# Patient Record
Sex: Female | Born: 1945 | Race: White | Hispanic: No | Smoking: Former smoker
Health system: Southern US, Community
[De-identification: ages and names within clinical notes are randomized; demographics above are authoritative.]

## PROBLEM LIST (undated history)

## (undated) DIAGNOSIS — K5792 Diverticulitis of intestine, part unspecified, without perforation or abscess without bleeding: Secondary | ICD-10-CM

## (undated) DIAGNOSIS — J302 Other seasonal allergic rhinitis: Secondary | ICD-10-CM

## (undated) DIAGNOSIS — I10 Essential (primary) hypertension: Secondary | ICD-10-CM

## (undated) DIAGNOSIS — F329 Major depressive disorder, single episode, unspecified: Secondary | ICD-10-CM

## (undated) DIAGNOSIS — K529 Noninfective gastroenteritis and colitis, unspecified: Secondary | ICD-10-CM

## (undated) DIAGNOSIS — F32A Depression, unspecified: Secondary | ICD-10-CM

## (undated) DIAGNOSIS — M1990 Unspecified osteoarthritis, unspecified site: Secondary | ICD-10-CM

## (undated) HISTORY — PX: CATARACT EXTRACTION, BILATERAL: SHX1313

## (undated) HISTORY — DX: Depression, unspecified: F32.A

## (undated) HISTORY — PX: ABDOMINAL HYSTERECTOMY: SHX81

## (undated) HISTORY — PX: CHOLECYSTECTOMY: SHX55

## (undated) HISTORY — DX: Diverticulitis of intestine, part unspecified, without perforation or abscess without bleeding: K57.92

## (undated) HISTORY — DX: Other seasonal allergic rhinitis: J30.2

## (undated) HISTORY — DX: Unspecified osteoarthritis, unspecified site: M19.90

## (undated) HISTORY — DX: Major depressive disorder, single episode, unspecified: F32.9

## (undated) HISTORY — PX: TONSILLECTOMY AND ADENOIDECTOMY: SHX28

---

## 2011-10-02 DIAGNOSIS — M150 Primary generalized (osteo)arthritis: Secondary | ICD-10-CM | POA: Insufficient documentation

## 2012-06-10 DIAGNOSIS — M8589 Other specified disorders of bone density and structure, multiple sites: Secondary | ICD-10-CM | POA: Insufficient documentation

## 2014-07-29 DIAGNOSIS — H2512 Age-related nuclear cataract, left eye: Secondary | ICD-10-CM | POA: Insufficient documentation

## 2016-09-01 HISTORY — PX: BACK SURGERY: SHX140

## 2017-01-31 ENCOUNTER — Emergency Department (HOSPITAL_BASED_OUTPATIENT_CLINIC_OR_DEPARTMENT_OTHER)
Admission: EM | Admit: 2017-01-31 | Discharge: 2017-02-01 | Disposition: A | Discharge status: Home / Self Care | Payer: Medicare Other | Attending: Emergency Medicine | Admitting: Emergency Medicine

## 2017-01-31 ENCOUNTER — Encounter (HOSPITAL_BASED_OUTPATIENT_CLINIC_OR_DEPARTMENT_OTHER): Payer: Self-pay

## 2017-01-31 DIAGNOSIS — I10 Essential (primary) hypertension: Secondary | ICD-10-CM | POA: Diagnosis not present

## 2017-01-31 DIAGNOSIS — Y9389 Activity, other specified: Secondary | ICD-10-CM | POA: Diagnosis not present

## 2017-01-31 DIAGNOSIS — Z23 Encounter for immunization: Secondary | ICD-10-CM | POA: Diagnosis not present

## 2017-01-31 DIAGNOSIS — F17200 Nicotine dependence, unspecified, uncomplicated: Secondary | ICD-10-CM | POA: Diagnosis not present

## 2017-01-31 DIAGNOSIS — W01198A Fall on same level from slipping, tripping and stumbling with subsequent striking against other object, initial encounter: Secondary | ICD-10-CM | POA: Diagnosis not present

## 2017-01-31 DIAGNOSIS — S0101XA Laceration without foreign body of scalp, initial encounter: Secondary | ICD-10-CM | POA: Diagnosis not present

## 2017-01-31 DIAGNOSIS — Y9289 Other specified places as the place of occurrence of the external cause: Secondary | ICD-10-CM | POA: Diagnosis not present

## 2017-01-31 DIAGNOSIS — Y999 Unspecified external cause status: Secondary | ICD-10-CM | POA: Diagnosis not present

## 2017-01-31 DIAGNOSIS — S0990XA Unspecified injury of head, initial encounter: Secondary | ICD-10-CM | POA: Diagnosis present

## 2017-01-31 HISTORY — DX: Essential (primary) hypertension: I10

## 2017-01-31 NOTE — ED Notes (Signed)
Bleeding controlled in triage 

## 2017-01-31 NOTE — ED Triage Notes (Signed)
Pt slipped/fell approx 945pm-hit back of head on dresser corner-lac noted-no bleeding-no LOC-presents to triage from w/c-NAD

## 2017-02-01 DIAGNOSIS — S0101XA Laceration without foreign body of scalp, initial encounter: Secondary | ICD-10-CM | POA: Diagnosis not present

## 2017-02-01 MED ORDER — TETANUS-DIPHTH-ACELL PERTUSSIS 5-2.5-18.5 LF-MCG/0.5 IM SUSP
0.5000 mL | Freq: Once | INTRAMUSCULAR | Status: AC
  Administered 2017-02-01: 0.5 mL via INTRAMUSCULAR
  Filled 2017-02-01: qty 0.5

## 2017-02-01 NOTE — ED Provider Notes (Signed)
[facility]   CSN: [id number] Arrival date & time: 01/31/17  2244     History   Chief Complaint Chief Complaint  Patient presents with  . Head Laceration    HPI Ana Johnston is a 71 y.o. female.  Patient is a 71 year old female with no significant past medical history presenting for evaluation of a fall. She reports living and falling earlier this evening hitting the back of her head on the corner of the nightstand. She has a laceration of the posterior scalp. She denies any loss of consciousness, neck pain or headache. She denies any other injury in the fall. Last tetanus shot is unknown.   The history is provided by the patient.  Head Laceration  This is a new problem. The current episode started 1 to 2 hours ago. The problem occurs constantly. The problem has not changed since onset.Pertinent negatives include no headaches. Nothing aggravates the symptoms. Nothing relieves the symptoms. She has tried nothing for the symptoms.    Past Medical History:  Diagnosis Date  . Hypertension     There are no active problems to display for this patient.   Past Surgical History:  Procedure Laterality Date  . ABDOMINAL HYSTERECTOMY    . BACK SURGERY    . CHOLECYSTECTOMY      OB History    No data available       Home Medications    Prior to Admission medications   Medication Sig Start Date End Date Taking? Authorizing Provider  UNKNOWN TO PATIENT BP MED, SLEEPING PILLS   Yes [provider]    Family History No family history on file.  Social History Social History  Substance Use Topics  . Smoking status: Current Every Day Smoker  . Smokeless tobacco: Never Used  . Alcohol use Yes     Comment: occ     Allergies   Patient has no known allergies.   Review of Systems Review of Systems  Neurological: Negative for headaches.  All other systems reviewed and are negative.    Physical Exam Updated Vital Signs BP 128/85  (BP Location: Right Arm)   Pulse 78   Temp 97.8 F (36.6 C) (Oral)   Resp 14   Ht 5\' 1"  (1.549 m)   Wt 59 kg (130 lb)   SpO2 97%   BMI 24.56 kg/m   Physical Exam  Constitutional: She is oriented to person, place, and time. She appears well-developed and well-nourished. No distress.  HENT:  Head: Atraumatic.  Mouth/Throat: Oropharynx is clear and moist.  There is a 2.5 cm laceration to the occipital region just to the left of the occiput.  Eyes: Pupils are equal, round, and reactive to light. EOM are normal.  Neck: Normal range of motion. Neck supple.  There is no cervical spine tenderness or step-off. She has painless range of motion in all directions.  Pulmonary/Chest: Effort normal.  Musculoskeletal: Normal range of motion.  Neurological: She is alert and oriented to person, place, and time. No cranial nerve deficit. She exhibits normal muscle tone. Coordination normal.  Skin: Skin is warm and dry. She is not diaphoretic.  Nursing note and vitals reviewed.    ED Treatments / Results  Labs (all labs ordered are listed, but only abnormal results are displayed) Labs Reviewed - No data to display  EKG  EKG Interpretation None       Radiology No results found. LACERATION REPAIR Performed by: Geoffery LyonseLo, Kristapher [place] Authorized by: Geoffery LyonseLo, Jodi Kappes Consent:  Verbal consent obtained. Risks and benefits: risks, benefits and alternatives were discussed Consent given by: patient Patient identity confirmed: provided demographic data Prepped and Draped in normal sterile fashion Wound explored  Laceration Location: Posterior scalp  Laceration Length: 2.5 cm  No Foreign Bodies seen or palpated  Anesthesia: local infiltration  Local anesthetic: lidocaine 1 % without epinephrine  Anesthetic total: 2 ml  Irrigation method: syringe Amount of cleaning: standard  Skin closure: Staples   Number of sutures: 2   Technique: Staples   Patient tolerance: Patient tolerated the  procedure well with no immediate complications.  Procedures Procedures (including critical care time)  Medications Ordered in ED Medications  Tdap (BOOSTRIX) injection 0.5 mL (not administered)     Initial Impression / Assessment and Plan / ED Course  I have reviewed the triage vital signs and the nursing notes.  Pertinent labs & imaging results that were available during my care of the patient were reviewed by me and considered in my medical decision making (see chart for details).  Laceration repaired as described above. There is no loss of consciousness and no focal finding on neurologic exam. The patient takes no blood thinners. I see no indication for imaging. She will be discharged with local wound care and staple removal in 5 days.  Final Clinical Impressions(s) / ED Diagnoses   Final diagnoses:  None    New Prescriptions New Prescriptions   No medications on file     Geoffery Lyons, MD 02/01/17 0010

## 2017-02-01 NOTE — Discharge Instructions (Signed)
Follow-up with your primary Dr. in 5 days for staple removal.  Return to the emergency department in the meantime if you develop worsening headache, confusion, seizure activity, or other new and concerning symptoms.

## 2017-05-25 ENCOUNTER — Emergency Department (HOSPITAL_BASED_OUTPATIENT_CLINIC_OR_DEPARTMENT_OTHER): Payer: Medicare Other

## 2017-05-25 ENCOUNTER — Other Ambulatory Visit: Payer: Self-pay

## 2017-05-25 ENCOUNTER — Encounter (HOSPITAL_BASED_OUTPATIENT_CLINIC_OR_DEPARTMENT_OTHER): Payer: Self-pay

## 2017-05-25 ENCOUNTER — Emergency Department (HOSPITAL_BASED_OUTPATIENT_CLINIC_OR_DEPARTMENT_OTHER)
Admission: EM | Admit: 2017-05-25 | Discharge: 2017-05-25 | Disposition: A | Discharge status: Home / Self Care | Payer: Medicare Other | Attending: Emergency Medicine | Admitting: Emergency Medicine

## 2017-05-25 DIAGNOSIS — I959 Hypotension, unspecified: Secondary | ICD-10-CM

## 2017-05-25 DIAGNOSIS — W19XXXA Unspecified fall, initial encounter: Secondary | ICD-10-CM

## 2017-05-25 DIAGNOSIS — W0110XA Fall on same level from slipping, tripping and stumbling with subsequent striking against unspecified object, initial encounter: Secondary | ICD-10-CM | POA: Insufficient documentation

## 2017-05-25 DIAGNOSIS — Y92003 Bedroom of unspecified non-institutional (private) residence as the place of occurrence of the external cause: Secondary | ICD-10-CM | POA: Diagnosis not present

## 2017-05-25 DIAGNOSIS — Y939 Activity, unspecified: Secondary | ICD-10-CM | POA: Diagnosis not present

## 2017-05-25 DIAGNOSIS — S01111A Laceration without foreign body of right eyelid and periocular area, initial encounter: Secondary | ICD-10-CM | POA: Insufficient documentation

## 2017-05-25 DIAGNOSIS — Y999 Unspecified external cause status: Secondary | ICD-10-CM | POA: Insufficient documentation

## 2017-05-25 DIAGNOSIS — I10 Essential (primary) hypertension: Secondary | ICD-10-CM | POA: Diagnosis not present

## 2017-05-25 DIAGNOSIS — S0990XA Unspecified injury of head, initial encounter: Secondary | ICD-10-CM

## 2017-05-25 DIAGNOSIS — F17200 Nicotine dependence, unspecified, uncomplicated: Secondary | ICD-10-CM | POA: Diagnosis not present

## 2017-05-25 LAB — COMPREHENSIVE METABOLIC PANEL
ALT: 29 U/L (ref 14–54)
ANION GAP: 14 (ref 5–15)
AST: 37 U/L (ref 15–41)
Albumin: 4.1 g/dL (ref 3.5–5.0)
Alkaline Phosphatase: 91 U/L (ref 38–126)
BILIRUBIN TOTAL: 0.5 mg/dL (ref 0.3–1.2)
BUN: 11 mg/dL (ref 6–20)
CHLORIDE: 105 mmol/L (ref 101–111)
CO2: 20 mmol/L — ABNORMAL LOW (ref 22–32)
Calcium: 9.4 mg/dL (ref 8.9–10.3)
Creatinine, Ser: 0.81 mg/dL (ref 0.44–1.00)
Glucose, Bld: 84 mg/dL (ref 65–99)
POTASSIUM: 3.6 mmol/L (ref 3.5–5.1)
Sodium: 139 mmol/L (ref 135–145)
TOTAL PROTEIN: 7.1 g/dL (ref 6.5–8.1)

## 2017-05-25 LAB — DIFFERENTIAL
BASOS ABS: 0 10*3/uL (ref 0.0–0.1)
Basophils Relative: 1 %
EOS ABS: 0.2 10*3/uL (ref 0.0–0.7)
Eosinophils Relative: 3 %
LYMPHS ABS: 2.4 10*3/uL (ref 0.7–4.0)
Lymphocytes Relative: 38 %
Monocytes Absolute: 0.6 10*3/uL (ref 0.1–1.0)
Monocytes Relative: 10 %
NEUTROS ABS: 3 10*3/uL (ref 1.7–7.7)
NEUTROS PCT: 48 %

## 2017-05-25 LAB — CBC
HCT: 40.4 % (ref 36.0–46.0)
HEMOGLOBIN: 13.9 g/dL (ref 12.0–15.0)
MCH: 33.9 pg (ref 26.0–34.0)
MCHC: 34.4 g/dL (ref 30.0–36.0)
MCV: 98.5 fL (ref 78.0–100.0)
Platelets: 262 10*3/uL (ref 150–400)
RBC: 4.1 MIL/uL (ref 3.87–5.11)
RDW: 13.1 % (ref 11.5–15.5)
WBC: 6.2 10*3/uL (ref 4.0–10.5)

## 2017-05-25 LAB — URINALYSIS, ROUTINE W REFLEX MICROSCOPIC
Bilirubin Urine: NEGATIVE
Glucose, UA: NEGATIVE mg/dL
HGB URINE DIPSTICK: NEGATIVE
Ketones, ur: NEGATIVE mg/dL
LEUKOCYTES UA: NEGATIVE
NITRITE: NEGATIVE
PROTEIN: NEGATIVE mg/dL
pH: 6 (ref 5.0–8.0)

## 2017-05-25 LAB — TROPONIN I

## 2017-05-25 MED ORDER — LOPERAMIDE HCL 2 MG PO CAPS: 2.0000 mg | ORAL_CAPSULE | Freq: Four times a day (QID) | ORAL | 0 refills | Status: DC | PRN

## 2017-05-25 MED ORDER — SODIUM CHLORIDE 0.9 % IV BOLUS (SEPSIS)
1000.0000 mL | Freq: Once | INTRAVENOUS | Status: AC
  Administered 2017-05-25: 1000 mL via INTRAVENOUS

## 2017-05-25 MED ORDER — LIDOCAINE HCL (PF) 1 % IJ SOLN
5.0000 mL | Freq: Once | INTRAMUSCULAR | Status: AC
  Administered 2017-05-25: 5 mL
  Filled 2017-05-25: qty 5

## 2017-05-25 NOTE — ED Notes (Signed)
Patient transported to CT 

## 2017-05-25 NOTE — ED Provider Notes (Signed)
Emergency Department Provider Note   I have reviewed the triage vital signs and the nursing notes.   HISTORY  Chief Complaint Fall   HPI Ana Johnston is a 71 y.o. female with PMH of HTN presents to the emergency department for evaluation after mechanical fall with head and face trauma.  Patient had taken some of her nighttime medications including melatonin, trazodone, gabapentin.  She was bending over to pick up her small dog when she lost her balance and fell to the side.  States that she struck her face and head on the nearby bed.  She sustained a laceration to the area with questionable loss of consciousness.  Patient denies any pain in the arms or legs.  She has been feeling fatigued for most of the day and has had some return of diarrhea symptoms over the past 2 days.  Patient states she has a history of colitis and sometimes get flares of diarrhea.  Again, she denies pain in the abdomen, nausea, vomiting. No other recent medication changes.   Caregiver also notes that the patient has been accidentally taking too much Melatonin along with her other sleeping medications.    Past Medical History:  Diagnosis Date  . Hypertension     There are no active problems to display for this patient.   Past Surgical History:  Procedure Laterality Date  . ABDOMINAL HYSTERECTOMY    . BACK SURGERY    . CHOLECYSTECTOMY      Current Outpatient Rx  . Order #: [id number] Class: Historical Med  . Order #: [id number] Class: Historical Med  . Order #: [id number] Class: Historical Med  . Order #: [id number] Class: Historical Med  . Order #: [id number] Class: Historical Med  . Order #: [id number] Class: Historical Med  . Order #: [id number] Class: Historical Med  . Order #: [id number] Class: Historical Med  . Order #: [id number] Class: Print  . Order #: [id number] Class: Historical Med    Allergies Patient has no known allergies.  History reviewed. No pertinent family history.  Social History Social  History   Tobacco Use  . Smoking status: Current Every Day Smoker  . Smokeless tobacco: Never Used  Substance Use Topics  . Alcohol use: Yes    Comment: occ  . Drug use: No    Review of Systems  Constitutional: No fever/chills. Positive drowsiness today.  Eyes: No visual changes. ENT: No sore throat. Cardiovascular: Denies chest pain. Respiratory: Denies shortness of breath. Gastrointestinal: No abdominal pain.  No nausea, no vomiting.  No diarrhea.  No constipation. Genitourinary: Negative for dysuria. Musculoskeletal: Negative for back pain. Skin: Negative for rash. Laceration to the right forehead. Bruising to the right cheek.  Neurological: Negative for headaches, focal weakness or numbness.  10-point ROS otherwise negative.  ____________________________________________   PHYSICAL EXAM:  VITAL SIGNS: ED Triage Vitals  Enc Vitals Group     BP 05/25/17 1930 (!) 86/64     Pulse Rate 05/25/17 1930 64     Resp 05/25/17 1930 18     Temp 05/25/17 1930 (!) 97.5 F (36.4 C)     Temp Source 05/25/17 1930 Oral     SpO2 05/25/17 1930 98 %     Pain Score 05/25/17 1931 6   Constitutional: Alert and oriented. Well appearing and in no acute distress. Eyes: Conjunctivae are normal. PERRL. EOMI. Head: Right forehead/eyebrow laceration. No scalp hematoma or addition injury.  Nose: No congestion/rhinnorhea. Mouth/Throat: Mucous membranes are moist.  Oropharynx non-erythematous. No dental injury noted.  Neck: No stridor. No cervical  spine tenderness to palpation. Cardiovascular: Normal rate, regular rhythm. Good peripheral circulation. Grossly normal heart sounds.   Respiratory: Normal respiratory effort.  No retractions. Lungs CTAB. Gastrointestinal: Soft and nontender. No distention.  Musculoskeletal: No lower extremity tenderness nor edema. No gross deformities of extremities. Neurologic:  Normal speech and language. No gross focal neurologic deficits are appreciated.  Skin:   Skin is warm and dry. Laceration and ecchymosis to the right cheek noted above.    ____________________________________________   LABS (all labs ordered are listed, but only abnormal results are displayed)  Labs Reviewed  COMPREHENSIVE METABOLIC PANEL - Abnormal; Notable for the following components:      Result Value   CO2 20 (*)    All other components within normal limits  URINALYSIS, ROUTINE W REFLEX MICROSCOPIC - Abnormal; Notable for the following components:   Specific Gravity, Urine <1.005 (*)    All other components within normal limits  TROPONIN I  CBC  DIFFERENTIAL  CBC WITH DIFFERENTIAL/PLATELET   ____________________________________________  EKG   EKG Interpretation  Date/Time:  Sunday May 25 2017 20:01:28 EST Ventricular Rate:  71 PR Interval:    QRS Duration: 99 QT Interval:  476 QTC Calculation: 518 R Axis:   -25 Text Interpretation:  Sinus rhythm Anterior infarct, old Prolonged QT interval No STEMI.  Confirmed by Alona Bene [phone]) on 05/25/2017 8:03:21 PM Also confirmed by Alona Bene [phone]), editor Elita Quick [phone])  on 05/26/2017 6:50:46 AM       ____________________________________________  RADIOLOGY  Ct Head Wo Contrast  Result Date: 05/25/2017 CLINICAL DATA:  Larey Seat out of bed injuring right-sided head and face. EXAM: CT HEAD WITHOUT CONTRAST CT MAXILLOFACIAL WITHOUT CONTRAST TECHNIQUE: Multidetector CT imaging of the head and maxillofacial structures were performed using the standard protocol without intravenous contrast. Multiplanar CT image reconstructions of the maxillofacial structures were also generated. COMPARISON:  None. FINDINGS: CT HEAD FINDINGS Brain: No evidence of acute infarction, hemorrhage, hydrocephalus, extra-axial collection or mass lesion/mass effect. Vascular: No hyperdense vessel or unexpected calcification. Skull: Normal. Negative for fracture or focal lesion. Other: None. CT MAXILLOFACIAL FINDINGS Osseous:  No acute fracture. Orbits: Negative. No traumatic or inflammatory finding. Sinuses: Clear.  Mild deviation of the nasal septum to the left. Soft tissues: Mild soft tissue swelling over the anterolateral right mid face. IMPRESSION: No acute brain injury. No acute facial bone fracture. Minimal soft tissue swelling over the anterolateral right mid face. Electronically Signed   By: Elberta Fortis M.D.   On: 05/25/2017 20:33   Ct Maxillofacial Wo Contrast  Result Date: 05/25/2017 CLINICAL DATA:  Larey Seat out of bed injuring right-sided head and face. EXAM: CT HEAD WITHOUT CONTRAST CT MAXILLOFACIAL WITHOUT CONTRAST TECHNIQUE: Multidetector CT imaging of the head and maxillofacial structures were performed using the standard protocol without intravenous contrast. Multiplanar CT image reconstructions of the maxillofacial structures were also generated. COMPARISON:  None. FINDINGS: CT HEAD FINDINGS Brain: No evidence of acute infarction, hemorrhage, hydrocephalus, extra-axial collection or mass lesion/mass effect. Vascular: No hyperdense vessel or unexpected calcification. Skull: Normal. Negative for fracture or focal lesion. Other: None. CT MAXILLOFACIAL FINDINGS Osseous: No acute fracture. Orbits: Negative. No traumatic or inflammatory finding. Sinuses: Clear.  Mild deviation of the nasal septum to the left. Soft tissues: Mild soft tissue swelling over the anterolateral right mid face. IMPRESSION: No acute brain injury. No acute facial bone fracture. Minimal soft tissue swelling over the anterolateral right mid face. Electronically Signed   By: Elberta Fortis M.D.   On: 05/25/2017  20:33    ____________________________________________   PROCEDURES  Procedure(s) performed:   Marland Kitchen.Marland Kitchen.Laceration Repair Date/Time: 05/26/2017 9:14 AM Performed by: Maia PlanLong, Jovie Swanner G, MD Authorized by: Maia PlanLong, Galo Sayed G, MD   Consent:    Consent obtained:  Verbal   Consent given by:  Patient   Risks discussed:  Infection, need for additional  repair, nerve damage, poor wound healing, poor cosmetic result, pain, retained foreign body, tendon damage and vascular damage   Alternatives discussed:  No treatment Anesthesia (see MAR for exact dosages):    Anesthesia method:  Local infiltration   Local anesthetic:  Lidocaine 1% w/o epi Laceration details:    Location:  Face   Face location:  R eyebrow   Length (cm):  3 Repair type:    Repair type:  Simple Pre-procedure details:    Preparation:  Patient was prepped and draped in usual sterile fashion and imaging obtained to evaluate for foreign bodies Exploration:    Hemostasis achieved with:  Direct pressure   Wound exploration: entire depth of wound probed and visualized     Wound extent: no foreign bodies/material noted, no muscle damage noted, no nerve damage noted, no underlying fracture noted and no vascular damage noted     Contaminated: no   Treatment:    Area cleansed with:  Betadine and saline   Amount of cleaning:  Standard   Visualized foreign bodies/material removed: no   Skin repair:    Repair method:  Sutures   Suture size:  5-0   Suture material:  Prolene   Suture technique:  Simple interrupted   Number of sutures:  5 Approximation:    Approximation:  Close   Vermilion border: well-aligned   Post-procedure details:    Dressing:  Open (no dressing)   Patient tolerance of procedure:  Tolerated well, no immediate complications     ____________________________________________   INITIAL IMPRESSION / ASSESSMENT AND PLAN / ED COURSE  Pertinent labs & imaging results that were available during my care of the patient were reviewed by me and considered in my medical decision making (see chart for details).  Patient presents emergency department for evaluation after mechanical fall.  She has a small laceration over the right eyebrow.  There was questionable loss of consciousness.  Patient also with some bruising over the right cheek.  Been feeling fatigued for  most the day and her initial blood pressure is low.  Repeat blood pressure without intervention shows improved BP. Plan for IVF, labs, UA, and CT imaging.   BP improving with IVF. Suspect mild dehydration with diarrhea returning. Will start immodium to control symptoms. No abdominal pain or tenderness on exam to require additional imaging. Laceration repaired as above. Plan for return for suture removal in 5-7 days.   At this time, I do not feel there is any life-threatening condition present. I have reviewed and discussed all results (EKG, imaging, lab, urine as appropriate), exam findings with patient. I have reviewed nursing notes and appropriate previous records.  I feel the patient is safe to be discharged home without further emergent workup. Discussed usual and customary return precautions. Patient and family (if present) verbalize understanding and are comfortable with this plan.  Patient will follow-up with their primary care provider. If they do not have a primary care provider, information for follow-up has been provided to them. All questions have been answered.  ____________________________________________  FINAL CLINICAL IMPRESSION(S) / ED DIAGNOSES  Final diagnoses:  Fall, initial encounter  Injury of head, initial encounter  Hypotension, unspecified hypotension type     MEDICATIONS GIVEN DURING THIS VISIT:  Medications  sodium chloride 0.9 % bolus 1,000 mL (0 mLs Intravenous Stopped 05/25/17 2143)  lidocaine (PF) (XYLOCAINE) 1 % injection 5 mL (5 mLs Infiltration Given 05/25/17 2024)     NEW OUTPATIENT MEDICATIONS STARTED DURING THIS VISIT:  Immodium   Note:  This document was prepared using Dragon voice recognition software and may include unintentional dictation errors.  Alona Bene, MD Emergency Medicine    Aamilah Augenstein, Arlyss Repress, MD 05/26/17 [phone]

## 2017-05-25 NOTE — Discharge Instructions (Signed)
You were seen in the Emergency Department (ED) today for a head injury.  Based on your evaluation, you may have sustained a concussion (or bruise) to your brain.  If you had a CT scan done, it did not show any evidence of serious injury or bleeding.    Symptoms to expect from a concussion include nausea, mild to moderate headache, difficulty concentrating or sleeping, and mild lightheadedness.  These symptoms should improve over the next few days to weeks, but it may take many weeks before you feel back to normal.  Return to the emergency department or follow-up with your primary care doctor if your symptoms are not improving over this time.  Signs of a more serious head injury include vomiting, severe headache, excessive sleepiness or confusion, and weakness or numbness in your face, arms or legs.  Return immediately to the Emergency Department if you experience any of these more concerning symptoms.    Rest, avoid strenuous physical or mental activity, and avoid activities that could potentially result in another head injury until all your symptoms from this head injury are completely resolved for at least 2-3 weeks.  You may take ibuprofen or acetaminophen over the counter according to label instructions for mild headache or scalp soreness.  

## 2017-05-25 NOTE — ED Triage Notes (Signed)
Pt reports fall just PTA - laceration noted to right side of head/right facial discoloration - pt denies LOC, denies being on anticoagulation. PT states that she was trying to pick up her dog to place her dog into the bed when she lost her balance and fell forward. States she had already taken her medications for insomnia. Lives alone. No history of using ambulatory aides, but daughter states patient is always off balance. Pt is hypotensive during triage - reports she has been sleepy all day - denies dysuria, cough, or fever. States she took Melatonin 30mg , trazadone 100mg , and gabapentin 300mg  around 1800. Beeding controlled at this time.

## 2017-05-25 NOTE — ED Notes (Signed)
ED Provider at bedside. 

## 2017-06-02 ENCOUNTER — Other Ambulatory Visit: Payer: Self-pay | Admitting: Family Medicine

## 2017-06-24 ENCOUNTER — Encounter: Payer: Self-pay | Admitting: Neurology

## 2017-07-02 NOTE — Progress Notes (Signed)
Ana Johnston was seen today in the movement disorders clinic for neurologic consultation at the request of [name], Tor NettersSara M., MD.  The consultation is for the evaluation of fall and tremor.  The records that were made available to me were reviewed.  This patient is accompanied in the office by her son and daughter in law who supplements the history.  Patient was seen in the [facility] emergency room on May 25, 2017 after a mechanical fall.  She sustained a laceration to the right eyebrow, which required sutures.  She was bending over to pick her dog up when she sustained the fall.  Blood pressure in the emergency room was quite low and the patient thinks that that is probably the reason she fell.  Her blood pressure medications were subsequently decreased and patient states that she doesn't take them daily, and just prn.  Patient reports that tremor started after that.  She followed up with a nurse practitioner at [facility] and she started her on clonazepam for anxiety and nervousness.  She takes that at bedtime.  The patient subsequently saw Dr [name] and she was referred here.  She was also referred for MRI of the brain.  This was completed at [facility] on June 12, 2017.  I do not have films.  I do have a report.  This was reported to show mild small vessel disease, and she also had an MRI of the cervical spine the same date.  This demonstrated mild edema at the C3-C4 facet, likely degenerative in nature.  There were also effusions in the atlantooccipital joints, likely also degenerative.  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  There is multilevel degenerative changes.  There is no significant central canal stenosis per reports.  Again, I do not have films.     Tremor: Yes.    - started after fall  At rest or with activation?  With activation  When is it noted the most?  With writing/eating - intermittent per family  Fam hx of tremor?  No.  Located where?  Both hands  Affected by caffeine:   Unknown - doesn't drink caffeine  Affected by alcohol:  (2 - 10 oz glasses of wine per night) - daughter in law thinks decreases tremor but patient denies  Affected by stress:  Yes.    Affected by fatigue:  Possibly per daughter in law  Spills soup if on spoon:  Yes.    Spills glass of liquid if full:  May or may not    She denies burning in feet/legs.  She admits to falls.  She has not had a fall since 12/23.  No balance therapy.  Family thinks that deconditioning is big issue.  She is not diabetic.     ALLERGIES:  No Known Allergies  CURRENT MEDICATIONS:  Outpatient Encounter Medications as of 07/04/2017  Medication Sig  . amLODipine (NORVASC) 10 MG tablet Take 10 mg by mouth daily.  Marland Kitchen. buPROPion (WELLBUTRIN SR) 150 MG 12 hr tablet Take 150 mg by mouth 2 (two) times daily.  . calcium-vitamin D (OSCAL WITH D) 500-200 MG-UNIT tablet Take 1 tablet by mouth.  . Cholecalciferol (VITAMIN D3) 1000 units CHEW Chew by mouth.  . clonazePAM (KLONOPIN) 0.5 MG tablet Take 0.5 mg by mouth at bedtime.  . Melatonin 10 MG TABS Take 30 mg by mouth.  . montelukast (SINGULAIR) 10 MG tablet Take 10 mg by mouth daily.  . Omega-3 Fatty Acids (FISH OIL) 1200 MG CAPS  Take by mouth.  . TRAZODONE HCL PO Take 50 mg by mouth at bedtime.   Marland Kitchen venlafaxine (EFFEXOR) 75 MG tablet Take 75 mg by mouth daily.  . vitamin E 1000 UNIT capsule Take by mouth daily.  . [DISCONTINUED] gabapentin (NEURONTIN) 300 MG capsule Take 300 mg by mouth 3 (three) times daily.  . [DISCONTINUED] loperamide (IMODIUM) 2 MG capsule Take 1 capsule (2 mg total) by mouth 4 (four) times daily as needed for diarrhea or loose stools.  . [DISCONTINUED] UNKNOWN TO PATIENT BP MED, SLEEPING PILLS   No facility-administered encounter medications on file as of 07/04/2017.     PAST MEDICAL HISTORY:   Past Medical History:  Diagnosis Date  . Depression   . Hypertension   . Seasonal allergies     PAST SURGICAL HISTORY:   Past Surgical History:    Procedure Laterality Date  . ABDOMINAL HYSTERECTOMY    . BACK SURGERY  09/2016  . CATARACT EXTRACTION, BILATERAL    . CHOLECYSTECTOMY      SOCIAL HISTORY:   Social History   Socioeconomic History  . Marital status: Widowed    Spouse name: Not on file  . Number of children: Not on file  . Years of education: Not on file  . Highest education level: Not on file  Social Needs  . Financial resource strain: Not on file  . Food insecurity - worry: Not on file  . Food insecurity - inability: Not on file  . Transportation needs - medical: Not on file  . Transportation needs - non-medical: Not on file  Occupational History  . Not on file  Tobacco Use  . Smoking status: Current Every Day Smoker  . Smokeless tobacco: Never Used  . Tobacco comment: 1/2 ppd x 50+ years  Substance and Sexual Activity  . Alcohol use: Yes    Comment: 2 glasses of wine a day  . Drug use: No  . Sexual activity: Not on file  Other Topics Concern  . Not on file  Social History Narrative  . Not on file    FAMILY HISTORY:   Family Status  Relation Name Status  . Mother  Deceased  . Father  Deceased  . Sister  Alive  . Brother  Alive  . Son x2 Alive    ROS:  A complete 10 system review of systems was obtained and was unremarkable apart from what is mentioned above.  PHYSICAL EXAMINATION:    VITALS:   Vitals:   07/04/17 0948  BP: (!) 142/90  Pulse: 86  SpO2: 95%  Weight: 143 lb (64.9 kg)  Height: 5\' 1"  (1.549 m)    GEN:  The patient appears stated age and is in NAD. HEENT:  Normocephalic, atraumatic.  The mucous membranes are moist. The superficial temporal arteries are without ropiness or tenderness. CV:  RRR Lungs:  CTAB Neck/HEME:  There are no carotid bruits bilaterally.  Neurological examination:  Orientation: The patient is alert and oriented x3. Fund of knowledge is appropriate.  Recent and remote memory are intact.  Attention and concentration are normal.    Able to name  objects and repeat phrases. Cranial nerves: There is good facial symmetry. Pupils are equal round and reactive to light bilaterally. Fundoscopic exam reveals clear margins bilaterally. Extraocular muscles are intact. The visual fields are full to confrontational testing. The speech is fluent and clear. Soft palate rises symmetrically and there is no tongue deviation. Hearing is intact to conversational tone. Sensation: Sensation is intact  to light and pinprick throughout (facial, trunk, extremities).  She reports decreased pinprick in a stocking distribution.  She reports initially that vibration is stronger in the big toe than it is in the hands, but then reports that it dissipates faster in the foot than the hand.  There is no extinction with double simultaneous stimulation. There is no sensory dermatomal level identified. Motor: Strength is 5/5 in the bilateral upper and lower extremities.   Shoulder shrug is equal and symmetric.  There is no pronator drift. Deep tendon reflexes: Deep tendon reflexes are 2/4 at the bilateral biceps, triceps, brachioradialis, patella and achilles. Plantar responses are downgoing bilaterally.  Movement examination: Tone: There is normal tone in the bilateral upper extremities.  The tone in the lower extremities is normal.  Abnormal movements: There is no rest tremor.  There is no postural tremor.  There is no tremor with intention.  When given a weight, she has some tremor in the left hand, but it is somewhat nonphysiologic.  When asked to pour water from one glass to another, she also has a little bit of tremor, but is able to complete the task.  She has some difficulty with Archimedes spirals, but very little of that difficulty was because of true tremor. Coordination:  There is no decremation with RAM's, with any form of RAMS, including alternating supination and pronation of the forearm, hand opening and closing, finger taps, heel taps and toe taps.  She has trouble  with finger-nose-finger with the eyes closed, but consistently hits the same place on her eye despite repeated attempts with eyes closed.  With eyes open, she is able to hit the tip of the nose. Gait and Station: The patient is able to arise out of the chair without the use of her hands.  She walks normally down the hall.  She is unable to ambulate in a tandem fashion.  LABS:  Reviewed labs in Care everywhere Labs were done on June 12, 2017.  Sodium was 139, potassium 4.3, chloride 103, CO2 28, BUN 13, creatinine 0.85, glucose 93 (prior 134).  White blood cells were 6.8, hemoglobin 15.0, hematocrit 43.9 and platelets 246.  MCV was elevated at 99.4.  MCH was elevated at 33.9.  ASSESSMENT/PLAN:  1.  Tremor  -There is very little tremor on her examination today and when I did see tremor when given a weight, some of it appeared nonphysiologic.  She reported that tremor started acutely after a fall in December.  I reassured her I saw no evidence of Parkinson's disease.  I also told her that I would not recommend any medication for her for the treatment of tremor.  I do wonder if anxiety plays a significant role, and her family does not disagree.  I also wonder if there is a component of alcohol/alcohol withdrawal that we are seeing.  There is a complex effect that alcohol can have on tremor.  Chronic alcohol use can produce a tremor but discontinuation of the alcohol can also cause a tremulous state for quite some time.  We talked about the importance of weaning alcohol under medical supervision.   2.  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  We discussed safety associated with peripheral neuropathy.  We discussed balance therapy and she was willing to go.  -We will do labs to r/o reversible causes of peripheral neuropathy.  I do think alcohol plays a  role here in etiology.  3.   Cervical degenerative change  -I do not have her films and could only comment on the report.  Do not think that this is likely surgical in nature, but offered her a surgical opinion (denied).  She certainly has reasons to have neck pain, however.  4.  Tobacco abuse  -Currently smoking 1/2 packs/day    - Patient was informed of the dangers of tobacco abuse including stroke, cancer, and MI, as well as benefits of tobacco cessation.  - Patient trying to quit at this time.  - Approximately 3 mins were spent counseling patient cessation techniques, independent of other visit time. We discussed various methods to help quit smoking, including deciding on a date to quit, joining a support group, pharmacological agents  - Pt following with PCP to discuss progress since won't be following here regularly  5.  F/u prn.  Much greater than 50% of this visit was spent in counseling and coordinating care.  Total face to face time:  60 min    Cc:  Laqueta Due., MD

## 2017-07-04 ENCOUNTER — Ambulatory Visit (INDEPENDENT_AMBULATORY_CARE_PROVIDER_SITE_OTHER): Payer: Medicare Other | Admitting: Neurology

## 2017-07-04 ENCOUNTER — Other Ambulatory Visit: Payer: Medicare Other

## 2017-07-04 ENCOUNTER — Encounter: Payer: Self-pay | Admitting: Neurology

## 2017-07-04 VITALS — BP 142/90 | HR 86 | Ht 61.0 in | Wt 143.0 lb

## 2017-07-04 DIAGNOSIS — R251 Tremor, unspecified: Secondary | ICD-10-CM

## 2017-07-04 DIAGNOSIS — G629 Polyneuropathy, unspecified: Secondary | ICD-10-CM

## 2017-07-04 DIAGNOSIS — F1010 Alcohol abuse, uncomplicated: Secondary | ICD-10-CM

## 2017-07-04 DIAGNOSIS — D7589 Other specified diseases of blood and blood-forming organs: Secondary | ICD-10-CM

## 2017-07-04 DIAGNOSIS — F17210 Nicotine dependence, cigarettes, uncomplicated: Secondary | ICD-10-CM

## 2017-07-04 DIAGNOSIS — Z720 Tobacco use: Secondary | ICD-10-CM

## 2017-07-04 LAB — TSH: TSH: 1.44 m[IU]/L (ref 0.40–4.50)

## 2017-07-04 NOTE — Patient Instructions (Signed)
1. Your provider has requested that you have labwork completed today. Please go to [facility] ([id number]) on the second floor of this building before leaving the office today. You do not need to check in. If you are not called within 15 minutes please check with the front desk.   2. You have been referred to Physical Therapy. They will call you directly to schedule an appointment.  Please [phone] if you do not hear from them.

## 2017-07-09 LAB — PROTEIN ELECTROPHORESIS,RANDOM URN
ALBUMIN UR 24 HR ELECTRO: 30 %
Alpha-1-Globulin, U: 6 %
Alpha-2-Globulin, U: 19 %
Beta Globulin, U: 26 %
Creatinine, Urine: 114 mg/dL (ref 20–275)
Gamma Globulin, U: 20 %
Protein/Creat Ratio: 123 mg/g creat (ref 21–161)
Total Protein, Urine: 14 mg/dL (ref 5–24)

## 2017-07-09 LAB — PROTEIN ELECTROPHORESIS, SERUM
Albumin ELP: 3.9 g/dL (ref 3.8–4.8)
Alpha 1: 0.3 g/dL (ref 0.2–0.3)
Alpha 2: 0.8 g/dL (ref 0.5–0.9)
BETA 2: 0.3 g/dL (ref 0.2–0.5)
BETA GLOBULIN: 0.4 g/dL (ref 0.4–0.6)
GAMMA GLOBULIN: 0.8 g/dL (ref 0.8–1.7)
TOTAL PROTEIN: 6.5 g/dL (ref 6.1–8.1)

## 2017-07-09 LAB — IMMUNOFIXATION ELECTROPHORESIS
IGG (IMMUNOGLOBIN G), SERUM: 743 mg/dL (ref 694–1618)
IMMUNOFIX ELECTR INT: NOT DETECTED
IMMUNOGLOBULIN A: 212 mg/dL (ref 81–463)
IgM, Serum: 62 mg/dL (ref 48–271)

## 2017-07-09 LAB — VITAMIN B1: Vitamin B1 (Thiamine): <6 nmol/L — ABNORMAL LOW (ref 8–30)

## 2017-07-09 LAB — FOLATE: Folate: 5.9 ng/mL

## 2017-07-09 LAB — VITAMIN B12: VITAMIN B 12: 396 pg/mL (ref 200–1100)

## 2017-07-09 LAB — IMMUNOFIXATION INTE

## 2017-07-10 ENCOUNTER — Telehealth: Payer: Self-pay | Admitting: Neurology

## 2017-07-10 NOTE — Telephone Encounter (Signed)
Patient made aware.

## 2017-07-10 NOTE — Telephone Encounter (Signed)
-----   Message from Octaviano Battyebecca S Tat, DO sent at 07/10/2017  7:19 AM EST ----- Let pt know that thiamine is low (often from drinking EtOH).  Take supplement 100mg  daily

## 2017-07-15 ENCOUNTER — Ambulatory Visit: Payer: Medicare Other | Attending: Neurology | Admitting: Physical Therapy

## 2017-07-15 ENCOUNTER — Encounter: Payer: Self-pay | Admitting: Physical Therapy

## 2017-07-15 ENCOUNTER — Other Ambulatory Visit: Payer: Self-pay

## 2017-07-15 DIAGNOSIS — M6281 Muscle weakness (generalized): Secondary | ICD-10-CM | POA: Diagnosis present

## 2017-07-15 DIAGNOSIS — R2689 Other abnormalities of gait and mobility: Secondary | ICD-10-CM | POA: Diagnosis present

## 2017-07-15 DIAGNOSIS — R262 Difficulty in walking, not elsewhere classified: Secondary | ICD-10-CM

## 2017-07-15 NOTE — Therapy (Signed)
During this treatment session, the therapist was present, participating in and directing the treatment. [facility] [address] [place], [place], [address] Phone: [phone]   Fax:  [phone]  Physical Therapy Evaluation  Patient Details  Name: Ana Johnston MRN: [id number] Date of Birth: 1946-03-12 Referring Provider: Tat   Encounter Date: 07/15/2017  PT End of Session - 07/15/17 1130    Visit Number  1    Date for PT Re-Evaluation  09/12/17    PT Start Time  1045    PT Stop Time  1130    PT Time Calculation (min)  45 min    Activity Tolerance  Treatment limited secondary to medical complications (Comment)    Behavior During Therapy  [facility] for tasks assessed/performed       Past Medical History:  Diagnosis Date  . Depression   . Hypertension   . Seasonal allergies     Past Surgical History:  Procedure Laterality Date  . ABDOMINAL HYSTERECTOMY    . BACK SURGERY  09/2016  . CATARACT EXTRACTION, BILATERAL    . CHOLECYSTECTOMY      There were no vitals filed for this visit.   Subjective Assessment - 07/15/17 1047    Subjective  Pt. reports having a history of falls over the past two years but have become more recurrent recently. Pt. son reports pt. falling at least twice over the past 3 months. Pt. most recent fall was around Christmas, pt. went to pick dog up and had fallen and hit head on the side of the bed. Pt. history of fallls with injury as result hitting head off of the ground of side furniture, black eye, broke back. Pt. reports tremors starting after last fall but son thinks it's due to being taken off of a medication that she shouldn't have been taking initially. Pt. reports getting "dizzy spells" and describes that as being light headed and feeling like you're going to faint - pt. doesn't get this feeling consistently.     Patient is accompained by:  Family member    Limitations  Standing;Walking     Patient Stated Goals  increase balance and leg strength for independence    Currently in Pain?  Yes    Pain Score  0-No pain    Pain Onset  More than a month ago         [facility] PT Assessment - 07/15/17 0001      Assessment   Medical Diagnosis  balance and gait disturbance    Referring Provider  Tat    Prior Therapy  PT for vertigo in the past      Precautions   Precautions  None      Balance Screen   Has the patient fallen in the past 6 months  Yes    How many times?  4    Has the patient had a decrease in activity level because of a fear of falling?   Yes    Is the patient reluctant to leave their home because of a fear of falling?   No usually has son or son's wife with her      Home Environment   Additional Comments  2 steps going into the house (pt, has "difficulty if no railing to hang onto"), doesn't do any housework or anything due to an increase in LBP and unsteadiness, has trouble getting in and out of the shower due to having to lift legs ups  high, small dog at home      Prior Function   Level of Independence  Independent with basic ADLs    Vocation  Retired    Leisure  no normal exercise routine,       ROM / Strength   AROM / PROM / Strength  Strength      Strength   Strength Assessment Site  Knee;Hip;Ankle    Right/Left Hip  Right;Left    Right Hip Flexion  3+/5    Right Hip Extension  4-/5    Right Hip ABduction  3+/5    Right Hip ADduction  4-/5    Left Hip Flexion  3+/5    Left Hip Extension  4-/5    Left Hip ABduction  3+/5    Left Hip ADduction  4-/5    Right/Left Knee  Right;Left    Right Knee Flexion  3+/5    Right Knee Extension  4-/5    Left Knee Flexion  3+/5    Left Knee Extension  4-/5    Right/Left Ankle  Right;Left    Right Ankle Dorsiflexion  3+/5    Right Ankle Plantar Flexion  3+/5    Left Ankle Dorsiflexion  4-/5    Left Ankle Plantar Flexion  4-/5      Ambulation/Gait   Gait Comments  decrease stride length and cadence, decrease  hip/knee flexion, some shuffling especially with turns, has difficulty clearring feet sometimes particularly the R      Standardized Balance Assessment   Standardized Balance Assessment  Berg Balance Test;Timed Up and Go Test;Four Square Step Test;Five Times Sit to [employer] Test   Sit to Stand  Able to stand without using hands and stabilize independently    Standing Unsupported  Able to stand 2 minutes with supervision some swaying    Sitting with Back Unsupported but Feet Supported on Floor or Stool  Able to sit safely and securely 2 minutes can sit with back unsupported but increases LBP    Stand to Sit  Controls descent by using hands    Transfers  Able to transfer safely, definite need of hands    Standing Unsupported with Eyes Closed  Able to stand 10 seconds safely some swaying    Standing Ubsupported with Feet Together  Able to place feet together independently and stand 1 minute safely    From Standing, Reach Forward with Outstretched Arm  Can reach confidently >25 cm (10")    From Standing Position, Pick up Object from Floor  Able to pick up shoe safely and easily    From Standing Position, Turn to Look Behind Over each Shoulder  Looks behind one side only/other side shows less weight shift needing cues to not just turn head to side and look behind    Turn 360 Degrees  Able to turn 360 degrees safely one side only in 4 seconds or less feeling unsteady and dizzy    Standing Unsupported, Alternately Place Feet on Step/Stool  Able to stand independently and complete 8 steps >20 seconds trouble picking up R foot high enough, needs guard    Standing Unsupported, One Foot in Front  Needs help to step but can hold 15 seconds difficulty putting L in front of R    Standing on One Leg  Able to lift leg independently and hold equal to or more than 3 seconds    Total Score  45      Timed  Up and Go Test   Normal TUG (seconds)  17             Objective measurements  completed on examination: See above findings.              PT Education - 07/15/17 1129    Education provided  Yes    Education Details  HEP standing marching, ABD, ext    [employer]) Educated  Patient;Other (comment) son    Methods  Explanation;Demonstration;Handout    Comprehension  Verbalized understanding       PT Short Term Goals - 07/15/17 1138      PT SHORT TERM GOAL #1   Title  independent with initial HEP    Time  2    Period  Weeks    Status  New        PT Long Term Goals - 07/15/17 1139      PT LONG TERM GOAL #1   Title  improve BERG >52 for safety with gait and functional gait    Baseline  45    Time  8    Period  Weeks    Status  New      PT LONG TERM GOAL #2   Title  increase overal LE strength 4+/5 for functional gait    Time  8    Period  Weeks    Status  New      PT LONG TERM GOAL #3   Title  decrease TUG time to 13 seconds    Baseline  19 seconds    Time  8    Period  Weeks    Status  New      PT LONG TERM GOAL #4   Title  report no difficulty or limitation with ADLs    Time  8    Period  Weeks    Status  New             Plan - 07/15/17 1130    Clinical Impression Statement  Pt. has had about 4 falls in the past 6 months and increased feelings of being unsteady or 'dizzy'. Pt. has 'dizzy' spells when she feels light headed often but not every day or during a particular time/activity. Pt. has to sit down to don/doff clothing or groom and needs assistance to get in and out of the shower due to step going into the shower and feeling like she will fall on the smooth surface. Pt. has some fear of falling again and getting hurt but doesn't fear leaving home because her son or his wife is always with her. Pt. has noticable weakness of hips/knee/ankle DF and PF.  Pt. BERG=45 and TUG =19 seconds. Pt. has decrease stride length and cadence, decrease hip/knee flexion and has some difficulty clearing feet when walking particularly the R foot.  Pt., shuffles a little especially iwth turns and turns very slowly. Pt. has great difficulty with decreasing BOS with standing.    Clinical Presentation  Evolving    Clinical Decision Making  Low    Rehab Potential  Good    PT Frequency  2x / week    PT Duration  8 weeks    PT Treatment/Interventions  Gait training;Stair training;Therapeutic activities;Therapeutic exercise;Balance training;Patient/family education;Neuromuscular re-education    PT Next Visit Plan  start general LE strengthening    PT Home Exercise Plan  standing marching, hip ABD, hip ext.    Consulted and Agree with Plan of Care  Patient  Patient will benefit from skilled therapeutic intervention in order to improve the following deficits and impairments:  Abnormal gait, Impaired sensation, Decreased activity tolerance, Decreased endurance, Decreased strength, Difficulty walking, Decreased balance  Visit Diagnosis: Difficulty in walking, not elsewhere classified  Balance problem  Muscle weakness (generalized)     Problem List There are no active problems to display for this patient.   Blima Ledger SPT 07/15/2017, 11:44 AM  [facility] [address] [place], [place], [address] Phone: [phone]   Fax:  [phone]  Name: Ana Johnston MRN: [id number] Date of Birth: Mar 06, 1946

## 2017-07-22 ENCOUNTER — Ambulatory Visit: Payer: Medicare Other | Admitting: Physical Therapy

## 2017-07-22 DIAGNOSIS — R2689 Other abnormalities of gait and mobility: Secondary | ICD-10-CM

## 2017-07-22 DIAGNOSIS — R262 Difficulty in walking, not elsewhere classified: Secondary | ICD-10-CM | POA: Diagnosis not present

## 2017-07-22 DIAGNOSIS — M6281 Muscle weakness (generalized): Secondary | ICD-10-CM

## 2017-07-22 NOTE — Therapy (Signed)
[facility] [address] [place], [place], [address] Phone: [phone]   Fax:  [phone]  Physical Therapy Treatment  Patient Details  Name: Ana Johnston MRN: [id number] Date of Birth: [birth year] Referring Provider: Tat   Encounter Date: 07/22/2017  PT End of Session - 07/22/17 1353    Visit Number  2    Date for PT Re-Evaluation  09/12/17    PT Start Time  1310    PT Stop Time  1355    PT Time Calculation (min)  45 min       Past Medical History:  Diagnosis Date  . Depression   . Hypertension   . Seasonal allergies     Past Surgical History:  Procedure Laterality Date  . ABDOMINAL HYSTERECTOMY    . BACK SURGERY  09/2016  . CATARACT EXTRACTION, BILATERAL    . CHOLECYSTECTOMY      There were no vitals filed for this visit.  Subjective Assessment - 07/22/17 1313    Subjective  alittle shakey but okay today. family reports just joined [employer] and want to know what pt can safely do    Currently in Pain?  No/denies                      [facility] Adult PT Treatment/Exercise - 07/22/17 0001      High Level Balance   High Level Balance Activities  Side stepping;Braiding;Backward walking;Direction changes;Turns;Tandem walking;Marching forwards ball toss      Exercises   Exercises  Lumbar;Knee/Hip      Lumbar Exercises: Aerobic   Recumbent Bike  5 min    Nustep  L 5 5 min      Lumbar Exercises: Machines for Strengthening   Cybex Knee Extension  5# 2 sets 10    Cybex Knee Flexion  15 # 2 sets 10      Knee/Hip Exercises: Seated   Ball Squeeze  20    Clamshell with TheraBand  Green    Sit to [employer]  without UE support;10 reps             PT Education - 07/22/17 1330    Education provided  Yes    Education Details  knee ext,HS curl, hip abd/add plus BIKE at gym    Person(s) Educated  Patient;Child(ren)    Methods  Explanation;Demonstration;Verbal cues;Handout    Comprehension  Verbalized understanding;Returned demonstration       PT Short Term Goals - 07/22/17 1355      PT SHORT TERM GOAL #1   Title  independent with initial HEP    Status  Achieved        PT Long Term Goals - 07/15/17 1139      PT LONG TERM GOAL #1   Title  improve BERG >52 for safety with gait and functional gait    Baseline  45    Time  8    Period  Weeks    Status  New      PT LONG TERM GOAL #2   Title  increase overal LE strength 4+/5 for functional gait    Time  8    Period  Weeks    Status  New      PT LONG TERM GOAL #3   Title  decrease TUG time to 13 seconds    Baseline  19 seconds    Time  8    Period  Weeks  Status  New      PT LONG TERM GOAL #4   Title  report no difficulty or limitation with ADLs    Time  8    Period  Weeks    Status  New            Plan - 07/22/17 1353    Clinical Impression Statement  cuing needed with ex for correct tech and educ with family for gym. LOB with tandem so added to HEP. tolerated initiation of ex for balance and LE ex well    PT Treatment/Interventions  Gait training;Stair training;Therapeutic activities;Therapeutic exercise;Balance training;Patient/family education;Neuromuscular re-education    PT Next Visit Plan  progress general LE strengthening       Patient will benefit from skilled therapeutic intervention in order to improve the following deficits and impairments:  Abnormal gait, Impaired sensation, Decreased activity tolerance, Decreased endurance, Decreased strength, Difficulty walking, Decreased balance  Visit Diagnosis: Difficulty in walking, not elsewhere classified  Balance problem  Muscle weakness (generalized)     Problem List There are no active problems to display for this patient.   Naveya Ellerman,ANGIE PTA 07/22/2017, 1:56 PM  [facility] [address] [place], [place], [address] Phone: [phone]   Fax:   [phone]  Name: Ana Johnston MRN: [id number] Date of Birth: [birth year]

## 2017-07-25 ENCOUNTER — Ambulatory Visit: Payer: Medicare Other | Admitting: Physical Therapy

## 2017-07-25 DIAGNOSIS — R262 Difficulty in walking, not elsewhere classified: Secondary | ICD-10-CM

## 2017-07-25 DIAGNOSIS — R2689 Other abnormalities of gait and mobility: Secondary | ICD-10-CM

## 2017-07-25 DIAGNOSIS — M6281 Muscle weakness (generalized): Secondary | ICD-10-CM

## 2017-07-25 NOTE — Therapy (Signed)
[facility] [address] [place], [place], [address] Phone: [phone]   Fax:  [phone]  Physical Therapy Treatment  Patient Details  Name: Ana Johnston MRN: [id number] Date of Birth: 1946/04/26 Referring Provider: Tat   Encounter Date: 07/25/2017  PT End of Session - 07/25/17 1135    Visit Number  3    Date for PT Re-Evaluation  09/12/17    PT Start Time  1100    PT Stop Time  1140    PT Time Calculation (min)  40 min       Past Medical History:  Diagnosis Date  . Depression   . Hypertension   . Seasonal allergies     Past Surgical History:  Procedure Laterality Date  . ABDOMINAL HYSTERECTOMY    . BACK SURGERY  09/2016  . CATARACT EXTRACTION, BILATERAL    . CHOLECYSTECTOMY      There were no vitals filed for this visit.  Subjective Assessment - 07/25/17 1102    Subjective  went to gym 3 times and did well. no pain just soreness    Currently in Pain?  No/denies                      [facility] Adult PT Treatment/Exercise - 07/25/17 0001      High Level Balance   High Level Balance Activities  Backward walking;Direction changes;Tandem walking;Marching forwards;Negotitating around obstacles;Negotiating over obstacles ball toss and kick,toss on airex. ladder drills      Lumbar Exercises: Aerobic   Elliptical  I 7 R 4 3min    Nustep  L 5 6 min      Lumbar Exercises: Machines for Strengthening   Other Lumbar Machine Exercise  lat pull and seated row 15# 2 sets 10    Other Lumbar Machine Exercise  black tband ab pull 2 sets 10      Knee/Hip Exercises: Standing   Walking with Sports Cord  5 times fwd/back. 3 times side stepping 40#  mod A needed at times for LOB esp when coming back to RT     Other Standing Knee Exercises  8 inch alt step tap 20 times 2 sets               PT Short Term Goals - 07/22/17 1355      PT SHORT TERM GOAL #1   Title  independent with initial HEP    Status  Achieved        PT Long Term Goals - 07/15/17 1139      PT LONG TERM GOAL #1   Title  improve BERG >52 for safety with gait and functional gait    Baseline  45    Time  8    Period  Weeks    Status  New      PT LONG TERM GOAL #2   Title  increase overal LE strength 4+/5 for functional gait    Time  8    Period  Weeks    Status  New      PT LONG TERM GOAL #3   Title  decrease TUG time to 13 seconds    Baseline  19 seconds    Time  8    Period  Weeks    Status  New      PT LONG TERM GOAL #4   Title  report no difficulty or limitation with ADLs    Time  8    Period  Weeks    Status  New            Plan - 07/25/17 1135    Clinical Impression Statement  pt verb doing great at gym so we added a couplke more ex ( row/lat and abs) elliptical not rec as it was very fatiguing. Pt tolerated advanced balacne ex well until added resistance then Lob esp to RT.    PT Treatment/Interventions  Gait training;Stair training;Therapeutic activities;Therapeutic exercise;Balance training;Patient/family education;Neuromuscular re-education    PT Next Visit Plan  progress general LE strengthening and Balance       Patient will benefit from skilled therapeutic intervention in order to improve the following deficits and impairments:  Abnormal gait, Impaired sensation, Decreased activity tolerance, Decreased endurance, Decreased strength, Difficulty walking, Decreased balance  Visit Diagnosis: Difficulty in walking, not elsewhere classified  Balance problem  Muscle weakness (generalized)     Problem List There are no active problems to display for this patient.   Degan Hanser,ANGIE  PTA 07/25/2017, 11:37 AM  [facility] [address] [place], [place], [address] Phone: [phone]   Fax:  [phone]  Name: Lalena Salas MRN: [id number] Date of Birth: December 29, 1945

## 2017-07-29 ENCOUNTER — Ambulatory Visit: Payer: Medicare Other | Admitting: Physical Therapy

## 2017-07-29 DIAGNOSIS — M6281 Muscle weakness (generalized): Secondary | ICD-10-CM

## 2017-07-29 DIAGNOSIS — R262 Difficulty in walking, not elsewhere classified: Secondary | ICD-10-CM | POA: Diagnosis not present

## 2017-07-29 DIAGNOSIS — R2689 Other abnormalities of gait and mobility: Secondary | ICD-10-CM

## 2017-07-29 NOTE — Therapy (Signed)
[facility] [address] [place], [place], [address] Phone: [phone]   Fax:  [phone]  Physical Therapy Treatment  Patient Details  Name: Ana Johnston MRN: [id number] Date of Birth: 05/12/1946 Referring Provider: Tat   Encounter Date: 07/29/2017  PT End of Session - 07/29/17 1342    Visit Number  4    Date for PT Re-Evaluation  09/12/17    PT Start Time  1305    PT Stop Time  1352    PT Time Calculation (min)  47 min       Past Medical History:  Diagnosis Date  . Depression   . Hypertension   . Seasonal allergies     Past Surgical History:  Procedure Laterality Date  . ABDOMINAL HYSTERECTOMY    . BACK SURGERY  09/2016  . CATARACT EXTRACTION, BILATERAL    . CHOLECYSTECTOMY      There were no vitals filed for this visit.  Subjective Assessment - 07/29/17 1304    Subjective  doing well at the gym    Currently in Pain?  No/denies                      [facility] Adult PT Treatment/Exercise - 07/29/17 0001      High Level Balance   High Level Balance Activities  Negotitating around obstacles;Negotiating over obstacles ball toss and kicks      Lumbar Exercises: Aerobic   Elliptical  I 7 R 4    UBE (Upper Arm Bike)  L 3 2 fwd/2 back    Nustep  L 5 6 min end of session for stamina      Knee/Hip Exercises: Standing   Lateral Step Up  Both;10 reps;Hand Hold: 0;Step Height: 6" opp leg abd    Forward Step Up  Both;10 reps;Hand Hold: 0;Step Height: 6" opp leg ext    SLS with Vectors  airex    Walking with Sports Cord  5 times fwd/back. 3 times side stepping 40#  vector stance    Other Standing Knee Exercises  cone tapping    Other Standing Knee Exercises  BOSU balance               PT Short Term Goals - 07/22/17 1355      PT SHORT TERM GOAL #1   Title  independent with initial HEP    Status  Achieved        PT Long Term Goals - 07/15/17 1139      PT LONG TERM GOAL #1    Title  improve BERG >52 for safety with gait and functional gait    Baseline  45    Time  8    Period  Weeks    Status  New      PT LONG TERM GOAL #2   Title  increase overal LE strength 4+/5 for functional gait    Time  8    Period  Weeks    Status  New      PT LONG TERM GOAL #3   Title  decrease TUG time to 13 seconds    Baseline  19 seconds    Time  8    Period  Weeks    Status  New      PT LONG TERM GOAL #4   Title  report no difficulty or limitation with ADLs    Time  8    Period  Weeks    Status  New            Plan - 07/29/17 1342    Clinical Impression Statement  decreased balance with high level balance ex today, very difficult on BOSU d/t post lean, neg obstacles without looking down multi LOB. SIde stepping with resistance decreases balance as well as added step ups today . Pt needed assistance to prevent falls and cuing to speed an dcontrol of mvmts    PT Treatment/Interventions  Gait training;Stair training;Therapeutic activities;Therapeutic exercise;Balance training;Patient/family education;Neuromuscular re-education    PT Next Visit Plan  balance/gait/strength       Patient will benefit from skilled therapeutic intervention in order to improve the following deficits and impairments:  Abnormal gait, Impaired sensation, Decreased activity tolerance, Decreased endurance, Decreased strength, Difficulty walking, Decreased balance  Visit Diagnosis: Difficulty in walking, not elsewhere classified  Balance problem  Muscle weakness (generalized)     Problem List There are no active problems to display for this patient.   PAYSEUR,ANGIE PTA 07/29/2017, 1:46 PM  [facility] [address] [place], [place], [address] Phone: [phone]   Fax:  [phone]  Name: Trudee Griplice Mandile MRN: [id number] Date of Birth: [birth year]

## 2017-08-01 ENCOUNTER — Ambulatory Visit: Payer: Medicare Other | Attending: Neurology | Admitting: Physical Therapy

## 2017-08-01 ENCOUNTER — Encounter: Payer: Self-pay | Admitting: Physical Therapy

## 2017-08-01 DIAGNOSIS — M6281 Muscle weakness (generalized): Secondary | ICD-10-CM

## 2017-08-01 DIAGNOSIS — R262 Difficulty in walking, not elsewhere classified: Secondary | ICD-10-CM | POA: Insufficient documentation

## 2017-08-01 DIAGNOSIS — R2689 Other abnormalities of gait and mobility: Secondary | ICD-10-CM | POA: Insufficient documentation

## 2017-08-01 NOTE — Therapy (Signed)
During this treatment session, the therapist was present, participating in and directing the treatment. [facility] [address] [place], [place], [address] Phone: [phone]   Fax:  [phone]  Physical Therapy Treatment  Patient Details  Name: Ana Johnston MRN: [id number] Date of Birth: [birth year] Referring Provider: Tat   Encounter Date: 08/01/2017  PT End of Session - 08/01/17 1007    Visit Number  5    Date for PT Re-Evaluation  09/12/17    PT Start Time  1005    PT Stop Time  1050    PT Time Calculation (min)  45 min    Activity Tolerance  Patient tolerated treatment well    Behavior During Therapy  [facility] for tasks assessed/performed       Past Medical History:  Diagnosis Date  . Depression   . Hypertension   . Seasonal allergies     Past Surgical History:  Procedure Laterality Date  . ABDOMINAL HYSTERECTOMY    . BACK SURGERY  09/2016  . CATARACT EXTRACTION, BILATERAL    . CHOLECYSTECTOMY      There were no vitals filed for this visit.  Subjective Assessment - 08/01/17 1002    Subjective  Pt. reports being a little sore after last tx but no pain. Pt. continues to go to the gym with no problem or limitation.     Patient Stated Goals  increase balance and leg strength for independence    Currently in Pain?  No/denies    Pain Score  0-No pain                      [facility] Adult PT Treatment/Exercise - 08/01/17 0001      High Level Balance   High Level Balance Activities  Negotitating around obstacles;Negotiating over obstacles lots of guarding and HHA esp with walking on half noodle    High Level Balance Comments  standing on airex doing toe taps on 5 cones both LE, needs  HHA and lots of guarding       Lumbar Exercises: Aerobic   Elliptical  I 7 R 4 x    UBE (Upper Arm Bike)  L 3 2 fwd/2 back      Lumbar Exercises: Machines for Strengthening   Cybex Knee Extension  10# 2x10     Cybex Knee Flexion  20# 2x10    Leg Press  20# 10 reps, 40# 10 reps      Knee/Hip Exercises: Standing   Lateral Step Up  Both;10 reps on bosu, supervision, reaches out to wall for stability    Forward Step Up  Both;10 reps on bosu, supervision, reaches out to wall for stability    Walking with Sports Cord  40# fwd/back x5 sidestepping x3    Other Standing Knee Exercises  hip ABD and ext with red tband 2x10 each               PT Short Term Goals - 07/22/17 1355      PT SHORT TERM GOAL #1   Title  independent with initial HEP    Status  Achieved        PT Long Term Goals - 08/01/17 1052      PT LONG TERM GOAL #1   Title  improve BERG >52 for safety with gait and functional gait    Baseline  45    Time  8    Period  Weeks    Status  New      PT LONG TERM GOAL #2   Title  increase overal LE strength 4+/5 for functional gait    Time  8    Period  Weeks    Status  New      PT LONG TERM GOAL #3   Title  decrease TUG time to 13 seconds    Baseline  19 seconds    Time  8    Period  Weeks    Status  New      PT LONG TERM GOAL #4   Title  report no difficulty or limitation with ADLs    Time  8    Period  Weeks    Status  New            Plan - 08/01/17 1008    Clinical Impression Statement  Pt. tolerates all strengthening therex well. Pt. demonstrates weakness and decreased balance with higher level balace ex like negotiating over objects of changes in surface type or walking on half noodle. Definited need for guarding and HHA for walking on various surfaces and half noodle (tandem stance). Pt. needs HHA and guarding on all bosu and airex activities as well. Pt. has great difficulty with eccentric control with side stepping. Pt. need lots some assistance controling with side step with sport cord. Pt. very fatigued by end of session.    Rehab Potential  Good    PT Frequency  2x / week    PT Duration  8 weeks    PT Treatment/Interventions  Gait training;Stair  training;Therapeutic activities;Therapeutic exercise;Balance training;Patient/family education;Neuromuscular re-education    PT Next Visit Plan  balance/gait/strength, reassess TUG    Consulted and Agree with Plan of Care  Patient       Patient will benefit from skilled therapeutic intervention in order to improve the following deficits and impairments:  Abnormal gait, Impaired sensation, Decreased activity tolerance, Decreased endurance, Decreased strength, Difficulty walking, Decreased balance  Visit Diagnosis: Difficulty in walking, not elsewhere classified  Balance problem  Muscle weakness (generalized)     Problem List There are no active problems to display for this patient.   Blima Ledgericole Ardean Simonich SPT 08/01/2017, 10:53 AM  [facility] [address] [place], [place], [address] Phone: [phone]   Fax:  [phone]  Name: Trudee Griplice Fawaz MRN: [id number] Date of Birth: [birth year]

## 2017-08-05 ENCOUNTER — Ambulatory Visit: Payer: Medicare Other | Admitting: Physical Therapy

## 2017-08-12 ENCOUNTER — Ambulatory Visit: Payer: Medicare Other | Admitting: Physical Therapy

## 2017-08-12 DIAGNOSIS — M6281 Muscle weakness (generalized): Secondary | ICD-10-CM

## 2017-08-12 DIAGNOSIS — R262 Difficulty in walking, not elsewhere classified: Secondary | ICD-10-CM | POA: Diagnosis not present

## 2017-08-12 DIAGNOSIS — R2689 Other abnormalities of gait and mobility: Secondary | ICD-10-CM

## 2017-08-12 NOTE — Therapy (Signed)
[place], [place], [address] Phone: [phone]   Fax:  [phone]  Physical Therapy Treatment  Patient Details  Name: Ana Johnston MRN: [id number] Date of Birth: Sep 19, 1945 Referring Provider: Tat   Encounter Date: 08/12/2017  PT End of Session - 08/12/17 1052    Visit Number  6    Date for PT Re-Evaluation  09/12/17    PT Start Time  1005    PT Stop Time  1052    PT Time Calculation (min)  47 min       Past Medical History:  Diagnosis Date  . Depression   . Hypertension   . Seasonal allergies     Past Surgical History:  Procedure Laterality Date  . ABDOMINAL HYSTERECTOMY    . BACK SURGERY  09/2016  . CATARACT EXTRACTION, BILATERAL    . CHOLECYSTECTOMY      There were no vitals filed for this visit.  Subjective Assessment - 08/12/17 1016    Subjective  no falls/stumbles    Currently in Pain?  No/denies                      [facility] Adult PT Treatment/Exercise - 08/12/17 0001      High Level Balance   High Level Balance Activities  Other (comment) SLS ball toss    High Level Balance Comments  standing on Bosu ball toss posterior LOB, working on correction with min A      Lumbar Exercises: Aerobic   Nustep  L 5 6 min      Knee/Hip Exercises: Standing   Forward Step Up  Both;1 set;10 reps;Hand Hold: 2 BOSU    Walking with Sports Cord  30# fwd/back x5 sidestepping x3 added 4 inch step up at end Mod A needed with SW    Other Standing Knee Exercises  50# cable press down 15 each leg STS on airex 3 sets 5    Other Standing Knee Exercises  10 # hip 4 way with cables 10 reps very weak and LE shaking               PT Short Term Goals - 07/22/17 1355      PT SHORT TERM GOAL #1   Title  independent with initial HEP    Status  Achieved        PT Long Term Goals - 08/12/17 1052      PT LONG TERM GOAL #1   Title  improve BERG >52 for safety with gait  and functional gait    Status  On-going      PT LONG TERM GOAL #2   Title  increase overal LE strength 4+/5 for functional gait    Baseline  4/5    Status  Partially Met      PT LONG TERM GOAL #3   Title  decrease TUG time to 13 seconds    Baseline  15 sec    Status  Partially Met      PT LONG TERM GOAL #4   Title  report no difficulty or limitation with ADLs    Status  Partially Met            Plan - 08/12/17 1053    Clinical Impression Statement  STG met and progressing with LTGS. pt still with hip weakness  esp noted with standing hip cable pulls and LOB posterior with balance activities- working on righting  reaction. Pt continues to work out at gym.    PT Treatment/Interventions  Gait training;Stair training;Therapeutic activities;Therapeutic exercise;Balance training;Patient/family education;Neuromuscular re-education    PT Next Visit Plan  TRY TM. hip strength and balance       Patient will benefit from skilled therapeutic intervention in order to improve the following deficits and impairments:  Abnormal gait, Impaired sensation, Decreased activity tolerance, Decreased endurance, Decreased strength, Difficulty walking, Decreased balance  Visit Diagnosis: Difficulty in walking, not elsewhere classified  Balance problem  Muscle weakness (generalized)     Problem List There are no active problems to display for this patient.   Avenell Sellers,ANGIE PTA 08/12/2017, 10:55 AM  [place], [place], [address] Phone: [phone]   Fax:  [phone]  Name: Ana Johnston MRN: [id number] Date of Birth: [birth year]

## 2017-08-15 ENCOUNTER — Ambulatory Visit: Payer: Medicare Other | Admitting: Physical Therapy

## 2017-08-15 DIAGNOSIS — M6281 Muscle weakness (generalized): Secondary | ICD-10-CM

## 2017-08-15 DIAGNOSIS — R262 Difficulty in walking, not elsewhere classified: Secondary | ICD-10-CM

## 2017-08-15 DIAGNOSIS — R2689 Other abnormalities of gait and mobility: Secondary | ICD-10-CM

## 2017-08-15 NOTE — Therapy (Signed)
[place], [place], [address] Phone: [phone]   Fax:  [phone]  Physical Therapy Treatment  Patient Details  Name: Ana Johnston MRN: [id number] Date of Birth: [birth year] Referring Provider: Tat   Encounter Date: 08/15/2017  PT End of Session - 08/15/17 1008    Visit Number  7    Date for PT Re-Evaluation  09/12/17    PT Start Time  0925    PT Stop Time  1015    PT Time Calculation (min)  50 min       Past Medical History:  Diagnosis Date  . Depression   . Hypertension   . Seasonal allergies     Past Surgical History:  Procedure Laterality Date  . ABDOMINAL HYSTERECTOMY    . BACK SURGERY  09/2016  . CATARACT EXTRACTION, BILATERAL    . CHOLECYSTECTOMY      There were no vitals filed for this visit.  Subjective Assessment - 08/15/17 0926    Subjective  you are right I was very sore in my hips ( esp on Rt ) from ex    Currently in Pain?  Yes    Pain Score  2     Pain Location  Hip                      [facility] Adult PT Treatment/Exercise - 08/15/17 0001      Ambulation/Gait   Gait Comments  amb around the bldg incline/decline/transition surfaces no LOB with good cadeance      Lumbar Exercises: Aerobic   Tread Mill  1.7 mph 8 min    Nustep  L 5 7 min      Lumbar Exercises: Machines for Strengthening   Cybex Knee Extension  10# 3x10    Cybex Knee Flexion  20# 3 x10    Leg Press  30# 3 sets 10      Knee/Hip Exercises: Seated   Sit to Sand  10 reps;without UE support on airex               PT Short Term Goals - 07/22/17 1355      PT SHORT TERM GOAL #1   Title  independent with initial HEP    Status  Achieved        PT Long Term Goals - 08/15/17 1007      PT LONG TERM GOAL #1   Title  improve BERG >52 for safety with gait and functional gait    Status  On-going      PT LONG TERM GOAL #2   Title  increase overal LE strength 4+/5 for  functional gait    Baseline  4/5    Status  Partially Met      PT LONG TERM GOAL #3   Title  decrease TUG time to 13 seconds    Status  Achieved      PT LONG TERM GOAL #4   Title  report no difficulty or limitation with ADLs    Status  Partially Met            Plan - 08/15/17 1009    Clinical Impression Statement  did TM well and okayed to do at gym. good gait around the bldg without issue. focus of stength and endurance today. TUG goal met.    PT Treatment/Interventions  Gait training;Stair training;Therapeutic activities;Therapeutic exercise;Balance training;Patient/family education;Neuromuscular re-education    PT Next Visit  Plan  balance       Patient will benefit from skilled therapeutic intervention in order to improve the following deficits and impairments:  Abnormal gait, Impaired sensation, Decreased activity tolerance, Decreased endurance, Decreased strength, Difficulty walking, Decreased balance  Visit Diagnosis: Difficulty in walking, not elsewhere classified  Balance problem  Muscle weakness (generalized)     Problem List There are no active problems to display for this patient.   Makaiah Terwilliger,ANGIE  PTA 08/15/2017, 10:11 AM  [place], [place], [address] Phone: [phone]   Fax:  [phone]  Name: Ana Johnston MRN: [id number] Date of Birth: 07/25/1945

## 2017-08-22 ENCOUNTER — Ambulatory Visit: Payer: Medicare Other | Admitting: Physical Therapy

## 2017-08-22 ENCOUNTER — Encounter: Payer: Self-pay | Admitting: Physical Therapy

## 2017-08-22 DIAGNOSIS — R262 Difficulty in walking, not elsewhere classified: Secondary | ICD-10-CM | POA: Diagnosis not present

## 2017-08-22 DIAGNOSIS — M6281 Muscle weakness (generalized): Secondary | ICD-10-CM

## 2017-08-22 DIAGNOSIS — R2689 Other abnormalities of gait and mobility: Secondary | ICD-10-CM

## 2017-08-22 NOTE — Therapy (Signed)
[place], [place], [address] Phone: [phone]   Fax:  [phone]  Physical Therapy Treatment  Patient Details  Name: Ana Johnston MRN: [id number] Date of Birth: June 17, 1945 Referring Provider: Tat   Encounter Date: 08/22/2017  PT End of Session - 08/22/17 1059    Visit Number  8    Date for PT Re-Evaluation  09/12/17    PT Start Time  2297    PT Stop Time  1055    PT Time Calculation (min)  40 min    Activity Tolerance  Patient tolerated treatment well    Behavior During Therapy  [facility] for tasks assessed/performed       Past Medical History:  Diagnosis Date   Depression    Hypertension    Seasonal allergies     Past Surgical History:  Procedure Laterality Date   ABDOMINAL HYSTERECTOMY     BACK SURGERY  09/2016   CATARACT EXTRACTION, BILATERAL     CHOLECYSTECTOMY      There were no vitals filed for this visit.  Subjective Assessment - 08/22/17 1102    Subjective  Tripped over her [employer] a couple of days ago causing her to fall to the floor.  She reports bruising her face, Right elbow, and knee on her Left side, but did not incur any injury beyond  bruising.  Per her daughter,Raihana did not have any righting reactions or attempt to stop herself from falling                No data recorded       [facility] Adult PT Treatment/Exercise - 08/22/17 0001      High Level Balance   High Level Balance Activities  Side stepping green band      Lumbar Exercises: Aerobic   Tread Mill  1.7 mph 8 min    Nustep  L 5 7 min      Lumbar Exercises: Machines for Strengthening   Cybex Knee Extension  10# 3x10    Cybex Knee Flexion  20# 3 x10    Leg Press  30# 3 sets 10               PT Short Term Goals - 07/22/17 1355      PT SHORT TERM GOAL #1   Title  independent with initial HEP    Status  Achieved        PT Long Term Goals - 08/15/17 1007      PT LONG  TERM GOAL #1   Title  improve BERG >52 for safety with gait and functional gait    Status  On-going      PT LONG TERM GOAL #2   Title  increase overal LE strength 4+/5 for functional gait    Baseline  4/5    Status  Partially Met      PT LONG TERM GOAL #3   Title  decrease TUG time to 13 seconds    Status  Achieved      PT LONG TERM GOAL #4   Title  report no difficulty or limitation with ADLs    Status  Partially Met            Plan - 08/22/17 1059    Clinical Impression Statement  Most challenged with Right OKC movement with 4 way hip strengthening.  Movement of the Right LE during this is much more ataxic vs Left  Patient will benefit from skilled therapeutic intervention in order to improve the following deficits and impairments:     Visit Diagnosis: Difficulty in walking, not elsewhere classified  Balance problem  Muscle weakness (generalized)     Problem List There are no active problems to display for this patient.   Olean Ree, PTA 08/22/2017, 12:00 PM  [place], [place], [address] Phone: [phone]   Fax:  [phone]  Name: Ana Johnston MRN: [id number] Date of Birth: July 26, 1945

## 2017-08-26 ENCOUNTER — Ambulatory Visit: Payer: Medicare Other | Admitting: Physical Therapy

## 2017-08-29 ENCOUNTER — Ambulatory Visit: Payer: Medicare Other | Admitting: Physical Therapy

## 2017-09-02 ENCOUNTER — Ambulatory Visit: Payer: Medicare Other | Admitting: Physical Therapy

## 2017-09-04 ENCOUNTER — Ambulatory Visit: Payer: Medicare Other | Admitting: Physical Therapy

## 2017-09-05 ENCOUNTER — Encounter: Payer: Medicare Other | Admitting: Physical Therapy

## 2017-09-16 ENCOUNTER — Ambulatory Visit: Payer: Medicare Other | Attending: Neurology | Admitting: Physical Therapy

## 2017-09-16 DIAGNOSIS — R262 Difficulty in walking, not elsewhere classified: Secondary | ICD-10-CM | POA: Insufficient documentation

## 2017-09-16 DIAGNOSIS — R2689 Other abnormalities of gait and mobility: Secondary | ICD-10-CM

## 2017-09-16 DIAGNOSIS — M6281 Muscle weakness (generalized): Secondary | ICD-10-CM

## 2017-09-16 NOTE — Therapy (Signed)
[place], [place], [address] Phone: [phone]   Fax:  [phone]  Physical Therapy Treatment  Patient Details  Name: Ana Johnston MRN: [id number] Date of Birth: 21-Jul-1945 Referring Provider: Tat   Encounter Date: 09/16/2017  PT End of Session - 09/16/17 1206    Visit Number  9    Date for PT Re-Evaluation  09/12/17    PT Start Time  1130    PT Stop Time  1215    PT Time Calculation (min)  45 min       Past Medical History:  Diagnosis Date  . Depression   . Hypertension   . Seasonal allergies     Past Surgical History:  Procedure Laterality Date  . ABDOMINAL HYSTERECTOMY    . BACK SURGERY  09/2016  . CATARACT EXTRACTION, BILATERAL    . CHOLECYSTECTOMY      There were no vitals filed for this visit.  Subjective Assessment - 09/16/17 1135    Subjective  upper resp. infection past 3 weeks - its been awful    Currently in Pain?  No/denies                       [facility] Adult PT Treatment/Exercise - 09/16/17 0001      Lumbar Exercises: Aerobic   Recumbent Bike  5 min    Nustep  L 4 6 min      Lumbar Exercises: Machines for Strengthening   Cybex Knee Extension  10# 3x10    Cybex Knee Flexion  20# 3 x10    Leg Press  30# 3 sets 10      Lumbar Exercises: Seated   Sit to Stand  5 reps 3 sets wt ball on airex min A with posterior lean      Knee/Hip Exercises: Standing   Walking with Sports Cord  30# fwd/back x5 sidestepping x3               PT Short Term Goals - 07/22/17 1355      PT SHORT TERM GOAL #1   Title  independent with initial HEP    Status  Achieved        PT Long Term Goals - 08/15/17 1007      PT LONG TERM GOAL #1   Title  improve BERG >52 for safety with gait and functional gait    Status  On-going      PT LONG TERM GOAL #2   Title  increase overal LE strength 4+/5 for functional gait    Baseline  4/5    Status  Partially Met       PT LONG TERM GOAL #3   Title  decrease TUG time to 13 seconds    Status  Achieved      PT LONG TERM GOAL #4   Title  report no difficulty or limitation with ADLs    Status  Partially Met            Plan - 09/16/17 1217    Clinical Impression Statement  pt has been out sick for 3 weeks. tolerated session with multi seated recoveries d/t SOB and weakness. pt verb no falls since last session    PT Treatment/Interventions  Gait training;Stair training;Therapeutic activities;Therapeutic exercise;Balance training;Patient/family education;Neuromuscular re-education    PT Next Visit Plan  strength/endurance/balance       Patient will benefit from skilled therapeutic intervention in order  to improve the following deficits and impairments:  Abnormal gait, Impaired sensation, Decreased activity tolerance, Decreased endurance, Decreased strength, Difficulty walking, Decreased balance  Visit Diagnosis: Balance problem  Difficulty in walking, not elsewhere classified  Muscle weakness (generalized)     Problem List There are no active problems to display for this patient.   Ragan Duhon,ANGIE PTA 09/16/2017, 12:18 PM  [place] [address] [place], [place], [address] Phone: [phone]   Fax:  [phone]  Name: Laberta Wilbon MRN: [id number] Date of Birth: 1945/07/23

## 2017-09-25 ENCOUNTER — Ambulatory Visit: Payer: Medicare Other | Admitting: Physical Therapy

## 2017-09-25 ENCOUNTER — Encounter: Payer: Self-pay | Admitting: Physical Therapy

## 2017-09-25 DIAGNOSIS — R2689 Other abnormalities of gait and mobility: Secondary | ICD-10-CM

## 2017-09-25 DIAGNOSIS — M6281 Muscle weakness (generalized): Secondary | ICD-10-CM

## 2017-09-25 DIAGNOSIS — R262 Difficulty in walking, not elsewhere classified: Secondary | ICD-10-CM

## 2017-09-25 NOTE — Therapy (Signed)
During this treatment session, the therapist was present, participating in and directing the treatment. [place], [place], [address] Phone: [phone]   Fax:  [phone]  Physical Therapy Treatment This covers the period from visit 1-visit 10  Patient Details  Name: Ana Johnston MRN: [id number] Date of Birth: 04-07-1946 Referring Provider: Tat   Encounter Date: 09/25/2017  PT End of Session - 09/25/17 1010    Visit Number  10    Date for PT Re-Evaluation  10/15/17    PT Start Time  1005    PT Stop Time  1050    PT Time Calculation (min)  45 min    Activity Tolerance  Patient tolerated treatment well    Behavior During Therapy  [facility] for tasks assessed/performed       Past Medical History:  Diagnosis Date  . Depression   . Hypertension   . Seasonal allergies     Past Surgical History:  Procedure Laterality Date  . ABDOMINAL HYSTERECTOMY    . BACK SURGERY  09/2016  . CATARACT EXTRACTION, BILATERAL    . CHOLECYSTECTOMY      There were no vitals filed for this visit.  Subjective Assessment - 09/25/17 1008    Subjective  "Doing better, no falls. I think the knee ext bothered my back"    Pain Score  0-No pain                       [facility] Adult PT Treatment/Exercise - 09/25/17 0001      Standardized Balance Assessment   Standardized Balance Assessment  Berg Balance Test      Berg Balance Test   Sit to Stand  Able to stand without using hands and stabilize independently    Standing Unsupported  Able to stand safely 2 minutes    Sitting with Back Unsupported but Feet Supported on Floor or Stool  Able to sit safely and securely 2 minutes    Stand to Sit  Sits safely with minimal use of hands    Transfers  Able to transfer safely, minor use of hands    Standing Unsupported with Eyes Closed  Able to stand 10 seconds safely    Standing Ubsupported with Feet Together   Able to place feet together independently and stand 1 minute safely    From Standing, Reach Forward with Outstretched Arm  Can reach confidently >25 cm (10")    From Standing Position, Pick up Object from Floor  Able to pick up shoe, needs supervision    From Standing Position, Turn to Look Behind Over each Shoulder  Looks behind from both sides and weight shifts well    Turn 360 Degrees  Able to turn 360 degrees safely in 4 seconds or less    Standing Unsupported, Alternately Place Feet on Step/Stool  Able to stand independently and complete 8 steps >20 seconds    Standing Unsupported, One Foot in Front  Able to take small step independently and hold 30 seconds    Standing on One Leg  Able to lift leg independently and hold equal to or more than 3 seconds    Total Score  50      High Level Balance   High Level Balance Activities  Other (comment);Tandem walking    High Level Balance Comments  step ups with bosu ball, step up on bosu, Tandem on airex, Single leg stance  Lumbar Exercises: Aerobic   Nustep  L5 6 min      Lumbar Exercises: Machines for Strengthening   Cybex Knee Extension  10# 3x10    Cybex Knee Flexion  20lb 2x10, 15lb 2x10    Leg Press  30# 3 sets 10      Knee/Hip Exercises: Standing   Other Standing Knee Exercises  1x5 chair squats, 1x10 functional squats               PT Short Term Goals - 07/22/17 1355      PT SHORT TERM GOAL #1   Title  independent with initial HEP    Status  Achieved        PT Long Term Goals - 09/25/17 1014      PT LONG TERM GOAL #1   Title  improve BERG >52 for safety with gait and functional gait    Baseline  50    Status  Partially Met      PT LONG TERM GOAL #2   Title  increase overal LE strength 4+/5 for functional gait    Baseline  4/5    Status  Partially Met      PT LONG TERM GOAL #3   Title  decrease TUG time to 13 seconds    Status  Achieved      PT LONG TERM GOAL #4   Title  report no difficulty or  limitation with ADLs    Status  Partially Met            Plan - 09/25/17 1054    Clinical Impression Statement  Pt verbally reported that knee extension machine may have bothered her back, but she tried the machine again today and reported no problems. Focused session on high level balance activities, pt needed assistance to help maintain balance. Pt fatigued easily during session and needed numerous rest breaks. Pt progressing towards goals.    Rehab Potential  Good    PT Frequency  2x / week    PT Duration  8 weeks    PT Treatment/Interventions  Gait training;Stair training;Therapeutic activities;Therapeutic exercise;Balance training;Patient/family education;Neuromuscular re-education    PT Next Visit Plan  focus strength and endurance       Patient will benefit from skilled therapeutic intervention in order to improve the following deficits and impairments:  Abnormal gait, Impaired sensation, Decreased activity tolerance, Decreased endurance, Decreased strength, Difficulty walking, Decreased balance  Visit Diagnosis: Balance problem  Difficulty in walking, not elsewhere classified  Muscle weakness (generalized)     Problem List There are no active problems to display for this patient.   Loyal Gambler 09/25/2017, 11:04 AM  [place] [address] [place], [place], [address] Phone: [phone]   Fax:  [phone]  Name: Ana Johnston MRN: [id number] Date of Birth: 26-Mar-1946

## 2017-09-30 ENCOUNTER — Ambulatory Visit: Payer: Medicare Other | Admitting: Physical Therapy

## 2017-09-30 ENCOUNTER — Encounter: Payer: Self-pay | Admitting: Physical Therapy

## 2017-09-30 DIAGNOSIS — R2689 Other abnormalities of gait and mobility: Secondary | ICD-10-CM | POA: Diagnosis not present

## 2017-09-30 DIAGNOSIS — R262 Difficulty in walking, not elsewhere classified: Secondary | ICD-10-CM

## 2017-09-30 DIAGNOSIS — M6281 Muscle weakness (generalized): Secondary | ICD-10-CM

## 2017-09-30 NOTE — Therapy (Signed)
[place], [place], [address] Phone: [phone]   Fax:  [phone]  Physical Therapy Treatment  Patient Details  Name: Ana Johnston MRN: [id number] Date of Birth: [birth year] Referring Provider: Tat   Encounter Date: 09/30/2017  PT End of Session - 09/30/17 1254    Visit Number  11    Date for PT Re-Evaluation  10/15/17    PT Start Time  8416    PT Stop Time  1232    PT Time Calculation (min)  47 min    Activity Tolerance  Patient tolerated treatment well    Behavior During Therapy  [facility] for tasks assessed/performed       Past Medical History:  Diagnosis Date  . Depression   . Hypertension   . Seasonal allergies     Past Surgical History:  Procedure Laterality Date  . ABDOMINAL HYSTERECTOMY    . BACK SURGERY  09/2016  . CATARACT EXTRACTION, BILATERAL    . CHOLECYSTECTOMY      There were no vitals filed for this visit.  Subjective Assessment - 09/30/17 1143    Subjective  "i'm doing better, but I have a crick in my neck"    Currently in Pain?  Yes    Pain Score  8     Pain Location  Neck    Pain Orientation  Right;Posterior                       [facility] Adult PT Treatment/Exercise - 09/30/17 0001      High Level Balance   High Level Balance Comments  step ups with bosu ball, lateral step up on bosu, Tandem on walk, Single leg stance      Self-Care   Self-Care  Other Self-Care Comments    Other Self-Care Comments   lateral cervical stretches      Lumbar Exercises: Aerobic   Nustep  L5 6 min      Knee/Hip Exercises: Machines for Strengthening   Cybex Knee Extension  5lb 2x10    Cybex Knee Flexion  20lbs 2x10      Knee/Hip Exercises: Standing   Wall Squat  2 sets;10 reps with blue P ball      Manual Therapy   Manual Therapy  Soft tissue mobilization    Manual therapy comments  to upper traps and right cervical extensors             PT Education  - 09/30/17 1258    Education Details  Neck ROM and cervical lateral flexion stretching    Person(s) Educated  Patient    Methods  Explanation;Demonstration    Comprehension  Returned demonstration       PT Short Term Goals - 07/22/17 1355      PT SHORT TERM GOAL #1   Title  independent with initial HEP    Status  Achieved        PT Long Term Goals - 09/25/17 1014      PT LONG TERM GOAL #1   Title  improve BERG >52 for safety with gait and functional gait    Baseline  50    Status  Partially Met      PT LONG TERM GOAL #2   Title  increase overal LE strength 4+/5 for functional gait    Baseline  4/5    Status  Partially Met      PT LONG TERM GOAL #  3   Title  decrease TUG time to 13 seconds    Status  Achieved      PT LONG TERM GOAL #4   Title  report no difficulty or limitation with ADLs    Status  Partially Met            Plan - 09/30/17 1255    Clinical Impression Statement  Pt verbally reported that her neck was hurting her. Focus     Rehab Potential  Good    PT Frequency  2x / week    PT Duration  8 weeks    PT Treatment/Interventions  Gait training;Stair training;Therapeutic activities;Therapeutic exercise;Balance training;Patient/family education;Neuromuscular re-education    PT Next Visit Plan  work on single leg balance activities and LE strengthening       Patient will benefit from skilled therapeutic intervention in order to improve the following deficits and impairments:  Abnormal gait, Impaired sensation, Decreased activity tolerance, Decreased endurance, Decreased strength, Difficulty walking, Decreased balance  Visit Diagnosis: Balance problem  Difficulty in walking, not elsewhere classified  Muscle weakness (generalized)     Problem List There are no active problems to display for this patient.   Loyal Gambler 09/30/2017, 1:07 PM  [place] [address]  [place], [place], [address] Phone: [phone]   Fax:  [phone]  Name: Ana Johnston MRN: [id number] Date of Birth: Dec 19, 1945

## 2017-10-03 ENCOUNTER — Ambulatory Visit: Payer: Medicare Other | Attending: Neurology | Admitting: Physical Therapy

## 2017-10-03 DIAGNOSIS — M542 Cervicalgia: Secondary | ICD-10-CM | POA: Insufficient documentation

## 2017-10-03 DIAGNOSIS — R2689 Other abnormalities of gait and mobility: Secondary | ICD-10-CM

## 2017-10-03 DIAGNOSIS — M6281 Muscle weakness (generalized): Secondary | ICD-10-CM

## 2017-10-03 DIAGNOSIS — R262 Difficulty in walking, not elsewhere classified: Secondary | ICD-10-CM

## 2017-10-03 NOTE — Therapy (Signed)
[place], [place], [address] Phone: [phone]   Fax:  [phone]  Physical Therapy Treatment  Patient Details  Name: Presli Fanguy MRN: [id number] Date of Birth: 04/29/1946 Referring Provider: Tat   Encounter Date: 10/03/2017  PT End of Session - 10/03/17 1100    Visit Number  12    Date for PT Re-Evaluation  10/15/17    PT Start Time  8786    PT Stop Time  1100    PT Time Calculation (min)  45 min    Activity Tolerance  Patient tolerated treatment well    Behavior During Therapy  [facility] for tasks assessed/performed       Past Medical History:  Diagnosis Date  . Depression   . Hypertension   . Seasonal allergies     Past Surgical History:  Procedure Laterality Date  . ABDOMINAL HYSTERECTOMY    . BACK SURGERY  09/2016  . CATARACT EXTRACTION, BILATERAL    . CHOLECYSTECTOMY      There were no vitals filed for this visit.  Subjective Assessment - 10/03/17 1020    Subjective  "my neck still hurts, and I have Low back pain"    Currently in Pain?  Yes    Pain Score  4     Pain Location  Back                       [facility] Adult PT Treatment/Exercise - 10/03/17 0001      High Level Balance   High Level Balance Activities  Tandem walking forward and backwards tandem walk      Lumbar Exercises: Machines for Strengthening   Cybex Knee Extension  15# 2x10    Cybex Knee Flexion  25lb 2x10    Leg Press  30# 1sets 15, 40#  2x15      Knee/Hip Exercises: Aerobic   Nustep  L5x43mn      Knee/Hip Exercises: Standing   SLS  3x each leg     Other Standing Knee Exercises  alt taps on bosu ball, step up on bosu, ball toss on airex    Other Standing Knee Exercises  tandem balance with HHA/CGA 4x                PT Short Term Goals - 07/22/17 1355      PT SHORT TERM GOAL #1   Title  independent with initial HEP    Status  Achieved        PT Long Term Goals -  09/25/17 1014      PT LONG TERM GOAL #1   Title  improve BERG >52 for safety with gait and functional gait    Baseline  50    Status  Partially Met      PT LONG TERM GOAL #2   Title  increase overal LE strength 4+/5 for functional gait    Baseline  4/5    Status  Partially Met      PT LONG TERM GOAL #3   Title  decrease TUG time to 13 seconds    Status  Achieved      PT LONG TERM GOAL #4   Title  report no difficulty or limitation with ADLs    Status  Partially Met            Plan - 10/03/17 1101    Clinical Impression Statement  Pt continues to improve  on her balance she has less LOB during tandem walking, however she still struggles with SLS. She has increased weight on leg press, knee extension and knee flexion on the machine. She reports doing her HEP at home.    PT Frequency  2x / week    PT Duration  8 weeks    PT Treatment/Interventions  Gait training;Stair training;Therapeutic activities;Therapeutic exercise;Balance training;Patient/family education;Neuromuscular re-education    PT Next Visit Plan  work on single leg balance activities and LE strengthening       Patient will benefit from skilled therapeutic intervention in order to improve the following deficits and impairments:  Abnormal gait, Impaired sensation, Decreased activity tolerance, Decreased endurance, Decreased strength, Difficulty walking, Decreased balance  Visit Diagnosis: Balance problem  Difficulty in walking, not elsewhere classified  Muscle weakness (generalized)     Problem List There are no active problems to display for this patient.   Loyal Gambler 10/03/2017, 11:04 AM  [place] [address] [place], [place], [address] Phone: [phone]   Fax:  [phone]  Name: Rosaelena Kemnitz MRN: [id number] Date of Birth: [birth year]

## 2017-10-07 ENCOUNTER — Ambulatory Visit: Payer: Medicare Other | Admitting: Physical Therapy

## 2017-10-07 DIAGNOSIS — R262 Difficulty in walking, not elsewhere classified: Secondary | ICD-10-CM

## 2017-10-07 DIAGNOSIS — R2689 Other abnormalities of gait and mobility: Secondary | ICD-10-CM | POA: Diagnosis not present

## 2017-10-07 DIAGNOSIS — M6281 Muscle weakness (generalized): Secondary | ICD-10-CM

## 2017-10-07 NOTE — Therapy (Signed)
[place], [place], [address] Phone: [phone]   Fax:  [phone]  Physical Therapy Treatment  Patient Details  Name: Ana Johnston MRN: [id number] Date of Birth: 07/05/1945 Referring Provider: Tat   Encounter Date: 10/07/2017  PT End of Session - 10/07/17 1215    Visit Number  13    Date for PT Re-Evaluation  10/15/17    PT Start Time  1142    PT Stop Time  1655    PT Time Calculation (min)  53 min       Past Medical History:  Diagnosis Date  . Depression   . Hypertension   . Seasonal allergies     Past Surgical History:  Procedure Laterality Date  . ABDOMINAL HYSTERECTOMY    . BACK SURGERY  09/2016  . CATARACT EXTRACTION, BILATERAL    . CHOLECYSTECTOMY      There were no vitals filed for this visit.  Subjective Assessment - 10/07/17 1146    Subjective  my neck is still really bothering me. no falls or LOB reported by pt    Currently in Pain?  Yes    Pain Score  5     Pain Location  Neck    Pain Orientation  Right                       [facility] Adult PT Treatment/Exercise - 10/07/17 0001      High Level Balance   High Level Balance Activities  Negotitating around obstacles      Lumbar Exercises: Aerobic   Nustep  L5 6 min      Knee/Hip Exercises: Machines for Strengthening   Cybex Knee Extension  5lb 2x10    Cybex Knee Flexion  20lbs 2x10    Cybex Leg Press  40# 3 sets 10      Knee/Hip Exercises: Standing   Walking with Sports Cord  40# fwd/backa nd sidestepping    Other Standing Knee Exercises  HHA 3# in airex LE ex for balance and strenth      Modalities   Modalities  Moist Heat;Electrical Stimulation      Moist Heat Therapy   Number Minutes Moist Heat  15 Minutes    Moist Heat Location  Cervical      Electrical Stimulation   Electrical Stimulation Location  cerv    Electrical Stimulation Action  IFC    Electrical Stimulation Parameters   supine    Electrical Stimulation Goals  Pain               PT Short Term Goals - 07/22/17 1355      PT SHORT TERM GOAL #1   Title  independent with initial HEP    Status  Achieved        PT Long Term Goals - 09/25/17 1014      PT LONG TERM GOAL #1   Title  improve BERG >52 for safety with gait and functional gait    Baseline  50    Status  Partially Met      PT LONG TERM GOAL #2   Title  increase overal LE strength 4+/5 for functional gait    Baseline  4/5    Status  Partially Met      PT LONG TERM GOAL #3   Title  decrease TUG time to 13 seconds    Status  Achieved  PT LONG TERM GOAL #4   Title  report no difficulty or limitation with ADLs    Status  Partially Met            Plan - 10/07/17 1215    Clinical Impression Statement  min A required d/t multi LOB with resisted gait and airex. pt attempted to tie shoes during session and had LOB 3 times requiring assistance to stab. pt still with complaints of cerv pain that appears muscular and limiting ROM to added MH and estin to today session as decreased cerv ROM compromises balance    PT Treatment/Interventions  Gait training;Stair training;Therapeutic activities;Therapeutic exercise;Balance training;Patient/family education;Neuromuscular re-education    PT Next Visit Plan  work on single leg balance activities and LE strengthening       Patient will benefit from skilled therapeutic intervention in order to improve the following deficits and impairments:  Abnormal gait, Impaired sensation, Decreased activity tolerance, Decreased endurance, Decreased strength, Difficulty walking, Decreased balance  Visit Diagnosis: Balance problem  Difficulty in walking, not elsewhere classified  Muscle weakness (generalized)     Problem List There are no active problems to display for this patient.   PAYSEUR,ANGIE  PTA 10/07/2017, 12:17 PM  [place] [address] [place], [place], [address] Phone: [phone]   Fax:  [phone]  Name: Ana Johnston MRN: [id number] Date of Birth: 1946/01/19

## 2017-10-10 ENCOUNTER — Ambulatory Visit: Payer: Medicare Other | Admitting: Physical Therapy

## 2017-10-10 DIAGNOSIS — M6281 Muscle weakness (generalized): Secondary | ICD-10-CM

## 2017-10-10 DIAGNOSIS — R262 Difficulty in walking, not elsewhere classified: Secondary | ICD-10-CM

## 2017-10-10 DIAGNOSIS — R2689 Other abnormalities of gait and mobility: Secondary | ICD-10-CM

## 2017-10-10 NOTE — Therapy (Signed)
[place], [place], [address] Phone: [phone]   Fax:  [phone]  Physical Therapy Treatment  Patient Details  Name: Ana Johnston MRN: [id number] Date of Birth: Aug 09, 1945 Referring Provider: Tat   Encounter Date: 10/10/2017  PT End of Session - 10/10/17 1134    Visit Number  14    Date for PT Re-Evaluation  10/15/17    PT Start Time  1050    PT Stop Time  1150    PT Time Calculation (min)  60 min       Past Medical History:  Diagnosis Date  . Depression   . Hypertension   . Seasonal allergies     Past Surgical History:  Procedure Laterality Date  . ABDOMINAL HYSTERECTOMY    . BACK SURGERY  09/2016  . CATARACT EXTRACTION, BILATERAL    . CHOLECYSTECTOMY      There were no vitals filed for this visit.  Subjective Assessment - 10/10/17 1054    Subjective  neck still bothering me but tx last time helped. verb slide out of bed last night and difficulty getting up    Currently in Pain?  Yes    Pain Score  6     Pain Location  Neck    Pain Orientation  Right                       [facility] Adult PT Treatment/Exercise - 10/10/17 0001      Lumbar Exercises: Aerobic   Nustep  L5 6 min      Lumbar Exercises: Machines for Strengthening   Cybex Knee Extension  15# 3x10    Cybex Knee Flexion  25lb 3x10    Leg Press  30# 2 sets 15      Lumbar Exercises: Standing   Other Standing Lumbar Exercises  stepping fwd and back and SW 10 each over foam roll      Lumbar Exercises: Seated   Sit to Stand  15 reps on airex. then 10 with ball toss      Knee/Hip Exercises: Standing   Other Standing Knee Exercises  on airex hip 3 way red tband 15 each      Modalities   Modalities  Moist Heat;Electrical Stimulation      Moist Heat Therapy   Number Minutes Moist Heat  15 Minutes    Moist Heat Location  Cervical      Electrical Stimulation   Electrical Stimulation Location  cerv     Electrical Stimulation Action  IFC    Electrical Stimulation Parameters  supine    Electrical Stimulation Goals  Pain               PT Short Term Goals - 07/22/17 1355      PT SHORT TERM GOAL #1   Title  independent with initial HEP    Status  Achieved        PT Long Term Goals - 10/10/17 1135      PT LONG TERM GOAL #1   Title  improve BERG >52 for safety with gait and functional gait    Baseline  50    Status  Partially Met      PT LONG TERM GOAL #2   Title  increase overal LE strength 4+/5 for functional gait    Baseline  pt does fatigue quickly    Status  Achieved      PT  LONG TERM GOAL #3   Title  decrease TUG time to 13 seconds    Status  Achieved      PT LONG TERM GOAL #4   Title  report no difficulty or limitation with ADLs    Status  Partially Met            Plan - 10/10/17 1136    Clinical Impression Statement  progressing with goals MMT test goal met but does fatigue and get shakey after ther ex. BERG remains the same at 50 and requires assistance with LOB with high level balance ex.    PT Treatment/Interventions  Gait training;Stair training;Therapeutic activities;Therapeutic exercise;Balance training;Patient/family education;Neuromuscular re-education    PT Next Visit Plan  work on single leg balance activities and LE strengthening. FLOOR TRANSFER       Patient will benefit from skilled therapeutic intervention in order to improve the following deficits and impairments:  Abnormal gait, Impaired sensation, Decreased activity tolerance, Decreased endurance, Decreased strength, Difficulty walking, Decreased balance  Visit Diagnosis: Balance problem  Difficulty in walking, not elsewhere classified  Muscle weakness (generalized)     Problem List There are no active problems to display for this patient.   Nerida Boivin,ANGIE PTA 10/10/2017, 11:37 AM  [place] [facility]  [place], [place], [address] Phone: [phone]   Fax:  [phone]  Name: Ana Johnston MRN: [id number] Date of Birth: [birth year]

## 2017-10-15 ENCOUNTER — Ambulatory Visit: Payer: Medicare Other | Admitting: Physical Therapy

## 2017-10-15 ENCOUNTER — Encounter: Payer: Self-pay | Admitting: Physical Therapy

## 2017-10-15 DIAGNOSIS — R262 Difficulty in walking, not elsewhere classified: Secondary | ICD-10-CM

## 2017-10-15 DIAGNOSIS — M6281 Muscle weakness (generalized): Secondary | ICD-10-CM

## 2017-10-15 DIAGNOSIS — R2689 Other abnormalities of gait and mobility: Secondary | ICD-10-CM | POA: Diagnosis not present

## 2017-10-15 NOTE — Therapy (Signed)
[place], [place], [address] Phone: [phone]   Fax:  [phone]  Physical Therapy Treatment  Patient Details  Name: Ana Johnston MRN: [id number] Date of Birth: [birth year] Referring Provider: Tat   Encounter Date: 10/15/2017  PT End of Session - 10/15/17 1008    Visit Number  15    Date for PT Re-Evaluation  11/15/17    PT Start Time  0923    PT Stop Time  1020    PT Time Calculation (min)  57 min    Activity Tolerance  Patient limited by pain    Behavior During Therapy  [facility] for tasks assessed/performed       Past Medical History:  Diagnosis Date  . Depression   . Hypertension   . Seasonal allergies     Past Surgical History:  Procedure Laterality Date  . ABDOMINAL HYSTERECTOMY    . BACK SURGERY  09/2016  . CATARACT EXTRACTION, BILATERAL    . CHOLECYSTECTOMY      There were no vitals filed for this visit.  Subjective Assessment - 10/15/17 0925    Subjective  Patient continues to c/o neck pain, reports that she has had a "crick" in her neck for a couple of months, difficulty turning tot he right    Currently in Pain?  Yes    Pain Score  8     Pain Orientation  Right    Pain Descriptors / Indicators  Sore;Spasm;Aching    Pain Type  Acute pain    Aggravating Factors   turning head to the right    Pain Relieving Factors  the heat feels good                       [facility] Adult PT Treatment/Exercise - 10/15/17 0001      Ambulation/Gait   Gait Comments  gait around the building, close supervision due to loss of balance      Therapeutic Activites    Therapeutic Activities  Other Therapeutic Activities    Other Therapeutic Activities  worked on getting up from the floor, kneeling to standing using the Bosu to knee on, then on mat table worked on going from long sitting to quadraped, needed a lot of help with this and cue to problem solve      Moist Heat Therapy   Number Minutes Moist Heat  15 Minutes    Moist Heat Location  Cervical      Electrical Stimulation   Electrical Stimulation Location  cervical     Electrical Stimulation Action  IFC    Electrical Stimulation Parameters  supine    Electrical Stimulation Goals  Pain      Manual Therapy   Manual Therapy  Soft tissue mobilization;Manual Traction    Soft tissue mobilization  gentle right cervical and upper trap area, some passive shoudler depression    Manual Traction  gentle occipital release and some passive ROM to the end ranges             PT Education - 10/15/17 1008    Education provided  Yes    Education Details  reinforced the cervical ROM and asked her to take to end ranges with deep breathing    Person(s) Educated  Patient    Methods  Explanation;Demonstration;Verbal cues    Comprehension  Returned demonstration;Verbalized understanding;Verbal cues required       PT Short Term Goals - 07/22/17  [place] #1   Title  independent with initial HEP    Status  Achieved        PT Long Term Goals - 10/15/17 1011      PT LONG TERM GOAL #1   Title  improve BERG >52 for safety with gait and functional gait    Status  Partially Met      PT LONG TERM GOAL #2   Title  increase overal LE strength 4+/5 for functional gait    Status  Achieved      PT LONG TERM GOAL #3   Title  decrease TUG time to 13 seconds    Status  Achieved      PT LONG TERM GOAL #4   Title  report no difficulty or limitation with ADLs    Status  Partially Met            Plan - 10/15/17 1009    Clinical Impression Statement  Patient has significant spasms in the right cervical and upper trap area, she is very tender, she is unable to relax during manual therapy, she is very gaurded.  She has a lot of difficulty getting up from floor so we tried this she needed a lot of cues and some assist to break this down into steps    PT Next Visit Plan  We will continue in the next  period to work on floor transfers and her neck pain    Consulted and Agree with Plan of Care  Patient       Patient will benefit from skilled therapeutic intervention in order to improve the following deficits and impairments:  Abnormal gait, Impaired sensation, Decreased activity tolerance, Decreased endurance, Decreased strength, Difficulty walking, Decreased balance  Visit Diagnosis: Balance problem - Plan: PT plan of care cert/re-cert  Difficulty in walking, not elsewhere classified - Plan: PT plan of care cert/re-cert  Muscle weakness (generalized) - Plan: PT plan of care cert/re-cert     Problem List There are no active problems to display for this patient.   Sumner Boast., PT 10/15/2017, 10:15 AM  [place], [place], [address] Phone: [phone]   Fax:  [phone]  Name: Ana Johnston MRN: [id number] Date of Birth: June 22, 1945

## 2017-10-17 ENCOUNTER — Encounter: Payer: Self-pay | Admitting: Physical Therapy

## 2017-10-17 ENCOUNTER — Ambulatory Visit: Payer: Medicare Other | Admitting: Physical Therapy

## 2017-10-17 DIAGNOSIS — R262 Difficulty in walking, not elsewhere classified: Secondary | ICD-10-CM

## 2017-10-17 DIAGNOSIS — R2689 Other abnormalities of gait and mobility: Secondary | ICD-10-CM | POA: Diagnosis not present

## 2017-10-17 DIAGNOSIS — M542 Cervicalgia: Secondary | ICD-10-CM

## 2017-10-17 DIAGNOSIS — M6281 Muscle weakness (generalized): Secondary | ICD-10-CM

## 2017-10-17 NOTE — Therapy (Signed)
[place], [place], [address] Phone: [phone]   Fax:  [phone]  Physical Therapy Treatment  Patient Details  Name: Ana Johnston MRN: [id number] Date of Birth: Mar 30, 1946 Referring Provider: Tat   Encounter Date: 10/17/2017  PT End of Session - 10/17/17 1007    Visit Number  16    Date for PT Re-Evaluation  11/15/17    PT Start Time  0925    PT Stop Time  1022    PT Time Calculation (min)  57 min    Activity Tolerance  Patient limited by pain    Behavior During Therapy  [facility] for tasks assessed/performed       Past Medical History:  Diagnosis Date  . Depression   . Hypertension   . Seasonal allergies     Past Surgical History:  Procedure Laterality Date  . ABDOMINAL HYSTERECTOMY    . BACK SURGERY  09/2016  . CATARACT EXTRACTION, BILATERAL    . CHOLECYSTECTOMY      There were no vitals filed for this visit.  Subjective Assessment - 10/17/17 0939    Subjective  Patient reports that she saw MD and got a shot, the neck pain is some better but she reports still hurts and has a knot in it    Currently in Pain?  Yes    Pain Score  5     Pain Location  Neck    Pain Orientation  Right    Aggravating Factors   head motions    Pain Relieving Factors  the shot felt good                       [facility] Adult PT Treatment/Exercise - 10/17/17 0001      High Level Balance   High Level Balance Activities  Side stepping;Backward walking;Tandem walking    High Level Balance Comments  heel walk, toe walk, on airex balance activities, head turns, raching, ball toss, eyes closed, narrow BOS      Lumbar Exercises: Aerobic   Nustep  L5 6 min      Lumbar Exercises: Machines for Strengthening   Cybex Knee Extension  15# 3x10    Cybex Knee Flexion  25lb 3x10    Leg Press  30# 2 sets 15      Lumbar Exercises: Standing   Other Standing Lumbar Exercises  stepping fwd and back and SW  10 each over foam roll      Moist Heat Therapy   Number Minutes Moist Heat  15 Minutes    Moist Heat Location  Cervical      Electrical Stimulation   Electrical Stimulation Location  cervical     Electrical Stimulation Action  IFC    Electrical Stimulation Parameters  supine    Electrical Stimulation Goals  Pain               PT Short Term Goals - 07/22/17 1355      PT SHORT TERM GOAL #1   Title  independent with initial HEP    Status  Achieved        PT Long Term Goals - 10/15/17 1011      PT LONG TERM GOAL #1   Title  improve BERG >52 for safety with gait and functional gait    Status  Partially Met      PT LONG TERM GOAL #2   Title  increase overal  LE strength 4+/5 for functional gait    Status  Achieved      PT LONG TERM GOAL #3   Title  decrease TUG time to 13 seconds    Status  Achieved      PT LONG TERM GOAL #4   Title  report no difficulty or limitation with ADLs    Status  Partially Met            Plan - 10/17/17 1007    Clinical Impression Statement  Patient is feeling better today pain down from an 8/10 to a 5/10.  She has some better head motions, but has difficult time relaxing.  I feel that she is fairly gaurded due to the balance issues and this may exacerbate the neck issue and the mm spasms    PT Next Visit Plan  We will continue in the next period to work on floor transfers and her neck pain    Consulted and Agree with Plan of Care  Patient       Patient will benefit from skilled therapeutic intervention in order to improve the following deficits and impairments:  Abnormal gait, Impaired sensation, Decreased activity tolerance, Decreased endurance, Decreased strength, Difficulty walking, Decreased balance  Visit Diagnosis: Balance problem  Difficulty in walking, not elsewhere classified  Muscle weakness (generalized)  Cervicalgia     Problem List There are no active problems to display for this  patient.   Sumner Boast., PT 10/17/2017, 10:10 AM  [place], [place], [address] Phone: [phone]   Fax:  [phone]  Name: Isaac Lacson MRN: [id number] Date of Birth: [birth year]

## 2017-10-22 ENCOUNTER — Encounter: Payer: Self-pay | Admitting: Physical Therapy

## 2017-10-22 ENCOUNTER — Ambulatory Visit: Payer: Medicare Other | Admitting: Physical Therapy

## 2017-10-22 DIAGNOSIS — R2689 Other abnormalities of gait and mobility: Secondary | ICD-10-CM

## 2017-10-22 DIAGNOSIS — R262 Difficulty in walking, not elsewhere classified: Secondary | ICD-10-CM

## 2017-10-22 DIAGNOSIS — M6281 Muscle weakness (generalized): Secondary | ICD-10-CM

## 2017-10-22 NOTE — Therapy (Signed)
[place], [place], [address] Phone: [phone]   Fax:  [phone]  Physical Therapy Treatment  Patient Details  Name: Ana Johnston MRN: [id number] Date of Birth: 1945/07/11 Referring Provider: Tat   Encounter Date: 10/22/2017  PT End of Session - 10/22/17 1150    Visit Number  17    Date for PT Re-Evaluation  11/15/17    PT Start Time  1104    PT Stop Time  1150    PT Time Calculation (min)  46 min    Activity Tolerance  Patient tolerated treatment well    Behavior During Therapy  [facility] for tasks assessed/performed       Past Medical History:  Diagnosis Date  . Depression   . Hypertension   . Seasonal allergies     Past Surgical History:  Procedure Laterality Date  . ABDOMINAL HYSTERECTOMY    . BACK SURGERY  09/2016  . CATARACT EXTRACTION, BILATERAL    . CHOLECYSTECTOMY      There were no vitals filed for this visit.  Subjective Assessment - 10/22/17 1108    Subjective  Patient reports that she is still having neck pain but it is getting better, denies any falls    Currently in Pain?  Yes    Pain Score  4     Pain Location  Neck    Aggravating Factors   stres                       [facility] Adult PT Treatment/Exercise - 10/22/17 0001      Transfers   Comments  worked with patine on getting up from the floor, she needed help to problem solve and needs min A with something solid to help her pull up, without the solid furniture to pull up she cannot do without moderate A, we tried again at the end of the treatment when her legs were tired and she needed mod/max A      Ambulation/Gait   Gait Comments  gait around the building, brisk pace, close supervision, tends to shuffle when she goes up the hill and is fatigued      Lumbar Exercises: Aerobic   Nustep  L5 6 min      Lumbar Exercises: Machines for Strengthening   Cybex Knee Extension  15# 3x10    Cybex Knee  Flexion  25lb 3x10    Leg Press  30# 2 sets 15, then single legs 20#       Lumbar Exercises: Standing   Other Standing Lumbar Exercises  stepping fwd and back and SW 10 each over foam roll      Knee/Hip Exercises: Standing   Gait Training  resisted gait all directions, very fatigued with the side stepping               PT Short Term Goals - 07/22/17 1355      PT SHORT TERM GOAL #1   Title  independent with initial HEP    Status  Achieved        PT Long Term Goals - 10/22/17 1152      PT LONG TERM GOAL #1   Title  improve BERG >52 for safety with gait and functional gait    Status  Partially Met      PT LONG TERM GOAL #5   Title  get up from floor without help    Time  5    Period  Weeks    Status  On-going            Plan - 10/22/17 1150    Clinical Impression Statement  Patient continues to c/o neck pain, she reports that she was going to try a massage later today.  We focused treatment today on strength of the LE's and working on getting up from the floor.  This is very hard for her, I suggested using furniture, she reports that she has trouble getting to the furniture, the left leg seems to be weaker    PT Next Visit Plan  continue to work on floor transfers and leg strength    Consulted and Agree with Plan of Care  Patient       Patient will benefit from skilled therapeutic intervention in order to improve the following deficits and impairments:  Abnormal gait, Impaired sensation, Decreased activity tolerance, Decreased endurance, Decreased strength, Difficulty walking, Decreased balance  Visit Diagnosis: Balance problem  Difficulty in walking, not elsewhere classified  Muscle weakness (generalized)     Problem List There are no active problems to display for this patient.   Sumner Boast., PT 10/22/2017, 11:53 AM  [place], [place], [address] Phone:  [phone]   Fax:  [phone]  Name: Ana Johnston MRN: [id number] Date of Birth: 06/18/1945

## 2017-10-24 ENCOUNTER — Ambulatory Visit: Payer: Medicare Other | Admitting: Physical Therapy

## 2017-10-24 DIAGNOSIS — R2689 Other abnormalities of gait and mobility: Secondary | ICD-10-CM

## 2017-10-24 DIAGNOSIS — R262 Difficulty in walking, not elsewhere classified: Secondary | ICD-10-CM

## 2017-10-24 DIAGNOSIS — M6281 Muscle weakness (generalized): Secondary | ICD-10-CM

## 2017-10-24 NOTE — Therapy (Signed)
[place], [place], [address] Phone: [phone]   Fax:  [phone]  Physical Therapy Treatment  Patient Details  Name: Ana Johnston MRN: [id number] Date of Birth: 15-May-1946 Referring Provider: Tat   Encounter Date: 10/24/2017  PT End of Session - 10/24/17 1132    Visit Number  18    Date for PT Re-Evaluation  11/15/17    PT Start Time  1055    PT Stop Time  1145    PT Time Calculation (min)  50 min       Past Medical History:  Diagnosis Date  . Depression   . Hypertension   . Seasonal allergies     Past Surgical History:  Procedure Laterality Date  . ABDOMINAL HYSTERECTOMY    . BACK SURGERY  09/2016  . CATARACT EXTRACTION, BILATERAL    . CHOLECYSTECTOMY      There were no vitals filed for this visit.  Subjective Assessment - 10/24/17 1048    Subjective  I got a massage and it helped my neck. frustrated I can not get off floor- its like my brain and legs are not connected    Currently in Pain?  Yes    Pain Score  2     Pain Location  Neck    Pain Descriptors / Indicators  Sore                       [facility] Adult PT Treatment/Exercise - 10/24/17 0001      Berg Balance Test   Sit to Stand  Able to stand without using hands and stabilize independently    Standing Unsupported  Able to stand safely 2 minutes    Sitting with Back Unsupported but Feet Supported on Floor or Stool  Able to sit safely and securely 2 minutes    Stand to Sit  Sits safely with minimal use of hands    Transfers  Able to transfer safely, minor use of hands    Standing Unsupported with Eyes Closed  Able to stand 10 seconds safely    Standing Ubsupported with Feet Together  Able to place feet together independently and stand 1 minute safely    From Standing, Reach Forward with Outstretched Arm  Can reach confidently >25 cm (10")    From Standing Position, Pick up Object from Floor  Able to  pick up shoe, needs supervision    From Standing Position, Turn to Look Behind Over each Shoulder  Looks behind from both sides and weight shifts well    Turn 360 Degrees  Able to turn 360 degrees safely in 4 seconds or less    Standing Unsupported, Alternately Place Feet on Step/Stool  Able to stand independently and complete 8 steps >20 seconds    Standing Unsupported, One Foot in Front  Able to take small step independently and hold 30 seconds    Standing on One Leg  Able to lift leg independently and hold equal to or more than 3 seconds    Total Score  50      Therapeutic Activites    Other Therapeutic Activities  crawl across mat to box to stand 5 times, min cuing to sequence and min A to stand      Lumbar Exercises: Aerobic   Elliptical  42fd/3back I 5 R 4 very fatigued and needed min A to get off and SOB      Lumbar  Exercises: Machines for Strengthening   Cybex Knee Extension  15# 3x10    Cybex Knee Flexion  25lb 3x10    Leg Press  30# 2 sets 15, then single legs 20#       Lumbar Exercises: Seated   Sit to Stand  15 reps from box      Knee/Hip Exercises: Standing   Forward Step Up  Both;2 sets;5 sets;Hand Hold: 0;Step Height: 6" CG- min A d/t LOB ,cuing for full knee ext    SLS with Vectors  on/off airex very difficulty and multi LOB    Gait Training  resisted gait in hall with tband fatigued after and not picking up feet wit gait    Other Standing Knee Exercises  kneel on BOSU and push up from mat for floor transfer strength- 3 sets 5               PT Short Term Goals - 07/22/17 1355      PT SHORT TERM GOAL #1   Title  independent with initial HEP    Status  Achieved        PT Long Term Goals - 10/24/17 1131      PT LONG TERM GOAL #1   Title  improve BERG >52 for safety with gait and functional gait    Baseline  50    Status  Partially Met      PT LONG TERM GOAL #2   Title  increase overal LE strength 4+/5 for functional gait    Baseline  pt does  fatigue quickly    Status  Achieved      PT LONG TERM GOAL #3   Title  decrease TUG time to 13 seconds    Status  Achieved      PT LONG TERM GOAL #4   Title  report no difficulty or limitation with ADLs    Status  Partially Met      PT LONG TERM GOAL #5   Title  get up from floor without help    Status  On-going            Plan - 10/24/17 1133    Clinical Impression Statement  pt BERG remains the same at 50 /56. pt still with difficulty with floor transfers and high level balance activities. pt with good tested MMT but fatigues quickly and then balance decreases.    PT Treatment/Interventions  Gait training;Stair training;Therapeutic activities;Therapeutic exercise;Balance training;Patient/family education;Neuromuscular re-education    PT Next Visit Plan  continue to work on floor transfers and leg strength       Patient will benefit from skilled therapeutic intervention in order to improve the following deficits and impairments:  Abnormal gait, Impaired sensation, Decreased activity tolerance, Decreased endurance, Decreased strength, Difficulty walking, Decreased balance  Visit Diagnosis: Balance problem  Difficulty in walking, not elsewhere classified  Muscle weakness (generalized)     Problem List There are no active problems to display for this patient.   Ana Johnston,ANGIE PTA 10/24/2017, 11:35 AM  [place], [place], [address] Phone: [phone]   Fax:  [phone]  Name: Ana Johnston MRN: [id number] Date of Birth: 1945/10/14

## 2017-10-31 ENCOUNTER — Encounter: Payer: Self-pay | Admitting: Physical Therapy

## 2017-10-31 ENCOUNTER — Ambulatory Visit: Payer: Medicare Other | Admitting: Physical Therapy

## 2017-10-31 DIAGNOSIS — M6281 Muscle weakness (generalized): Secondary | ICD-10-CM

## 2017-10-31 DIAGNOSIS — R2689 Other abnormalities of gait and mobility: Secondary | ICD-10-CM | POA: Diagnosis not present

## 2017-10-31 DIAGNOSIS — R262 Difficulty in walking, not elsewhere classified: Secondary | ICD-10-CM

## 2017-10-31 NOTE — Therapy (Signed)
[place], [place], [address] Phone: [phone]   Fax:  [phone]  Physical Therapy Treatment  Patient Details  Name: Ana Johnston MRN: [id number] Date of Birth: [birth year] Referring Provider: Tat   Encounter Date: 10/31/2017  PT End of Session - 10/31/17 1045    Visit Number  19    Date for PT Re-Evaluation  11/15/17    PT Start Time  1694    PT Stop Time  1100    PT Time Calculation (min)  45 min       Past Medical History:  Diagnosis Date  . Depression   . Hypertension   . Seasonal allergies     Past Surgical History:  Procedure Laterality Date  . ABDOMINAL HYSTERECTOMY    . BACK SURGERY  09/2016  . CATARACT EXTRACTION, BILATERAL    . CHOLECYSTECTOMY      There were no vitals filed for this visit.  Subjective Assessment - 10/31/17 1018    Subjective  very sore after last session even hard to get off toilet. did well walking at beach. neck still sore    Currently in Pain?  Yes    Pain Score  3     Pain Location  Neck                       [facility] Adult PT Treatment/Exercise - 10/31/17 0001      High Level Balance   High Level Balance Comments  ball toss and kick with mvmt and airex      Lumbar Exercises: Aerobic   Nustep  L 6 42mn      Lumbar Exercises: Machines for Strengthening   Cybex Knee Extension  15# 3x10    Cybex Knee Flexion  25lb 3x10    Leg Press  30# 2 sets 15      Knee/Hip Exercises: Standing   Walking with Sports Cord  40# fwd/back and sidestepping only 1 LOB    Other Standing Knee Exercises  fitter SW BIL LE fitter hip flex and ext               PT Short Term Goals - 07/22/17 1355      PT SHORT TERM GOAL #1   Title  independent with initial HEP    Status  Achieved        PT Long Term Goals - 10/31/17 1045      PT LONG TERM GOAL #1   Title  improve BERG >52 for safety with gait and functional gait    Status   Partially Met      PT LONG TERM GOAL #4   Title  report no difficulty or limitation with ADLs    Status  Partially Met      PT LONG TERM GOAL #5   Title  get up from floor without help            Plan - 10/31/17 1046    Clinical Impression Statement  increased balance and righting noted with ball toss and kick. tolerated wts well but does fatigue.     PT Treatment/Interventions  Gait training;Stair training;Therapeutic activities;Therapeutic exercise;Balance training;Patient/family education;Neuromuscular re-education    PT Next Visit Plan  continue to work on floor transfers and leg strength       Patient will benefit from skilled therapeutic intervention in order to improve the following deficits and impairments:  Abnormal gait, Impaired  sensation, Decreased activity tolerance, Decreased endurance, Decreased strength, Difficulty walking, Decreased balance  Visit Diagnosis: Balance problem  Difficulty in walking, not elsewhere classified  Muscle weakness (generalized)     Problem List There are no active problems to display for this patient.   PAYSEUR,ANGIE PTA 10/31/2017, 10:55 AM  [place], [place], [address] Phone: [phone]   Fax:  [phone]  Name: Ana Johnston MRN: [id number] Date of Birth: 01-31-1946

## 2017-11-06 ENCOUNTER — Ambulatory Visit: Payer: Medicare Other | Attending: Neurology | Admitting: Physical Therapy

## 2017-11-06 ENCOUNTER — Encounter: Payer: Self-pay | Admitting: Physical Therapy

## 2017-11-06 DIAGNOSIS — R2689 Other abnormalities of gait and mobility: Secondary | ICD-10-CM | POA: Insufficient documentation

## 2017-11-06 DIAGNOSIS — R262 Difficulty in walking, not elsewhere classified: Secondary | ICD-10-CM | POA: Diagnosis present

## 2017-11-06 DIAGNOSIS — M6281 Muscle weakness (generalized): Secondary | ICD-10-CM | POA: Insufficient documentation

## 2017-11-06 DIAGNOSIS — M542 Cervicalgia: Secondary | ICD-10-CM | POA: Diagnosis present

## 2017-11-06 NOTE — Therapy (Signed)
[facility] [address] [place], [place], [address] Phone: [phone]   Fax:  [phone]  Physical Therapy Treatment  Patient Details  Name: Ana Johnston MRN: [id number] Date of Birth: 03/19/1946 Referring Provider: Tat   Encounter Date: 11/06/2017  PT End of Session - 11/06/17 1208    Visit Number  20    Date for PT Re-Evaluation  11/15/17    PT Start Time  1125    PT Stop Time  1210    PT Time Calculation (min)  45 min       Past Medical History:  Diagnosis Date  . Depression   . Hypertension   . Seasonal allergies     Past Surgical History:  Procedure Laterality Date  . ABDOMINAL HYSTERECTOMY    . BACK SURGERY  09/2016  . CATARACT EXTRACTION, BILATERAL    . CHOLECYSTECTOMY      There were no vitals filed for this visit.  Subjective Assessment - 11/06/17 1124    Subjective  I feel stronger, denies falls    Currently in Pain?  No/denies                       [facility] Adult PT Treatment/Exercise - 11/06/17 0001      Therapeutic Activites    Other Therapeutic Activities  crawl across mat to box to stand 5 times- mod I did again after ther ex - mod I 1 x without cuing      Lumbar Exercises: Aerobic   Nustep  L 6 6min      Lumbar Exercises: Machines for Strengthening   Cybex Knee Extension  15# 3x10    Cybex Knee Flexion  25lb 3x10    Leg Press  30# 2 sets 15 calf raises 30# 2 sets 15      Lumbar Exercises: Standing   Other Standing Lumbar Exercises  stepping up over and back on 6 inch 10 each CGA for balance and needs 1 hand support on rail      Lumbar Exercises: Seated   Sit to Stand  20 reps wt ball      Knee/Hip Exercises: Standing   Functional Squat  2 sets;10 reps HHA on sit fit    Walking with Sports Cord  30# fwd/back and sidestepping added 6 inch step up    Other Standing Knee Exercises  pulley leg press down 15 each 40#               PT Short Term Goals -  07/22/17 1355      PT SHORT TERM GOAL #1   Title  independent with initial HEP    Status  Achieved        PT Long Term Goals - 11/06/17 1153      PT LONG TERM GOAL #1   Title  improve BERG >52 for safety with gait and functional gait    Status  Partially Met      PT LONG TERM GOAL #2   Title  increase overal LE strength 4+/5 for functional gait    Status  Achieved      PT LONG TERM GOAL #3   Title  decrease TUG time to 13 seconds    Status  Achieved      PT LONG TERM GOAL #4   Title  report no difficulty or limitation with ADLs    Status  Partially Met        PT LONG TERM GOAL #5   Title  get up from floor without help    Status  Partially Met            Plan - 11/06/17 1209    Clinical Impression Statement  pt able to get up from floor today without cuing or assitance. difficulty with step overs and sqaust without assistance d/t decreased balance and weakness. progressing with goals    PT Treatment/Interventions  Gait training;Stair training;Therapeutic activities;Therapeutic exercise;Balance training;Patient/family education;Neuromuscular re-education    PT Next Visit Plan  gait/balance/LE strength       Patient will benefit from skilled therapeutic intervention in order to improve the following deficits and impairments:  Abnormal gait, Impaired sensation, Decreased activity tolerance, Decreased endurance, Decreased strength, Difficulty walking, Decreased balance  Visit Diagnosis: Balance problem  Difficulty in walking, not elsewhere classified  Muscle weakness (generalized)     Problem List There are no active problems to display for this patient.   PAYSEUR,ANGIE PTA 11/06/2017, 12:10 PM  [facility] [address] [place], [place], [address] Phone: [phone]   Fax:  [phone]  Name: Vitoria Detlefsen MRN: [id number] Date of Birth: 11/17/1945   

## 2017-11-12 ENCOUNTER — Encounter: Payer: Self-pay | Admitting: Physical Therapy

## 2017-11-12 ENCOUNTER — Ambulatory Visit: Payer: Medicare Other | Admitting: Physical Therapy

## 2017-11-12 DIAGNOSIS — M6281 Muscle weakness (generalized): Secondary | ICD-10-CM

## 2017-11-12 DIAGNOSIS — R262 Difficulty in walking, not elsewhere classified: Secondary | ICD-10-CM

## 2017-11-12 DIAGNOSIS — R2689 Other abnormalities of gait and mobility: Secondary | ICD-10-CM | POA: Diagnosis not present

## 2017-11-12 DIAGNOSIS — M542 Cervicalgia: Secondary | ICD-10-CM

## 2017-11-12 NOTE — Therapy (Signed)
[place], [place], [address] Phone: [phone]   Fax:  [phone]  Physical Therapy Treatment  Patient Details  Name: Ana Johnston MRN: [id number] Date of Birth: 05-17-1946 Referring Provider: Tat   Encounter Date: 11/12/2017  PT End of Session - 11/12/17 1223    Visit Number  21    Date for PT Re-Evaluation  11/15/17    PT Start Time  1143    PT Stop Time  1226    PT Time Calculation (min)  43 min    Activity Tolerance  Patient tolerated treatment well    Behavior During Therapy  [facility] for tasks assessed/performed       Past Medical History:  Diagnosis Date  . Depression   . Hypertension   . Seasonal allergies     Past Surgical History:  Procedure Laterality Date  . ABDOMINAL HYSTERECTOMY    . BACK SURGERY  09/2016  . CATARACT EXTRACTION, BILATERAL    . CHOLECYSTECTOMY      There were no vitals filed for this visit.  Subjective Assessment - 11/12/17 1144    Subjective  Pt reports going to granddaughters graduation last Friday having to walk a lot of stairs. Pt reports that her back has been hurting since.    Currently in Pain?  Yes    Pain Score  5     Pain Location  Back                       [facility] Adult PT Treatment/Exercise - 11/12/17 0001      Ambulation/Gait   Gait Comments  one flight of stairs, alt pattern one rail. Back down ambulated around building up hill. side step, backwards walking up and down hill       High Level Balance   High Level Balance Activities  Side stepping;Negotiating around obstacles;Negotiating over obstacles    High Level Balance Comments  side stepping, backwards walking, up and down  hill., Side stepping over foam roll on airex; [facility] pick up standing on foam roll. Marching on  airex.      Lumbar Exercises: Aerobic   Recumbent Bike  L1 x6 min       Lumbar Exercises: Machines for Strengthening   Cybex Knee Extension  15# 3x10     Cybex Knee Flexion  25lb 3x10    Leg Press  30# 2 sets 15; SL 20lb x10 each               PT Short Term Goals - 07/22/17 1355      PT SHORT TERM GOAL #1   Title  independent with initial HEP    Status  Achieved        PT Long Term Goals - 11/06/17 1153      PT LONG TERM GOAL #1   Title  improve BERG >52 for safety with gait and functional gait    Status  Partially Met      PT LONG TERM GOAL #2   Title  increase overal LE strength 4+/5 for functional gait    Status  Achieved      PT LONG TERM GOAL #3   Title  decrease TUG time to 13 seconds    Status  Achieved      PT LONG TERM GOAL #4   Title  report no difficulty or limitation with ADLs    Status  Partially Met  PT LONG TERM GOAL #5   Title  get up from floor without help    Status  Partially Met            Plan - 11/12/17 1223    Clinical Impression Statement  Pt did well with stair negotiation and outdoor slope interventions. Some instability weaving around foam rolls. Some difficulty stepping over foam rolls. Go strength on machine level exercises.     Rehab Potential  Good    PT Frequency  2x / week    PT Duration  8 weeks    PT Treatment/Interventions  Gait training;Stair training;Therapeutic activities;Therapeutic exercise;Balance training;Patient/family education;Neuromuscular re-education    PT Next Visit Plan  gait/balance/LE strength       Patient will benefit from skilled therapeutic intervention in order to improve the following deficits and impairments:  Abnormal gait, Impaired sensation, Decreased activity tolerance, Decreased endurance, Decreased strength, Difficulty walking, Decreased balance  Visit Diagnosis: Difficulty in walking, not elsewhere classified  Balance problem  Muscle weakness (generalized)  Cervicalgia     Problem List There are no active problems to display for this patient.   Scot Jun, PTA 11/12/2017, 12:31 PM  [place] [address] [place], [place], [address] Phone: [phone]   Fax:  [phone]  Name: Ana Johnston MRN: [id number] Date of Birth: Apr 20, 1946

## 2017-11-18 ENCOUNTER — Ambulatory Visit: Payer: Medicare Other | Admitting: Physical Therapy

## 2017-11-18 ENCOUNTER — Encounter: Payer: Self-pay | Admitting: Physical Therapy

## 2017-11-18 DIAGNOSIS — M6281 Muscle weakness (generalized): Secondary | ICD-10-CM

## 2017-11-18 DIAGNOSIS — R262 Difficulty in walking, not elsewhere classified: Secondary | ICD-10-CM

## 2017-11-18 DIAGNOSIS — R2689 Other abnormalities of gait and mobility: Secondary | ICD-10-CM | POA: Diagnosis not present

## 2017-11-18 NOTE — Therapy (Signed)
[place], [place], [address] Phone: [phone]   Fax:  [phone]  Physical Therapy Treatment  Patient Details  Name: Ana Johnston MRN: [id number] Date of Birth: [birth year] Referring Provider: Tat   Encounter Date: 11/18/2017  PT End of Session - 11/18/17 1232    Visit Number  22    Date for PT Re-Evaluation  11/15/17    PT Start Time  1205    PT Stop Time  1245    PT Time Calculation (min)  40 min       Past Medical History:  Diagnosis Date  . Depression   . Hypertension   . Seasonal allergies     Past Surgical History:  Procedure Laterality Date  . ABDOMINAL HYSTERECTOMY    . BACK SURGERY  09/2016  . CATARACT EXTRACTION, BILATERAL    . CHOLECYSTECTOMY      There were no vitals filed for this visit.  Subjective Assessment - 11/18/17 1210    Subjective  nothing new    Currently in Pain?  No/denies                       [facility] Adult PT Treatment/Exercise - 11/18/17 0001      Berg Balance Test   Sit to Stand  Able to stand without using hands and stabilize independently    Standing Unsupported  Able to stand safely 2 minutes    Sitting with Back Unsupported but Feet Supported on Floor or Stool  Able to sit safely and securely 2 minutes    Stand to Sit  Sits safely with minimal use of hands    Transfers  Able to transfer safely, minor use of hands    Standing Unsupported with Eyes Closed  Able to stand 10 seconds safely    Standing Ubsupported with Feet Together  Able to place feet together independently and stand 1 minute safely    From Standing, Reach Forward with Outstretched Arm  Can reach confidently >25 cm (10")    From Standing Position, Pick up Object from Floor  Able to pick up shoe safely and easily    From Standing Position, Turn to Look Behind Over each Shoulder  Looks behind from both sides and weight shifts well    Turn 360 Degrees  Able to turn  360 degrees safely in 4 seconds or less    Standing Unsupported, Alternately Place Feet on Step/Stool  Able to stand independently and complete 8 steps >20 seconds    Standing Unsupported, One Foot in Front  Able to plae foot ahead of the other independently and hold 30 seconds    Standing on One Leg  Able to lift leg independently and hold 5-10 seconds    Total Score  53      Lumbar Exercises: Aerobic   Nustep  L 6 60mn      Lumbar Exercises: Machines for Strengthening   Leg Press  30# 2 sets 15; SL 20lb x10 each      Knee/Hip Exercises: Machines for Strengthening   Cybex Knee Extension  10# 3 sets 10    Cybex Knee Flexion  20# 3 sets 10      Knee/Hip Exercises: Standing   Other Standing Knee Exercises  airex and foam roll stepping fwd/back and SW for balance 10 each with HHA needed             PT Education -  11/18/17 1231    Education Details  return to gym safety    Person(s) Educated  Patient    Methods  Explanation    Comprehension  Verbalized understanding       PT Short Term Goals - 07/22/17 1355      PT SHORT TERM GOAL #1   Title  independent with initial HEP    Status  Achieved        PT Long Term Goals - 11/18/17 1223      PT LONG TERM GOAL #1   Title  improve BERG >52 for safety with gait and functional gait    Baseline  53    Status  Achieved      PT LONG TERM GOAL #2   Title  increase overal LE strength 4+/5 for functional gait    Status  Achieved      PT LONG TERM GOAL #3   Title  decrease TUG time to 13 seconds    Status  Achieved      PT LONG TERM GOAL #4   Title  report no difficulty or limitation with ADLs    Status  Partially Met      PT LONG TERM GOAL #5   Title  get up from floor without help    Baseline  able to get dog toy form under sofa and get up    Status  Achieved            Plan - 11/18/17 1232    Clinical Impression Statement  all goals met. pt has not been nback to gym since being sick and plans to return after  July 4th vacation- discussed return to gym saftey. Pt BERG is 53/56 so out of fall risk but at times pt can be impulsive so discussed sloing down and taking time with mvmt esp in unfamiliar settings- pt VU    PT Treatment/Interventions  Gait training;Stair training;Therapeutic activities;Therapeutic exercise;Balance training;Patient/family education;Neuromuscular re-education    PT Next Visit Plan  D/C       Patient will benefit from skilled therapeutic intervention in order to improve the following deficits and impairments:  Abnormal gait, Impaired sensation, Decreased activity tolerance, Decreased endurance, Decreased strength, Difficulty walking, Decreased balance  Visit Diagnosis: Difficulty in walking, not elsewhere classified  Balance problem  Muscle weakness (generalized)     Problem List There are no active problems to display for this patient.  PHYSICAL THERAPY DISCHARGE SUMMARY   Plan: Patient agrees to discharge.  Patient goals were met. Patient is being discharged due to meeting the stated rehab goals.  ?????      PAYSEUR,ANGIE PTA 11/18/2017, 12:36 PM  [place] [address] [place], [place], [address] Phone: [phone]   Fax:  [phone]  Name: Ana Johnston MRN: [id number] Date of Birth: 24-Sep-1945

## 2017-11-21 ENCOUNTER — Ambulatory Visit: Payer: Medicare Other | Admitting: Physical Therapy

## 2017-12-03 ENCOUNTER — Encounter: Payer: Self-pay | Admitting: Physical Therapy

## 2019-10-31 ENCOUNTER — Other Ambulatory Visit: Payer: Self-pay

## 2019-10-31 ENCOUNTER — Emergency Department (HOSPITAL_BASED_OUTPATIENT_CLINIC_OR_DEPARTMENT_OTHER)
Admission: EM | Admit: 2019-10-31 | Discharge: 2019-11-01 | Disposition: A | Discharge status: Home / Self Care | Payer: Medicare HMO | Attending: Emergency Medicine | Admitting: Emergency Medicine

## 2019-10-31 ENCOUNTER — Emergency Department (HOSPITAL_BASED_OUTPATIENT_CLINIC_OR_DEPARTMENT_OTHER): Payer: Medicare HMO

## 2019-10-31 ENCOUNTER — Encounter (HOSPITAL_BASED_OUTPATIENT_CLINIC_OR_DEPARTMENT_OTHER): Payer: Self-pay | Admitting: Emergency Medicine

## 2019-10-31 DIAGNOSIS — I10 Essential (primary) hypertension: Secondary | ICD-10-CM | POA: Insufficient documentation

## 2019-10-31 DIAGNOSIS — Y9389 Activity, other specified: Secondary | ICD-10-CM | POA: Insufficient documentation

## 2019-10-31 DIAGNOSIS — R55 Syncope and collapse: Secondary | ICD-10-CM

## 2019-10-31 DIAGNOSIS — S0101XA Laceration without foreign body of scalp, initial encounter: Secondary | ICD-10-CM | POA: Diagnosis not present

## 2019-10-31 DIAGNOSIS — I951 Orthostatic hypotension: Secondary | ICD-10-CM | POA: Insufficient documentation

## 2019-10-31 DIAGNOSIS — Y92002 Bathroom of unspecified non-institutional (private) residence single-family (private) house as the place of occurrence of the external cause: Secondary | ICD-10-CM | POA: Diagnosis not present

## 2019-10-31 DIAGNOSIS — Z79899 Other long term (current) drug therapy: Secondary | ICD-10-CM | POA: Diagnosis not present

## 2019-10-31 DIAGNOSIS — W19XXXA Unspecified fall, initial encounter: Secondary | ICD-10-CM

## 2019-10-31 DIAGNOSIS — Y999 Unspecified external cause status: Secondary | ICD-10-CM | POA: Insufficient documentation

## 2019-10-31 DIAGNOSIS — F17210 Nicotine dependence, cigarettes, uncomplicated: Secondary | ICD-10-CM | POA: Diagnosis not present

## 2019-10-31 DIAGNOSIS — W1839XA Other fall on same level, initial encounter: Secondary | ICD-10-CM | POA: Insufficient documentation

## 2019-10-31 MED ORDER — SODIUM CHLORIDE 0.9 % IV BOLUS
1000.0000 mL | Freq: Once | INTRAVENOUS | Status: AC
  Administered 2019-11-01: 1000 mL via INTRAVENOUS

## 2019-10-31 NOTE — ED Provider Notes (Signed)
[facility] EMERGENCY DEPARTMENT Provider Note   CSN: [id number] Arrival date & time: 10/31/19  2253     History Chief Complaint  Patient presents with  . Head Injury    Ana Johnston is a 74 y.o. female.  HPI     This is a 74 year old female with a history of hypertension and depression who presents following a fall.  Patient reports that she frequently gets lightheaded upon standing.  She stood up after falling asleep to go to the bathroom and passed out in the bathroom.  She states that she felt dizzy prior to this episode.  She hit her head.  She denies pain anywhere else except for her head.  She noted a laceration there.  Rates her pain 8 out of 10.  She denies neck pain.  No nausea or vomiting.  No chest pain, shortness of breath, abdominal pain.  Denies recent illnesses.  Past Medical History:  Diagnosis Date  . Depression   . Hypertension   . Seasonal allergies     There are no problems to display for this patient.   Past Surgical History:  Procedure Laterality Date  . ABDOMINAL HYSTERECTOMY    . BACK SURGERY  09/2016  . CATARACT EXTRACTION, BILATERAL    . CHOLECYSTECTOMY       OB History   No obstetric history on file.     Family History  Problem Relation Age of Onset  . Diabetes Mother   . Heart attack Father   . Breast cancer Sister     Social History   Tobacco Use  . Smoking status: Current Every Day Smoker  . Smokeless tobacco: Never Used  . Tobacco comment: 1/2 ppd x 50+ years  Substance Use Topics  . Alcohol use: Yes    Comment: 2 glasses of wine a day  . Drug use: No    Home Medications Prior to Admission medications   Medication Sig Start Date End Date Taking? Authorizing Provider  amLODipine (NORVASC) 10 MG tablet Take 10 mg by mouth daily.    [provider]  buPROPion (WELLBUTRIN SR) 150 MG 12 hr tablet Take 150 mg by mouth 2 (two) times daily. 05/19/17   [provider]  calcium-vitamin D (OSCAL WITH  D) 500-200 MG-UNIT tablet Take 1 tablet by mouth.    [provider]  Cholecalciferol (VITAMIN D3) 1000 units CHEW Chew by mouth.    [provider]  clonazePAM (KLONOPIN) 0.5 MG tablet Take 0.5 mg by mouth at bedtime. 06/06/17   [provider]  Melatonin 10 MG TABS Take 30 mg by mouth.    [provider]  montelukast (SINGULAIR) 10 MG tablet Take 10 mg by mouth daily. 06/06/17   [provider]  Omega-3 Fatty Acids (FISH OIL) 1200 MG CAPS Take by mouth.    [provider]  TRAZODONE HCL PO Take 50 mg by mouth at bedtime.     [provider]  venlafaxine (EFFEXOR) 75 MG tablet Take 75 mg by mouth daily.    [provider]  vitamin E 1000 UNIT capsule Take by mouth daily.    [provider]    Allergies    Patient has no known allergies.  Review of Systems   Review of Systems  Constitutional: Negative for fever.  Respiratory: Negative for shortness of breath.   Cardiovascular: Negative for chest pain.  Gastrointestinal: Negative for abdominal pain, nausea and vomiting.  Genitourinary: Negative for dysuria.  Skin: Positive for  wound.  Neurological: Positive for dizziness, syncope and headaches.  All other systems reviewed and are negative.   Physical Exam Updated Vital Signs BP (!) 137/93   Pulse 83   Temp 98.1 F (36.7 C) (Oral)   Resp 20   Ht 1.549 m (5\' 1" )   Wt 71.7 kg   SpO2 100%   BMI 29.85 kg/m   Physical Exam Vitals and nursing note reviewed.  Constitutional:      Appearance: She is well-developed.     Comments: Overweight, ABCs intact, non-ill-appearing  HENT:     Head: Normocephalic.     Comments: 2 cm laceration over the left posterior scalp    Nose: Nose normal.     Mouth/Throat:     Mouth: Mucous membranes are moist.  Eyes:     Extraocular Movements: Extraocular movements intact.     Pupils: Pupils are equal, round, and reactive to light.  Neck:     Comments: No midline  C-spine tenderness to palpation, step-off, deformity Cardiovascular:     Rate and Rhythm: Normal rate and regular rhythm.     Heart sounds: Normal heart sounds.  Pulmonary:     Effort: Pulmonary effort is normal. No respiratory distress.     Breath sounds: No wheezing.  Abdominal:     Palpations: Abdomen is soft.     Tenderness: There is no abdominal tenderness.  Musculoskeletal:        General: No tenderness, deformity or signs of injury.     Cervical back: Neck supple.     Right lower leg: No edema.     Left lower leg: No edema.  Skin:    General: Skin is warm and dry.  Neurological:     Mental Status: She is alert and oriented to person, place, and time.  Psychiatric:        Mood and Affect: Mood normal.     ED Results / Procedures / Treatments   Labs (all labs ordered are listed, but only abnormal results are displayed) Labs Reviewed  CBC WITH DIFFERENTIAL/PLATELET - Abnormal; Notable for the following components:      Result Value   Hemoglobin 15.7 (*)    All other components within normal limits  BASIC METABOLIC PANEL - Abnormal; Notable for the following components:   Potassium 3.1 (*)    CO2 21 (*)    Glucose, Bld 111 (*)    Calcium 8.7 (*)    All other components within normal limits  URINALYSIS, ROUTINE W REFLEX MICROSCOPIC - Abnormal; Notable for the following components:   Specific Gravity, Urine <1.005 (*)    Hgb urine dipstick TRACE (*)    All other components within normal limits  URINALYSIS, MICROSCOPIC (REFLEX) - Abnormal; Notable for the following components:   Bacteria, UA RARE (*)    All other components within normal limits    EKG EKG Interpretation  Date/Time:  Sunday Oct 31 2019 23:52:09 EDT Ventricular Rate:  75 PR Interval:    QRS Duration: 99 QT Interval:  434 QTC Calculation: 485 R Axis:   -32 Text Interpretation: Normal sinus rhythm Abnormal R-wave progression, early transition LVH with secondary repolarization abnormality Confirmed  by [id number] (Ross Marcus) on 11/01/2019 12:37:08 AM   Radiology CT Head Wo Contrast  Result Date: 11/01/2019 CLINICAL DATA:  2 cm laceration to the posterior head, syncope EXAM: CT HEAD WITHOUT CONTRAST TECHNIQUE: Contiguous axial images were obtained from the base of the skull through the vertex without intravenous contrast. COMPARISON:  None. FINDINGS: Brain: No evidence of acute territorial infarction, hemorrhage, hydrocephalus,extra-axial collection or mass lesion/mass effect. There is dilatation the ventricles and sulci consistent with age-related atrophy. Low-attenuation changes in the deep white matter consistent with small vessel ischemia. Vascular: No hyperdense vessel or unexpected calcification. Skull: The skull is intact. No fracture or focal lesion identified. Sinuses/Orbits: The visualized paranasal sinuses and mastoid air cells are clear. The orbits and globes intact. Other: Mild soft tissue swelling with a small hematoma seen overlying the left parietal skull. IMPRESSION: No acute intracranial abnormality. Findings consistent with age related atrophy and chronic small vessel ischemia Electronically Signed   By: Prudencio Pair M.D.   On: 11/01/2019 00:39    Procedures .Marland KitchenLaceration Repair  Date/Time: 11/01/2019 1:39 AM Performed by: Merryl Hacker, MD Authorized by: Merryl Hacker, MD   Consent:    Consent obtained:  Verbal   Consent given by:  Patient   Risks discussed:  Infection and pain Anesthesia (see MAR for exact dosages):    Anesthesia method:  None Laceration details:    Location:  Scalp   Scalp location:  Crown   Length (cm):  2   Depth (mm):  3 Repair type:    Repair type:  Simple Pre-procedure details:    Preparation:  Patient was prepped and draped in usual sterile fashion Exploration:    Hemostasis achieved with:  Direct pressure   Wound extent: no nerve damage noted, no tendon damage noted, no underlying fracture noted and no vascular damage noted       Contaminated: no   Treatment:    Area cleansed with:  Betadine   Amount of cleaning:  Standard   Irrigation solution:  Sterile saline   Irrigation method:  Pressure wash   Visualized foreign bodies/material removed: no   Skin repair:    Repair method:  Staples   Number of staples:  1 Approximation:    Approximation:  Close Post-procedure details:    Dressing:  Open (no dressing)   Patient tolerance of procedure:  Tolerated well, no immediate complications   (including critical care time)  Medications Ordered in ED Medications  HYDROcodone-acetaminophen (NORCO/VICODIN) 5-325 MG per tablet 1 tablet (has no administration in time range)  sodium chloride 0.9 % bolus 1,000 mL (1,000 mLs Intravenous New Bag/Given 11/01/19 0044)    ED Course  I have reviewed the triage vital signs and the nursing notes.  Pertinent labs & imaging results that were available during my care of the patient were reviewed by me and considered in my medical decision making (see chart for details).    MDM Rules/Calculators/A&P                      Patient presents following a syncopal episode and fall.  She has a laceration to the posterior scalp.  She is overall nontoxic and vital signs are reassuring.  She was notably orthostatic.  She reports a history of the same.  She specifically gets very dizzy and has passed out with position changes.  She was given a fluid bolus.  EKG without ischemic or arrhythmic changes.  Doubt cardiac syncope.  Basic lab work obtained.  No significant anemia.  She does have some mild hypokalemia.  CT head does not show any evidence of intracranial bleed or traumatic injury.  Laceration was repaired at the bedside.  Patient is ambulatory at discharge.  She was given instructions for follow-up and suture removal.  I also advised that she  needs to be very careful when changing positions given her ongoing syncopal episodes and orthostasis.  After history, exam, and medical workup I  feel the patient has been appropriately medically screened and is safe for discharge home. Pertinent diagnoses were discussed with the patient. Patient was given return precautions.   Final Clinical Impression(s) / ED Diagnoses Final diagnoses:  Fall, initial encounter  Orthostatic hypotension  Laceration of scalp, initial encounter  Syncope, unspecified syncope type    Rx / DC Orders ED Discharge Orders    None       Wilkie Aye, Mayer Masker, MD 11/01/19 [phone]

## 2019-10-31 NOTE — ED Triage Notes (Signed)
Syncopal episode tonight at home. 2cm laceration to left posterior head. States she thinks she got up too fast.

## 2019-11-01 LAB — CBC WITH DIFFERENTIAL/PLATELET
Abs Immature Granulocytes: 0.04 10*3/uL (ref 0.00–0.07)
Basophils Absolute: 0.1 10*3/uL (ref 0.0–0.1)
Basophils Relative: 1 %
Eosinophils Absolute: 0 10*3/uL (ref 0.0–0.5)
Eosinophils Relative: 0 %
HCT: 45.5 % (ref 36.0–46.0)
Hemoglobin: 15.7 g/dL — ABNORMAL HIGH (ref 12.0–15.0)
Immature Granulocytes: 1 %
Lymphocytes Relative: 26 %
Lymphs Abs: 2.1 10*3/uL (ref 0.7–4.0)
MCH: 31.9 pg (ref 26.0–34.0)
MCHC: 34.5 g/dL (ref 30.0–36.0)
MCV: 92.5 fL (ref 80.0–100.0)
Monocytes Absolute: 0.6 10*3/uL (ref 0.1–1.0)
Monocytes Relative: 8 %
Neutro Abs: 5.3 10*3/uL (ref 1.7–7.7)
Neutrophils Relative %: 64 %
Platelets: 297 10*3/uL (ref 150–400)
RBC: 4.92 MIL/uL (ref 3.87–5.11)
RDW: 12.8 % (ref 11.5–15.5)
WBC: 8.1 10*3/uL (ref 4.0–10.5)
nRBC: 0 % (ref 0.0–0.2)

## 2019-11-01 LAB — BASIC METABOLIC PANEL
Anion gap: 15 (ref 5–15)
BUN: 13 mg/dL (ref 8–23)
CO2: 21 mmol/L — ABNORMAL LOW (ref 22–32)
Calcium: 8.7 mg/dL — ABNORMAL LOW (ref 8.9–10.3)
Chloride: 99 mmol/L (ref 98–111)
Creatinine, Ser: 0.88 mg/dL (ref 0.44–1.00)
GFR calc Af Amer: >60 mL/min (ref >60)
GFR calc non Af Amer: >60 mL/min (ref >60)
Glucose, Bld: 111 mg/dL — ABNORMAL HIGH (ref 70–99)
Potassium: 3.1 mmol/L — ABNORMAL LOW (ref 3.5–5.1)
Sodium: 135 mmol/L (ref 135–145)

## 2019-11-01 LAB — URINALYSIS, ROUTINE W REFLEX MICROSCOPIC
Bilirubin Urine: NEGATIVE
Glucose, UA: NEGATIVE mg/dL
Ketones, ur: NEGATIVE mg/dL
Leukocytes,Ua: NEGATIVE
Nitrite: NEGATIVE
Protein, ur: NEGATIVE mg/dL
Specific Gravity, Urine: <1.005 — ABNORMAL LOW (ref 1.005–1.030)
pH: 6 (ref 5.0–8.0)

## 2019-11-01 LAB — URINALYSIS, MICROSCOPIC (REFLEX)

## 2019-11-01 MED ORDER — HYDROCODONE-ACETAMINOPHEN 5-325 MG PO TABS
1.0000 | ORAL_TABLET | Freq: Once | ORAL | Status: AC
  Administered 2019-11-01: 1 via ORAL
  Filled 2019-11-01: qty 1

## 2019-11-01 NOTE — Discharge Instructions (Addendum)
You were seen today after a fall.  You likely passed out because your blood pressure dropped upon standing.  You need to make sure that you are being very careful when changing positions quickly to prevent further syncopal episodes.  Your laceration was repaired with a staple.  This needs to be removed in 7 to 10 days.  CT head was negative.  Make sure that you are staying hydrated.  Follow-up with your primary physician.

## 2020-01-07 LAB — HM DEXA SCAN

## 2021-12-12 ENCOUNTER — Other Ambulatory Visit: Payer: Self-pay

## 2021-12-12 ENCOUNTER — Emergency Department (HOSPITAL_BASED_OUTPATIENT_CLINIC_OR_DEPARTMENT_OTHER)
Admission: EM | Admit: 2021-12-12 | Discharge: 2021-12-12 | Disposition: A | Discharge status: Home / Self Care | Payer: Medicare Other | Attending: Emergency Medicine | Admitting: Emergency Medicine

## 2021-12-12 ENCOUNTER — Emergency Department (HOSPITAL_BASED_OUTPATIENT_CLINIC_OR_DEPARTMENT_OTHER): Payer: Medicare Other

## 2021-12-12 ENCOUNTER — Encounter (HOSPITAL_BASED_OUTPATIENT_CLINIC_OR_DEPARTMENT_OTHER): Payer: Self-pay | Admitting: Emergency Medicine

## 2021-12-12 DIAGNOSIS — Z20822 Contact with and (suspected) exposure to covid-19: Secondary | ICD-10-CM | POA: Diagnosis not present

## 2021-12-12 DIAGNOSIS — R197 Diarrhea, unspecified: Secondary | ICD-10-CM | POA: Diagnosis not present

## 2021-12-12 DIAGNOSIS — Z79899 Other long term (current) drug therapy: Secondary | ICD-10-CM | POA: Insufficient documentation

## 2021-12-12 DIAGNOSIS — I10 Essential (primary) hypertension: Secondary | ICD-10-CM | POA: Insufficient documentation

## 2021-12-12 DIAGNOSIS — F17200 Nicotine dependence, unspecified, uncomplicated: Secondary | ICD-10-CM | POA: Insufficient documentation

## 2021-12-12 DIAGNOSIS — I6503 Occlusion and stenosis of bilateral vertebral arteries: Secondary | ICD-10-CM | POA: Diagnosis not present

## 2021-12-12 DIAGNOSIS — E876 Hypokalemia: Secondary | ICD-10-CM | POA: Insufficient documentation

## 2021-12-12 DIAGNOSIS — R42 Dizziness and giddiness: Secondary | ICD-10-CM | POA: Diagnosis not present

## 2021-12-12 DIAGNOSIS — G319 Degenerative disease of nervous system, unspecified: Secondary | ICD-10-CM | POA: Diagnosis not present

## 2021-12-12 HISTORY — DX: Noninfective gastroenteritis and colitis, unspecified: K52.9

## 2021-12-12 LAB — PROTIME-INR
INR: 1 (ref 0.8–1.2)
Prothrombin Time: 12.8 seconds (ref 11.4–15.2)

## 2021-12-12 LAB — COMPREHENSIVE METABOLIC PANEL
ALT: 37 U/L (ref 0–44)
AST: 40 U/L (ref 15–41)
Albumin: 3.6 g/dL (ref 3.5–5.0)
Alkaline Phosphatase: 79 U/L (ref 38–126)
Anion gap: 11 (ref 5–15)
BUN: 15 mg/dL (ref 8–23)
CO2: 25 mmol/L (ref 22–32)
Calcium: 9.3 mg/dL (ref 8.9–10.3)
Chloride: 95 mmol/L — ABNORMAL LOW (ref 98–111)
Creatinine, Ser: 0.82 mg/dL (ref 0.44–1.00)
GFR, Estimated: >60 mL/min (ref >60)
Glucose, Bld: 114 mg/dL — ABNORMAL HIGH (ref 70–99)
Potassium: 3 mmol/L — ABNORMAL LOW (ref 3.5–5.1)
Sodium: 131 mmol/L — ABNORMAL LOW (ref 135–145)
Total Bilirubin: 0.6 mg/dL (ref 0.3–1.2)
Total Protein: 6.8 g/dL (ref 6.5–8.1)

## 2021-12-12 LAB — LIPASE, BLOOD: Lipase: 29 U/L (ref 11–51)

## 2021-12-12 LAB — TROPONIN I (HIGH SENSITIVITY)
Troponin I (High Sensitivity): 7 ng/L (ref <18)
Troponin I (High Sensitivity): 7 ng/L (ref <18)

## 2021-12-12 LAB — BRAIN NATRIURETIC PEPTIDE: B Natriuretic Peptide: 29.7 pg/mL (ref 0.0–100.0)

## 2021-12-12 LAB — TSH: TSH: 2.643 u[IU]/mL (ref 0.350–4.500)

## 2021-12-12 LAB — CBC
HCT: 39.7 % (ref 36.0–46.0)
Hemoglobin: 14.1 g/dL (ref 12.0–15.0)
MCH: 33.5 pg (ref 26.0–34.0)
MCHC: 35.5 g/dL (ref 30.0–36.0)
MCV: 94.3 fL (ref 80.0–100.0)
Platelets: 317 10*3/uL (ref 150–400)
RBC: 4.21 MIL/uL (ref 3.87–5.11)
RDW: 12.7 % (ref 11.5–15.5)
WBC: 9.7 10*3/uL (ref 4.0–10.5)
nRBC: 0 % (ref 0.0–0.2)

## 2021-12-12 LAB — RESP PANEL BY RT-PCR (FLU A&B, COVID) ARPGX2
Influenza A by PCR: NEGATIVE
Influenza B by PCR: NEGATIVE
SARS Coronavirus 2 by RT PCR: NEGATIVE

## 2021-12-12 MED ORDER — SODIUM CHLORIDE 0.9 % IV BOLUS
500.0000 mL | Freq: Once | INTRAVENOUS | Status: AC
Start: 2021-12-12 — End: 2021-12-12
  Administered 2021-12-12: 500 mL via INTRAVENOUS

## 2021-12-12 MED ORDER — LIDOCAINE 5 % EX PTCH
1.0000 | MEDICATED_PATCH | CUTANEOUS | Status: DC
Start: 2021-12-12 — End: 2021-12-12
  Administered 2021-12-12: 1 via TRANSDERMAL
  Filled 2021-12-12: qty 1

## 2021-12-12 MED ORDER — POTASSIUM CHLORIDE CRYS ER 20 MEQ PO TBCR
40.0000 meq | EXTENDED_RELEASE_TABLET | Freq: Once | ORAL | Status: AC
  Administered 2021-12-12: 40 meq via ORAL

## 2021-12-12 MED ORDER — SODIUM CHLORIDE 0.9 % IV SOLN: 12.5000 mg | Freq: Once | INTRAVENOUS | Status: DC

## 2021-12-12 MED ORDER — POTASSIUM CHLORIDE CRYS ER 20 MEQ PO TBCR: 20.0000 meq | EXTENDED_RELEASE_TABLET | Freq: Every day | ORAL | 0 refills | Status: DC

## 2021-12-12 MED ORDER — ACETAMINOPHEN 325 MG PO TABS
650.0000 mg | ORAL_TABLET | Freq: Once | ORAL | Status: AC
  Administered 2021-12-12: 650 mg via ORAL
  Filled 2021-12-12: qty 2

## 2021-12-12 MED ORDER — GADOBUTROL 1 MMOL/ML IV SOLN
7.1000 mL | Freq: Once | INTRAVENOUS | Status: AC | PRN
Start: 2021-12-12 — End: 2021-12-12
  Administered 2021-12-12: 7.1 mL via INTRAVENOUS

## 2021-12-12 MED ORDER — POTASSIUM CHLORIDE CRYS ER 20 MEQ PO TBCR
40.0000 meq | EXTENDED_RELEASE_TABLET | Freq: Once | ORAL | Status: AC
  Administered 2021-12-12: 40 meq via ORAL
  Filled 2021-12-12: qty 2

## 2021-12-12 MED ORDER — METOCLOPRAMIDE HCL 5 MG/ML IJ SOLN
5.0000 mg | Freq: Once | INTRAMUSCULAR | Status: AC
  Administered 2021-12-12: 5 mg via INTRAVENOUS
  Filled 2021-12-12: qty 2

## 2021-12-12 MED ORDER — DIPHENHYDRAMINE HCL 50 MG/ML IJ SOLN
12.5000 mg | Freq: Once | INTRAMUSCULAR | Status: AC
  Administered 2021-12-12: 12.5 mg via INTRAVENOUS
  Filled 2021-12-12: qty 1

## 2021-12-12 NOTE — ED Notes (Signed)
ED Provider at bedside. 

## 2021-12-12 NOTE — ED Provider Notes (Signed)
[facility] EMERGENCY DEPARTMENT Provider Note   CSN: [id number] Arrival date & time: 12/12/21  1215     History {Add pertinent medical, surgical, social history, OB history to HPI:1} Chief Complaint  Patient presents with   Dizziness    Ana Johnston is a 76 y.o. female.  Patient as above with significant medical history as below, including colitis, hypertension, depression who presents to the ED with complaint of multiple complaints.  Patient reports abdominal bloating over the past few days consistent with her history of prior colitis.  Also having frequent bowel movements that is also consistent with her colitis.  No BRBPR or melena.  No nausea or vomiting.  She is tolerant of p.o. intake without difficulty.  No fevers or chills.  No significant abdominal pain.  No rashes.  No abnormal vaginal bleeding or discharge  Patient also reports that her neck is been stiff for approximately 9 days, having headache. Having some mild dizziness with the neck pain. No tinntis or hearing change.  No relief with Tylenol or Aleve.  Headache similar to prior headaches in the past but feels much worse.  She is also concerned that there is a nodule on the left side of her neck.  She has some difficulty turning her neck and describes the sensation as a cramping, sharp sensation to her neck muscles.  No fevers, no IV drug use, no neck or head trauma. No numbness or tingling.     Past Medical History:  Diagnosis Date   Colitis    Depression    Hypertension    Seasonal allergies     Past Surgical History:  Procedure Laterality Date   ABDOMINAL HYSTERECTOMY     BACK SURGERY  09/2016   CATARACT EXTRACTION, BILATERAL     CHOLECYSTECTOMY       The history is provided by the patient and a relative.  Dizziness Associated symptoms: diarrhea and headaches   Associated symptoms: no chest pain, no nausea, no palpitations, no shortness of breath and no vomiting        Home Medications Prior  to Admission medications   Medication Sig Start Date End Date Taking? Authorizing Provider  amLODipine (NORVASC) 10 MG tablet Take 10 mg by mouth daily.    [provider]  buPROPion (WELLBUTRIN SR) 150 MG 12 hr tablet Take 150 mg by mouth 2 (two) times daily. 05/19/17   [provider]  calcium-vitamin D (OSCAL WITH D) 500-200 MG-UNIT tablet Take 1 tablet by mouth.    [provider]  Cholecalciferol (VITAMIN D3) 1000 units CHEW Chew by mouth.    [provider]  clonazePAM (KLONOPIN) 0.5 MG tablet Take 0.5 mg by mouth at bedtime. 06/06/17   [provider]  Melatonin 10 MG TABS Take 30 mg by mouth.    [provider]  montelukast (SINGULAIR) 10 MG tablet Take 10 mg by mouth daily. 06/06/17   [provider]  Omega-3 Fatty Acids (FISH OIL) 1200 MG CAPS Take by mouth.    [provider]  TRAZODONE HCL PO Take 50 mg by mouth at bedtime.     [provider]  venlafaxine (EFFEXOR) 75 MG tablet Take 75 mg by mouth daily.    [provider]  vitamin E 1000 UNIT capsule Take by mouth daily.    [provider]      Allergies    Patient has no known allergies.    Review of Systems   Review of Systems  Constitutional:  Positive for fatigue. Negative for activity change and fever.  HENT:  Negative for facial swelling and trouble swallowing.   Eyes:  Negative for discharge and redness.  Respiratory:  Negative for cough and shortness of breath.   Cardiovascular:  Negative for chest pain and palpitations.  Gastrointestinal:  Positive for abdominal distention and diarrhea. Negative for abdominal pain, nausea and vomiting.  Genitourinary:  Negative for dysuria and flank pain.  Musculoskeletal:  Positive for arthralgias. Negative for back pain and gait problem.  Skin:  Negative for pallor and rash.  Neurological:  Positive for dizziness and headaches. Negative for syncope.    Physical Exam Updated Vital  Signs BP 126/87   Pulse 81   Temp 98.1 F (36.7 C) (Oral)   Resp 17   SpO2 97%  Physical Exam  ED Results / Procedures / Treatments   Labs (all labs ordered are listed, but only abnormal results are displayed) Labs Reviewed  COMPREHENSIVE METABOLIC PANEL - Abnormal; Notable for the following components:      Result Value   Sodium 131 (*)    Potassium 3.0 (*)    Chloride 95 (*)    Glucose, Bld 114 (*)    All other components within normal limits  RESP PANEL BY RT-PCR (FLU A&B, COVID) ARPGX2  CBC  LIPASE, BLOOD  PROTIME-INR  BRAIN NATRIURETIC PEPTIDE  TSH  TROPONIN I (HIGH SENSITIVITY)    EKG None  Radiology DG Chest Port 1 View  Result Date: 12/12/2021 CLINICAL DATA:  Lightheadedness. EXAM: PORTABLE CHEST 1 VIEW COMPARISON:  August 25, 2017 FINDINGS: Tortuosity of the aorta. Cardiomediastinal silhouette is normal. Mediastinal contours appear intact. There is no evidence of focal airspace consolidation, pleural effusion or pneumothorax. Osseous structures are without acute abnormality. Soft tissues are grossly normal. IMPRESSION: No active disease. Electronically Signed   By: Ted Mcalpine M.D.   On: 12/12/2021 13:39    Procedures Procedures  {Document cardiac monitor, telemetry assessment procedure when appropriate:1}  Medications Ordered in ED Medications  lidocaine (LIDODERM) 5 % 1 patch (1 patch Transdermal Patch Applied 12/12/21 1340)  potassium chloride SA (KLOR-CON M) CR tablet 40 mEq (has no administration in time range)  metoCLOPramide (REGLAN) injection 5 mg (5 mg Intravenous Given 12/12/21 1341)  sodium chloride 0.9 % bolus 500 mL (500 mLs Intravenous New Bag/Given 12/12/21 1337)  acetaminophen (TYLENOL) tablet 650 mg (650 mg Oral Given 12/12/21 1340)  diphenhydrAMINE (BENADRYL) injection 12.5 mg (12.5 mg Intravenous Given 12/12/21 1338)    ED Course/ Medical Decision Making/ A&P Clinical Course as of 12/12/21 1443  Wed Dec 12, 2021  1433 Potassium(!):  3.0 Will replace orally, she has history of hypokalemia [SG]    Clinical Course User Index [SG] Sloan Leiter, DO                           Medical Decision Making Amount and/or Complexity of Data Reviewed Labs: ordered. Decision-making details documented in ED Course. Radiology: ordered.  Risk OTC drugs. Prescription drug management.    CC: Headache, neck pain, dizziness, abdominal bloating  This patient presents to the Emergency Department for the above complaint. This involves an extensive number of treatment options and is a complaint that carries with it a high risk of complications and morbidity. Vital signs were reviewed. Serious etiologies considered.  Differential diagnosis includes but is not exclusive to subarachnoid hemorrhage, meningitis, encephalitis, previous head trauma, cavernous venous thrombosis, muscle tension headache, glaucoma,  temporal arteritis, migraine or migraine equivalent, vascular abnormality, CVA, MSK, other acute etiologies were considered.   Patient has chronic diarrhea, roughly unchanged from baseline; denies recent antibiotic use  Record review:  Previous records obtained and reviewed prior office visits, prior ED visits, labs and imaging  Additional history obtained from sister  Medical and surgical history as noted above.   Work up as above, notable for:  Labs & imaging results that were available during my care of the patient were visualized by me and considered in my medical decision making.  Physical exam as above.   I ordered imaging studies which included ***. I visualized the imaging, interpreted images, and I agree with radiologist interpretation. ***  Cardiac monitoring reviewed and interpreted personally which shows ***   Personally discussed patient care with consultant; ***  Management: ***  ED Course: Clinical Course as of 12/12/21 1446  Wed Dec 12, 2021  1433 Potassium(!): 3.0 Will replace orally, she has history  of hypokalemia [SG]    Clinical Course User Index [SG] Sloan Leiter, DO     Reassessment:  ***  Admission was considered.                Social determinants of health include -  Social History   Socioeconomic History   Marital status: Widowed    Spouse name: Not on file   Number of children: Not on file   Years of education: Not on file   Highest education level: Not on file  Occupational History   Occupation: retired  Tobacco Use   Smoking status: Every Day   Smokeless tobacco: Never   Tobacco comments:    1/2 ppd x 50+ years  Substance and Sexual Activity   Alcohol use: Yes    Comment: 2 glasses of wine a day   Drug use: No   Sexual activity: Not on file  Other Topics Concern   Not on file  Social History Narrative   Not on file   Social Determinants of Health   Financial Resource Strain: Not on file  Food Insecurity: Not on file  Transportation Needs: Not on file  Physical Activity: Not on file  Stress: Not on file  Social Connections: Not on file  Intimate Partner Violence: Not on file      This chart was dictated using voice recognition software.  Despite best efforts to proofread,  errors can occur which can change the documentation meaning.   {Document critical care time when appropriate:1} {Document review of labs and clinical decision tools ie heart score, Chads2Vasc2 etc:1}  {Document your independent review of radiology images, and any outside records:1} {Document your discussion with family members, caretakers, and with consultants:1} {Document social determinants of health affecting pt's care:1} {Document your decision making why or why not admission, treatments were needed:1} Final Clinical Impression(s) / ED Diagnoses Final diagnoses:  None    Rx / DC Orders ED Discharge Orders     None

## 2021-12-12 NOTE — ED Notes (Signed)
Patient transported to MRI 

## 2021-12-12 NOTE — ED Triage Notes (Signed)
Pt reports HA and lightheadedness since last night. Also having generalized body swelling and noticed artery on L side of neck is larger than normal.

## 2021-12-12 NOTE — ED Notes (Signed)
Unable to update patient vitals Pt in MRI. Will update when patient returns.

## 2021-12-12 NOTE — ED Provider Notes (Signed)
Signed out to me is pending MRI of the brain and MR angio of the neck.  These did not show any evidence of acute stroke.  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  On my exam the patient has no focal deficit is ambulatory without assistance just using her cane.  I discussed these findings with on-call neurology, they recommended continued outpatient management of findings, to follow-up with neurosurgery.  I advised immediate return for worsening symptoms fevers pain or any additional concerns, otherwise to follow up with neurosurgery in 1 week.   Cheryll Cockayne, MD 12/12/21 [phone]

## 2021-12-12 NOTE — Discharge Instructions (Addendum)
It was a pleasure caring for you today in the emergency department.  Please return to the emergency department for any worsening or worrisome symptoms.  Please follow-up with your primary care doctor regarding your history of low thiamine and B12 levels.  Call them to make an appointment this week.  Your MRI scans also were concerning for narrowing in the blood vessels in your brain involving the vertebral arteries on both sides.  I have put in a request for you to be followed up by neurosurgery.  If you do not hear a call back from them within the next 2 to 4 days please call them to follow-up.  If your symptoms worsen or if you have any additional concerns return immediately back to the ER.

## 2021-12-17 DIAGNOSIS — I6503 Occlusion and stenosis of bilateral vertebral arteries: Secondary | ICD-10-CM | POA: Diagnosis not present

## 2021-12-17 DIAGNOSIS — I10 Essential (primary) hypertension: Secondary | ICD-10-CM | POA: Diagnosis not present

## 2021-12-17 DIAGNOSIS — R197 Diarrhea, unspecified: Secondary | ICD-10-CM | POA: Diagnosis not present

## 2021-12-17 DIAGNOSIS — E876 Hypokalemia: Secondary | ICD-10-CM | POA: Diagnosis not present

## 2021-12-27 DIAGNOSIS — R197 Diarrhea, unspecified: Secondary | ICD-10-CM | POA: Diagnosis not present

## 2021-12-27 DIAGNOSIS — I10 Essential (primary) hypertension: Secondary | ICD-10-CM | POA: Diagnosis not present

## 2021-12-27 DIAGNOSIS — J449 Chronic obstructive pulmonary disease, unspecified: Secondary | ICD-10-CM | POA: Diagnosis not present

## 2021-12-27 DIAGNOSIS — F411 Generalized anxiety disorder: Secondary | ICD-10-CM | POA: Diagnosis not present

## 2021-12-27 DIAGNOSIS — Z0000 Encounter for general adult medical examination without abnormal findings: Secondary | ICD-10-CM | POA: Diagnosis not present

## 2021-12-27 DIAGNOSIS — F32A Depression, unspecified: Secondary | ICD-10-CM | POA: Diagnosis not present

## 2021-12-27 DIAGNOSIS — Z79899 Other long term (current) drug therapy: Secondary | ICD-10-CM | POA: Diagnosis not present

## 2021-12-27 LAB — CBC AND DIFFERENTIAL
HCT: 44 (ref 36–46)
Hemoglobin: 14.7 (ref 12.0–16.0)
Neutrophils Absolute: 4.7
Platelets: 347 10*3/uL (ref 150–400)
WBC: 7.6

## 2021-12-27 LAB — HEPATIC FUNCTION PANEL
ALT: 36 U/L — AB (ref 7–35)
AST: 30 (ref 13–35)
Alkaline Phosphatase: 105 (ref 25–125)
Bilirubin, Total: 0.4

## 2021-12-27 LAB — COMPREHENSIVE METABOLIC PANEL
Albumin: 4.1 (ref 3.5–5.0)
Calcium: 10.3 (ref 8.7–10.7)
Globulin: 2.5

## 2021-12-27 LAB — LIPID PANEL
Cholesterol: 187 (ref 0–200)
HDL: 58 (ref 35–70)
LDL Cholesterol: 108
LDl/HDL Ratio: 1.9
Triglycerides: 119 (ref 40–160)

## 2021-12-27 LAB — BASIC METABOLIC PANEL
BUN: 15 (ref 4–21)
CO2: 26 — AB (ref 13–22)
Chloride: 94 — AB (ref 99–108)
Creatinine: 0.7 (ref 0.5–1.1)
Glucose: 95
Potassium: 3.8 meq/L (ref 3.5–5.1)
Sodium: 135 — AB (ref 137–147)

## 2021-12-27 LAB — TSH: TSH: 2.75 (ref 0.41–5.90)

## 2021-12-27 LAB — HM AWV

## 2021-12-27 LAB — CBC: RBC: 4.49 (ref 3.87–5.11)

## 2022-01-01 DIAGNOSIS — R197 Diarrhea, unspecified: Secondary | ICD-10-CM | POA: Diagnosis not present

## 2022-01-01 DIAGNOSIS — I10 Essential (primary) hypertension: Secondary | ICD-10-CM | POA: Diagnosis not present

## 2022-01-02 DIAGNOSIS — R197 Diarrhea, unspecified: Secondary | ICD-10-CM | POA: Diagnosis not present

## 2022-01-03 DIAGNOSIS — R197 Diarrhea, unspecified: Secondary | ICD-10-CM | POA: Diagnosis not present

## 2022-01-04 DIAGNOSIS — R197 Diarrhea, unspecified: Secondary | ICD-10-CM | POA: Diagnosis not present

## 2022-02-08 DIAGNOSIS — Z86010 Personal history of colonic polyps: Secondary | ICD-10-CM | POA: Diagnosis not present

## 2022-02-08 DIAGNOSIS — R197 Diarrhea, unspecified: Secondary | ICD-10-CM | POA: Diagnosis not present

## 2022-02-08 DIAGNOSIS — I10 Essential (primary) hypertension: Secondary | ICD-10-CM | POA: Diagnosis not present

## 2022-02-19 IMAGING — CT CT HEAD W/O CM
3 series · 14 of 46 positions shown, 16 images · non-contrast
Comparison: None.

CLINICAL DATA: 2 cm laceration to the posterior head, syncope

EXAM:
CT HEAD WITHOUT CONTRAST
TECHNIQUE: Contiguous axial images were obtained from the base of the skull
through the vertex without intravenous contrast.

[Series 2: head wo · axial · 0.37mm/px · z∈[+120,+240]mm · 8 of 29 slices shown, 10 images]
[im 3/29  brain]
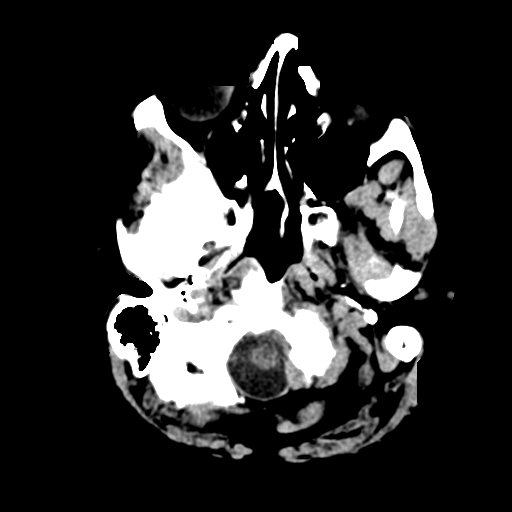
[im 3/29  bone]
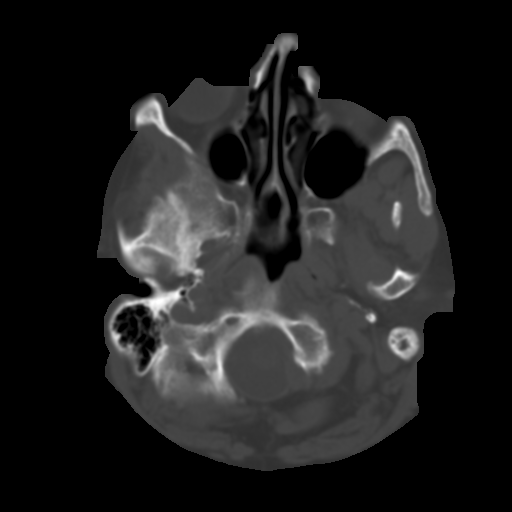
[im 7/29  brain]
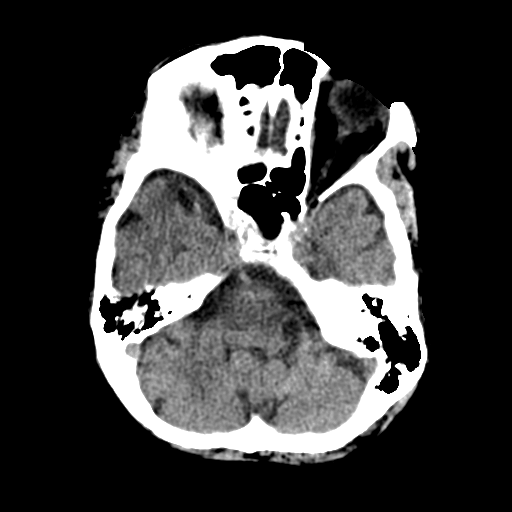
[im 10/29  brain]
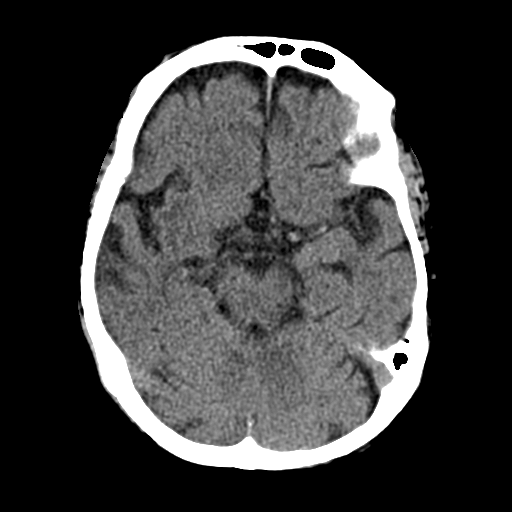
[im 13/29  brain]
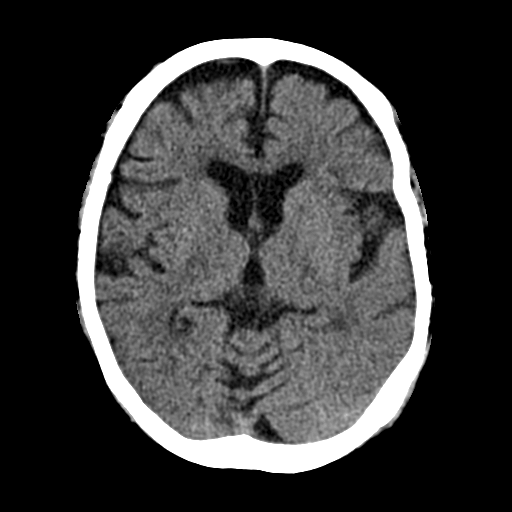
[im 17/29  brain]
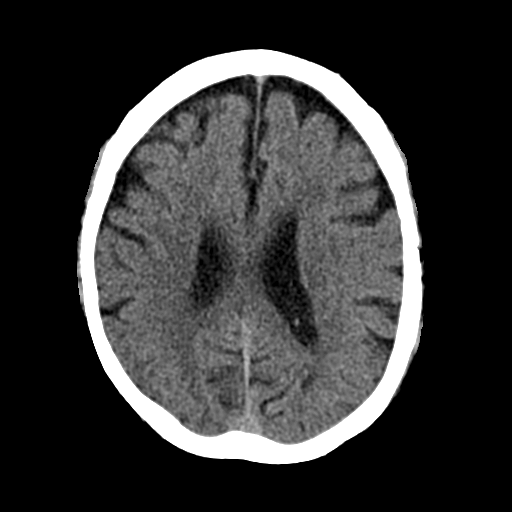
[im 17/29  bone]
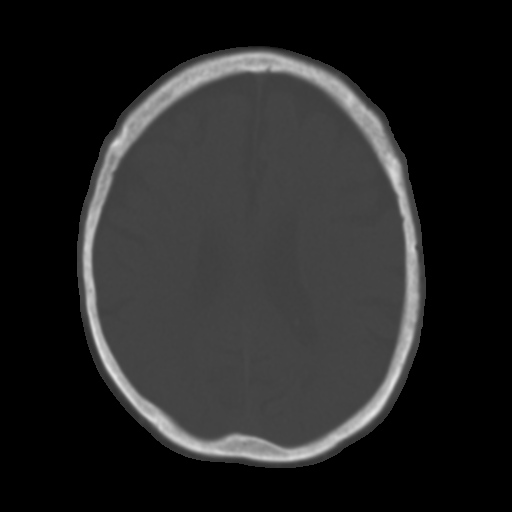
[im 20/29  brain]
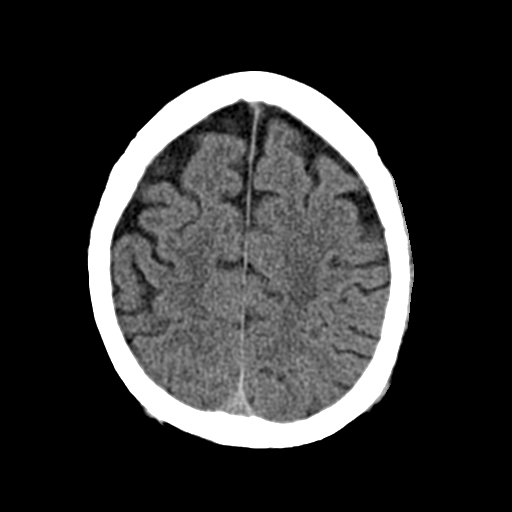
[im 23/29  brain]
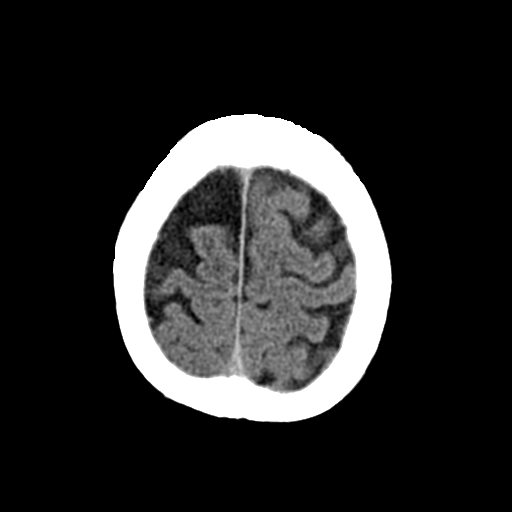
[im 27/29  brain]
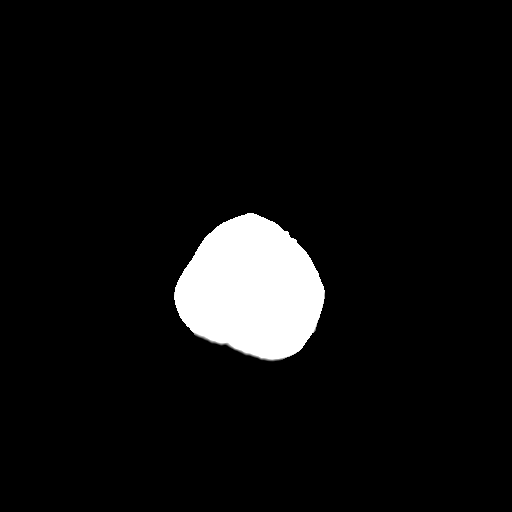

[Series 4: cor soft · coronal · 0.28mm/px · 3 of 61 slices shown]
[im 21/61  brain]
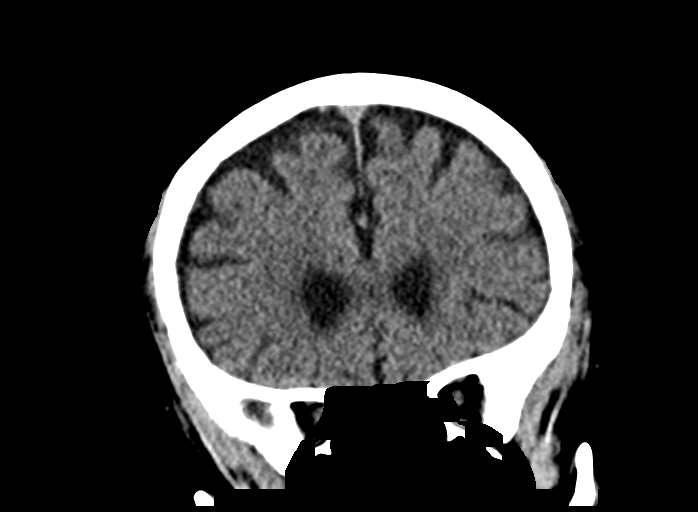
[im 27/61  brain]
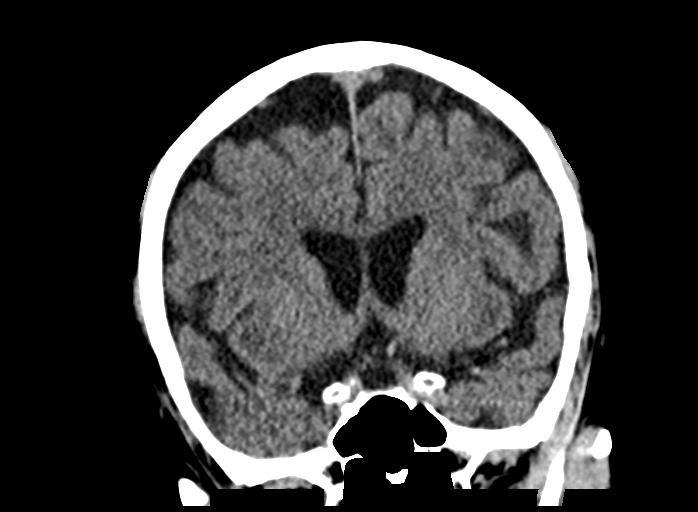
[im 34/61  brain]
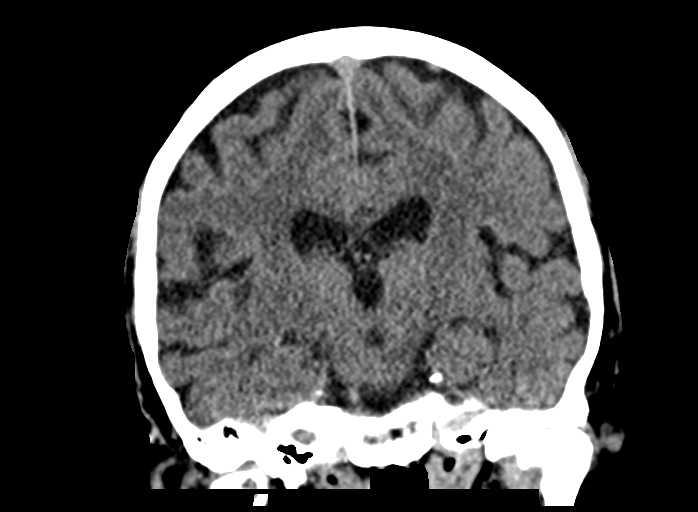

[Series 5: sag soft · sagittal · 0.27mm/px · 3 of 54 slices shown]
[im 18/54  brain]
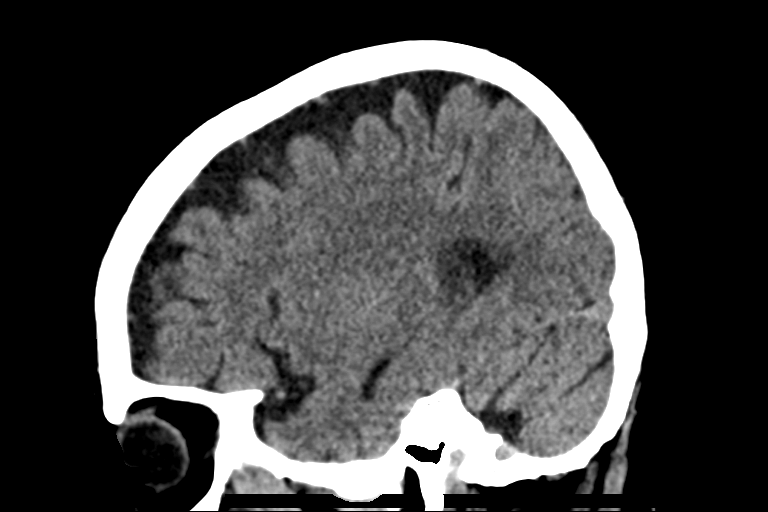
[im 27/54  brain]
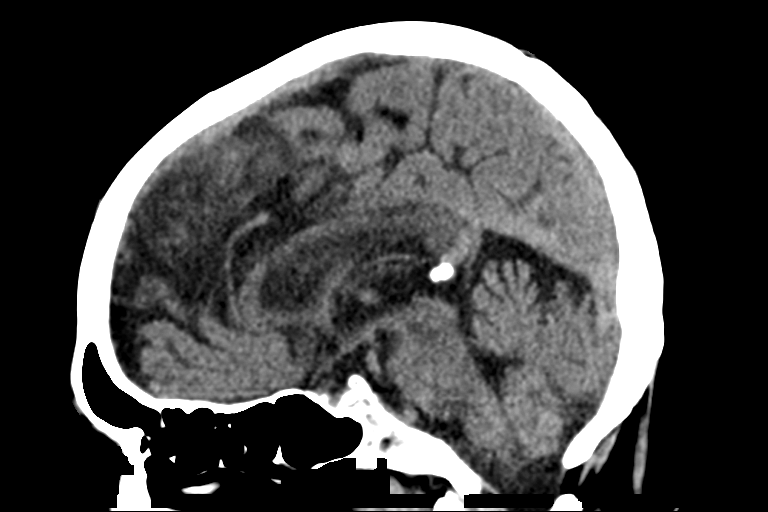
[im 36/54  brain]
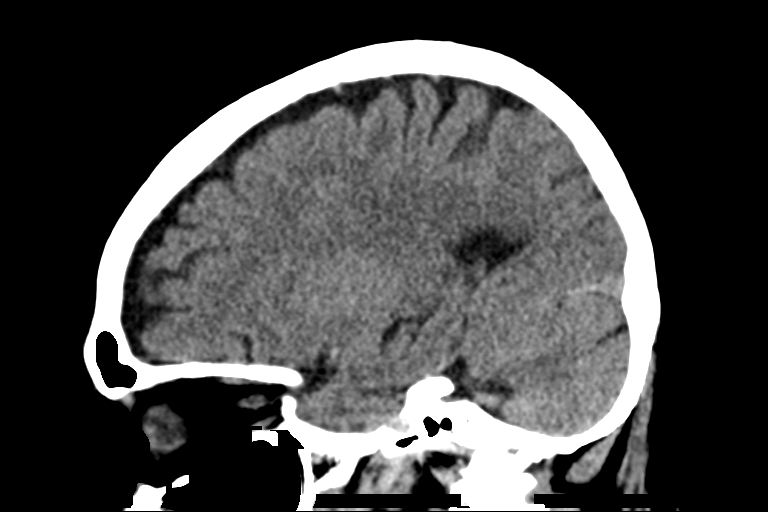

[14 of 46 positions shown; findings below may reference images not displayed]

FINDINGS: Brain: No evidence of acute territorial infarction, hemorrhage,
hydrocephalus,extra-axial collection or mass lesion/mass effect.
There is dilatation the ventricles and sulci consistent with
age-related atrophy. Low-attenuation changes in the deep white
matter consistent with small vessel ischemia.

Vascular: No hyperdense vessel or unexpected calcification.

Skull: The skull is intact. No fracture or focal lesion identified.

Sinuses/Orbits: The visualized paranasal sinuses and mastoid air
cells are clear. The orbits and globes intact.

Other: Mild soft tissue swelling with a small hematoma seen
overlying the left parietal skull.
IMPRESSION: No acute intracranial abnormality.

Findings consistent with age related atrophy and chronic small
vessel ischemia

## 2022-03-19 DIAGNOSIS — J440 Chronic obstructive pulmonary disease with acute lower respiratory infection: Secondary | ICD-10-CM | POA: Diagnosis not present

## 2022-03-19 DIAGNOSIS — Z79899 Other long term (current) drug therapy: Secondary | ICD-10-CM | POA: Diagnosis not present

## 2022-03-19 DIAGNOSIS — J209 Acute bronchitis, unspecified: Secondary | ICD-10-CM | POA: Diagnosis not present

## 2022-03-19 DIAGNOSIS — R0602 Shortness of breath: Secondary | ICD-10-CM | POA: Diagnosis not present

## 2022-03-19 DIAGNOSIS — R059 Cough, unspecified: Secondary | ICD-10-CM | POA: Diagnosis not present

## 2022-03-19 DIAGNOSIS — I10 Essential (primary) hypertension: Secondary | ICD-10-CM | POA: Diagnosis not present

## 2022-03-22 DIAGNOSIS — R079 Chest pain, unspecified: Secondary | ICD-10-CM | POA: Diagnosis not present

## 2022-03-22 DIAGNOSIS — R0602 Shortness of breath: Secondary | ICD-10-CM | POA: Diagnosis not present

## 2022-03-22 DIAGNOSIS — E876 Hypokalemia: Secondary | ICD-10-CM | POA: Diagnosis not present

## 2022-03-22 DIAGNOSIS — Z87891 Personal history of nicotine dependence: Secondary | ICD-10-CM | POA: Diagnosis not present

## 2022-03-22 DIAGNOSIS — R7309 Other abnormal glucose: Secondary | ICD-10-CM | POA: Diagnosis not present

## 2022-03-22 DIAGNOSIS — R899 Unspecified abnormal finding in specimens from other organs, systems and tissues: Secondary | ICD-10-CM | POA: Diagnosis not present

## 2022-04-09 ENCOUNTER — Ambulatory Visit: Payer: Medicare Other | Attending: Cardiovascular Disease | Admitting: Cardiovascular Disease

## 2022-04-09 ENCOUNTER — Encounter: Payer: Self-pay | Admitting: Cardiovascular Disease

## 2022-04-09 VITALS — BP 128/70 | HR 80 | Ht 60.0 in | Wt 154.6 lb

## 2022-04-09 DIAGNOSIS — I10 Essential (primary) hypertension: Secondary | ICD-10-CM | POA: Insufficient documentation

## 2022-04-09 DIAGNOSIS — Z8249 Family history of ischemic heart disease and other diseases of the circulatory system: Secondary | ICD-10-CM

## 2022-04-09 DIAGNOSIS — R0789 Other chest pain: Secondary | ICD-10-CM

## 2022-04-09 DIAGNOSIS — R0609 Other forms of dyspnea: Secondary | ICD-10-CM

## 2022-04-09 DIAGNOSIS — J439 Emphysema, unspecified: Secondary | ICD-10-CM

## 2022-04-09 DIAGNOSIS — J449 Chronic obstructive pulmonary disease, unspecified: Secondary | ICD-10-CM | POA: Insufficient documentation

## 2022-04-09 HISTORY — DX: Other chest pain: R07.89

## 2022-04-09 HISTORY — DX: Other forms of dyspnea: R06.09

## 2022-04-09 NOTE — Assessment & Plan Note (Signed)
History of essential hypertension blood pressure measured today at 128/70.  She is on amlodipine.

## 2022-04-09 NOTE — Progress Notes (Signed)
93[birth year]07[birth year]55 Ana Johnston   [birth year]  [id number]  Primary Physician Jettie Booze, NP Primary Cardiologist: Lorretta Harp MD [name], [place]  HPI:  Ana Johnston is a 76 y.o. moderately overweight widowed Caucasian female mother of 2 children, grandmother of 2 grandchildren who is accompanied by her daughter-in-law Ana Johnston today.  She was referred by [facility] emergency room because recent visit due to shortness of breath.  She apparently had an elevated D-dimer and CRP level.  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  Risk factors include 40 pack years tobacco abuse having quit 4 years ago with COPD.  She has treated hypertension as well as family history of heart disease the father who died of an MI in his 17s and a brother who had bypass surgery as well.  She is never had a heart attack or stroke.  She does complain of increasing shortness of breath over the last several months as well as atypical chest pain.  She was recently seen at [facility] emergency room with shortness of breath.  D-dimer was apparently elevated as was CRP.  She also had hypokalemia and hypomagnesemia for unclear reasons.   Current Meds  Medication Sig   amLODipine (NORVASC) 10 MG tablet Take 10 mg by mouth daily.   buPROPion (WELLBUTRIN SR) 150 MG 12 hr tablet Take 150 mg by mouth 2 (two) times daily.   calcium-vitamin D (OSCAL WITH D) 500-200 MG-UNIT tablet Take 1 tablet by mouth.   Cholecalciferol (VITAMIN D3) 1000 units CHEW Chew by mouth.   clonazePAM (KLONOPIN) 0.5 MG tablet Take 0.5 mg by mouth at bedtime.   Melatonin 10 MG TABS Take 30 mg by mouth.   montelukast (SINGULAIR) 10 MG tablet Take 10 mg by mouth daily.   Omega-3 Fatty Acids (FISH OIL) 1200 MG CAPS Take by mouth.   potassium chloride SA (KLOR-CON M) 20 MEQ tablet Take 1 tablet (20 mEq total) by mouth daily for 2 days.   TRAZODONE HCL PO Take 50 mg by mouth at bedtime.    venlafaxine (EFFEXOR) 75 MG tablet  Take 75 mg by mouth daily.   vitamin E 1000 UNIT capsule Take by mouth daily.     No Known Allergies  Social History   Socioeconomic History   Marital status: Widowed    Spouse name: Not on file   Number of children: Not on file   Years of education: Not on file   Highest education level: Not on file  Occupational History   Occupation: retired  Tobacco Use   Smoking status: Every Day   Smokeless tobacco: Never   Tobacco comments:    1/2 ppd x 50+ years  Substance and Sexual Activity   Alcohol use: Yes    Comment: 2 glasses of wine a day   Drug use: No   Sexual activity: Not on file  Other Topics Concern   Not on file  Social History Narrative   Not on file   Social Determinants of Health   Financial Resource Strain: Not on file  Food Insecurity: Not on file  Transportation Needs: Not on file  Physical Activity: Not on file  Stress: Not on file  Social Connections: Not on file  Intimate Partner Violence: Not on file     Review of Systems: General: negative for chills, fever, night sweats or weight changes.  Cardiovascular: negative for chest pain, dyspnea on exertion, edema, orthopnea, palpitations, paroxysmal nocturnal dyspnea or  shortness of breath Dermatological: negative for rash Respiratory: negative for cough or wheezing Urologic: negative for hematuria Abdominal: negative for nausea, vomiting, diarrhea, bright red blood per rectum, melena, or hematemesis Neurologic: negative for visual changes, syncope, or dizziness All other systems reviewed and are otherwise negative except as noted above.    Blood pressure 128/70, pulse 80, height 5' (1.524 m), weight 154 lb 9.6 oz (70.1 kg), SpO2 96 %.  General appearance: alert and no distress Neck: no adenopathy, no carotid bruit, no JVD, supple, symmetrical, trachea midline, and thyroid not enlarged, symmetric, no tenderness/mass/nodules Lungs: clear to auscultation bilaterally Heart: regular rate and rhythm, S1,  S2 normal, no murmur, click, rub or gallop Extremities: extremities normal, atraumatic, no cyanosis or edema Pulses: 2+ and symmetric Skin: Skin color, texture, turgor normal. No rashes or lesions Neurologic: Grossly normal  EKG sinus rhythm at 80 with minimal voltage criteria for LVH and nonspecific ST and T wave changes.  She also had septal Q waves.  I personally reviewed this EKG.  ASSESSMENT AND PLAN:   Essential hypertension History of essential hypertension blood pressure measured today at 128/70.  She is on amlodipine.  COPD (chronic obstructive pulmonary disease) (HCC) Long history tobacco abuse having smoked 40 pack years and stopped 4 years ago.  She does have a diagnosis of COPD.  Atypical chest pain Patient complains of occasional atypical chest pain/pressure.  She does have positive cardiac risk factors.  I am going to get a coronary calcium score to further evaluate  Dyspnea on exertion History of dyspnea on exertion over the last several months.  She does have a 40-pack-year history tobacco abuse and COPD.  I am going to get a 2D echo and a coronary calcium score to further evaluate.  Family history of heart disease Father had CABG in his 80s, brother had CABG as well.     Runell Gess MD FACP,FACC,FAHA, [facility] 04/09/2022 2:17 PM

## 2022-04-09 NOTE — Assessment & Plan Note (Signed)
Long history tobacco abuse having smoked 40 pack years and stopped 4 years ago.  She does have a diagnosis of COPD.

## 2022-04-09 NOTE — Assessment & Plan Note (Signed)
Father had CABG in his 50s, brother had CABG as well.

## 2022-04-09 NOTE — Assessment & Plan Note (Signed)
History of dyspnea on exertion over the last several months.  She does have a 40-pack-year history tobacco abuse and COPD.  I am going to get a 2D echo and a coronary calcium score to further evaluate.

## 2022-04-09 NOTE — Patient Instructions (Signed)
Medication Instructions:  Your physician recommends that you continue on your current medications as directed. Please refer to the Current Medication list given to you today.  *If you need a refill on your cardiac medications before your next appointment, please call your pharmacy*   Testing/Procedures: Your physician has requested that you have an echocardiogram. Echocardiography is a painless test that uses sound waves to create images of your heart. It provides your doctor with information about the size and shape of your heart and how well your heart's chambers and valves are working. This procedure takes approximately one hour. There are no restrictions for this procedure. Please do NOT wear cologne, perfume, aftershave, or lotions (deodorant is allowed). Please arrive 15 minutes prior to your appointment time. This procedure will be done at [address] [place], [place] [address].    Dr. Gwenlyn Found has ordered a CT coronary calcium score.   Test location:   [facility] [place], [place] [address])   This is $99 out of pocket.   Coronary CalciumScan A coronary calcium scan is an imaging test used to look for deposits of calcium and other fatty materials (plaques) in the inner lining of the blood vessels of the heart (coronary arteries). These deposits of calcium and plaques can partly clog and narrow the coronary arteries without producing any symptoms or warning signs. This puts a person at risk for a heart attack. This test can detect these deposits before symptoms develop. Tell a health care provider about: Any allergies you have. All medicines you are taking, including vitamins, herbs, eye drops, creams, and over-the-counter medicines. Any problems you or family members have had with anesthetic medicines. Any blood disorders you have. Any surgeries you have had. Any medical conditions you have. Whether you are pregnant or may be  pregnant. What are the risks? Generally, this is a safe procedure. However, problems may occur, including: Harm to a pregnant woman and her unborn baby. This test involves the use of radiation. Radiation exposure can be dangerous to a pregnant woman and her unborn baby. If you are pregnant, you generally should not have this procedure done. Slight increase in the risk of cancer. This is because of the radiation involved in the test. What happens before the procedure? No preparation is needed for this procedure. What happens during the procedure? You will undress and remove any jewelry around your neck or chest. You will put on a hospital gown. Sticky electrodes will be placed on your chest. The electrodes will be connected to an electrocardiogram (ECG) machine to record a tracing of the electrical activity of your heart. A CT scanner will take pictures of your heart. During this time, you will be asked to lie still and hold your breath for 2-3 seconds while a picture of your heart is being taken. The procedure may vary among health care providers and hospitals. What happens after the procedure? You can get dressed. You can return to your normal activities. It is up to you to get the results of your test. Ask your health care provider, or the department that is doing the test, when your results will be ready. Summary A coronary calcium scan is an imaging test used to look for deposits of calcium and other fatty materials (plaques) in the inner lining of the blood vessels of the heart (coronary arteries). Generally, this is a safe procedure. Tell your health care provider if you are pregnant or may be pregnant. No  preparation is needed for this procedure. A CT scanner will take pictures of your heart. You can return to your normal activities after the scan is done. This information is not intended to replace advice given to you by your health care provider. Make sure you discuss any questions  you have with your health care provider. Document Released: 11/16/2007 Document Revised: 04/08/2016 Document Reviewed: 04/08/2016 Elsevier Interactive Patient Education  2017 [employer].    Follow-Up: At [facility], you and your health needs are our priority.  As part of our continuing mission to provide you with exceptional heart care, we have created designated Provider Care Teams.  These Care Teams include your primary Cardiologist (physician) and Advanced Practice Providers (APPs -  Physician Assistants and Nurse Practitioners) who all work together to provide you with the care you need, when you need it.  We recommend signing up for the patient portal called "MyChart".  Sign up information is provided on this After Visit Summary.  MyChart is used to connect with patients for Virtual Visits (Telemedicine).  Patients are able to view lab/test results, encounter notes, upcoming appointments, etc.  Non-urgent messages can be sent to your provider as well.   To learn more about what you can do with MyChart, go to [email or web address].    Your next appointment:   6 month(s)  The format for your next appointment:   In Person  Provider:   Nanetta Batty, MD

## 2022-04-09 NOTE — Assessment & Plan Note (Signed)
Patient complains of occasional atypical chest pain/pressure.  She does have positive cardiac risk factors.  I am going to get a coronary calcium score to further evaluate

## 2022-04-17 DIAGNOSIS — F411 Generalized anxiety disorder: Secondary | ICD-10-CM | POA: Diagnosis present

## 2022-04-17 DIAGNOSIS — F331 Major depressive disorder, recurrent, moderate: Secondary | ICD-10-CM | POA: Diagnosis not present

## 2022-04-18 ENCOUNTER — Ambulatory Visit (HOSPITAL_BASED_OUTPATIENT_CLINIC_OR_DEPARTMENT_OTHER)
Admission: RE | Admit: 2022-04-18 | Discharge: 2022-04-18 | Disposition: A | Discharge status: Home / Self Care | Payer: Medicare Other | Source: Ambulatory Visit | Attending: Cardiovascular Disease | Admitting: Cardiovascular Disease

## 2022-04-18 DIAGNOSIS — R0609 Other forms of dyspnea: Secondary | ICD-10-CM | POA: Insufficient documentation

## 2022-04-18 DIAGNOSIS — E876 Hypokalemia: Secondary | ICD-10-CM | POA: Diagnosis not present

## 2022-04-18 DIAGNOSIS — I10 Essential (primary) hypertension: Secondary | ICD-10-CM | POA: Insufficient documentation

## 2022-04-18 DIAGNOSIS — J449 Chronic obstructive pulmonary disease, unspecified: Secondary | ICD-10-CM | POA: Diagnosis not present

## 2022-04-18 DIAGNOSIS — R790 Abnormal level of blood mineral: Secondary | ICD-10-CM | POA: Diagnosis not present

## 2022-04-18 DIAGNOSIS — J439 Emphysema, unspecified: Secondary | ICD-10-CM | POA: Insufficient documentation

## 2022-04-18 DIAGNOSIS — J42 Unspecified chronic bronchitis: Secondary | ICD-10-CM | POA: Diagnosis not present

## 2022-04-18 DIAGNOSIS — R0789 Other chest pain: Secondary | ICD-10-CM | POA: Insufficient documentation

## 2022-04-18 DIAGNOSIS — Z8249 Family history of ischemic heart disease and other diseases of the circulatory system: Secondary | ICD-10-CM | POA: Insufficient documentation

## 2022-04-30 ENCOUNTER — Telehealth: Payer: Self-pay

## 2022-04-30 DIAGNOSIS — R931 Abnormal findings on diagnostic imaging of heart and coronary circulation: Secondary | ICD-10-CM

## 2022-04-30 DIAGNOSIS — R0789 Other chest pain: Secondary | ICD-10-CM

## 2022-04-30 MED ORDER — METOPROLOL TARTRATE 100 MG PO TABS: 100.0000 mg | ORAL_TABLET | Freq: Once | ORAL | 0 refills | Status: DC

## 2022-04-30 NOTE — Telephone Encounter (Signed)
-----   Message from Runell Gess, MD sent at 04/18/2022  8:31 PM EST ----- Based on CCS 284, please get cor CTA

## 2022-04-30 NOTE — Telephone Encounter (Signed)
Spoke with pt's daughter, who says I can reach pt on the home number listed to discuss results. Spoke with pt regarding coronary calcium score results and Dr. Hazle Coca recommendations. Pt is ok to move forward with coronary CTA. Explained procedure and instructions for procedure over the phone. Will also mail instructions to pt's home address. Pt is aware of blood work needed and will obtain this in the next week or so. Explained to pt about the need for lopressor for procedure and will send this to pt's pharmacy of choice. Pt would like to note for scheduling purposes it is best to call her daughter's cell phone. I will note this in the order as well. Pt verbalizes understanding.

## 2022-05-01 ENCOUNTER — Ambulatory Visit (INDEPENDENT_AMBULATORY_CARE_PROVIDER_SITE_OTHER): Payer: Medicare Other

## 2022-05-01 DIAGNOSIS — I10 Essential (primary) hypertension: Secondary | ICD-10-CM

## 2022-05-01 DIAGNOSIS — R0609 Other forms of dyspnea: Secondary | ICD-10-CM

## 2022-05-01 DIAGNOSIS — R0789 Other chest pain: Secondary | ICD-10-CM | POA: Diagnosis not present

## 2022-05-01 DIAGNOSIS — J439 Emphysema, unspecified: Secondary | ICD-10-CM | POA: Diagnosis not present

## 2022-05-01 DIAGNOSIS — Z8249 Family history of ischemic heart disease and other diseases of the circulatory system: Secondary | ICD-10-CM

## 2022-05-01 LAB — ECHOCARDIOGRAM COMPLETE
Area-P 1/2: 3.99 cm2
S' Lateral: 3.5 cm

## 2022-05-01 NOTE — Telephone Encounter (Signed)
Patient's daughter is returning call.

## 2022-05-01 NOTE — Telephone Encounter (Signed)
Spoke with pt's daughter regarding instructions for coronary CTA. Reviewed instructions with daughter and all questions answered. Daughter verbalizes understanding.

## 2022-05-01 NOTE — Telephone Encounter (Signed)
Patient's daughter is following up, requesting to speak directly with Dr. Hazle Coca nurse again if possible. She would like to go over instructions again if possible.

## 2022-05-01 NOTE — Telephone Encounter (Signed)
Left message for pt's daughter to call back.

## 2022-05-02 DIAGNOSIS — F411 Generalized anxiety disorder: Secondary | ICD-10-CM | POA: Diagnosis not present

## 2022-05-02 DIAGNOSIS — F331 Major depressive disorder, recurrent, moderate: Secondary | ICD-10-CM | POA: Diagnosis not present

## 2022-05-03 DIAGNOSIS — Z1231 Encounter for screening mammogram for malignant neoplasm of breast: Secondary | ICD-10-CM | POA: Diagnosis not present

## 2022-05-03 DIAGNOSIS — E876 Hypokalemia: Secondary | ICD-10-CM | POA: Diagnosis not present

## 2022-05-03 DIAGNOSIS — R92333 Mammographic heterogeneous density, bilateral breasts: Secondary | ICD-10-CM | POA: Diagnosis not present

## 2022-05-03 DIAGNOSIS — R790 Abnormal level of blood mineral: Secondary | ICD-10-CM | POA: Diagnosis not present

## 2022-05-09 ENCOUNTER — Telehealth (HOSPITAL_COMMUNITY): Payer: Self-pay | Admitting: Emergency Medicine

## 2022-05-09 NOTE — Telephone Encounter (Signed)
Reaching out to patient to offer assistance regarding upcoming cardiac imaging study; pt verbalizes understanding of appt date/time, parking situation and where to check in, pre-test NPO status and medications ordered, and verified current allergies; name and call back number provided for further questions should they arise Rockwell Alexandria RN Navigator Cardiac Imaging [facility] [phone] office [phone] cell  Arrival 1230  Denies iv issues 100mg  metoprolol tartrate

## 2022-05-10 ENCOUNTER — Ambulatory Visit (HOSPITAL_COMMUNITY)
Admission: RE | Admit: 2022-05-10 | Discharge: 2022-05-10 | Disposition: A | Discharge status: Home / Self Care | Payer: Medicare Other | Source: Ambulatory Visit | Attending: Cardiovascular Disease | Admitting: Cardiovascular Disease

## 2022-05-10 DIAGNOSIS — I2510 Atherosclerotic heart disease of native coronary artery without angina pectoris: Secondary | ICD-10-CM | POA: Diagnosis not present

## 2022-05-10 DIAGNOSIS — R931 Abnormal findings on diagnostic imaging of heart and coronary circulation: Secondary | ICD-10-CM | POA: Diagnosis not present

## 2022-05-10 DIAGNOSIS — K760 Fatty (change of) liver, not elsewhere classified: Secondary | ICD-10-CM | POA: Diagnosis not present

## 2022-05-10 DIAGNOSIS — R0789 Other chest pain: Secondary | ICD-10-CM

## 2022-05-10 MED ORDER — IOHEXOL 350 MG/ML SOLN
100.0000 mL | Freq: Once | INTRAVENOUS | Status: AC | PRN
  Administered 2022-05-10: 100 mL via INTRAVENOUS

## 2022-05-10 MED ORDER — NITROGLYCERIN 0.4 MG SL SUBL
SUBLINGUAL_TABLET | SUBLINGUAL | Status: AC
  Filled 2022-05-10: qty 2

## 2022-05-10 MED ORDER — NITROGLYCERIN 0.4 MG SL SUBL
0.8000 mg | SUBLINGUAL_TABLET | Freq: Once | SUBLINGUAL | Status: AC
  Administered 2022-05-10: 0.8 mg via SUBLINGUAL

## 2022-05-10 MED ORDER — METOPROLOL TARTRATE 5 MG/5ML IV SOLN
INTRAVENOUS | Status: AC
  Filled 2022-05-10: qty 10

## 2022-05-10 MED ORDER — METOPROLOL TARTRATE 5 MG/5ML IV SOLN
10.0000 mg | Freq: Once | INTRAVENOUS | Status: AC
  Administered 2022-05-10: 10 mg via INTRAVENOUS

## 2022-05-13 ENCOUNTER — Ambulatory Visit (HOSPITAL_BASED_OUTPATIENT_CLINIC_OR_DEPARTMENT_OTHER)
Admission: RE | Admit: 2022-05-13 | Discharge: 2022-05-13 | Disposition: A | Discharge status: Home / Self Care | Payer: Medicare Other | Source: Ambulatory Visit | Attending: Cardiovascular Disease | Admitting: Cardiovascular Disease

## 2022-05-13 ENCOUNTER — Encounter: Payer: Self-pay | Admitting: Cardiovascular Disease

## 2022-05-13 ENCOUNTER — Telehealth: Payer: Self-pay | Admitting: Cardiology

## 2022-05-13 ENCOUNTER — Encounter (HOSPITAL_BASED_OUTPATIENT_CLINIC_OR_DEPARTMENT_OTHER): Payer: Self-pay

## 2022-05-13 DIAGNOSIS — R9389 Abnormal findings on diagnostic imaging of other specified body structures: Secondary | ICD-10-CM

## 2022-05-13 DIAGNOSIS — I2699 Other pulmonary embolism without acute cor pulmonale: Secondary | ICD-10-CM | POA: Diagnosis not present

## 2022-05-13 MED ORDER — IOHEXOL 350 MG/ML SOLN
100.0000 mL | Freq: Once | INTRAVENOUS | Status: AC | PRN
  Administered 2022-05-13: 60 mL via INTRAVENOUS

## 2022-05-13 NOTE — Telephone Encounter (Signed)
Ana Johnston from [facility] states Dr. Crissie Reese is requesting to speak with DOD.

## 2022-05-13 NOTE — Telephone Encounter (Signed)
error 

## 2022-05-13 NOTE — Telephone Encounter (Signed)
Dr Jens Som spoke with the radiologist dr Crissie Reese,  dr wyle indicated there is an 80% chance the patient has a pulmonary embolus but the study is not good enough to confirm and he recommends CTA for PE protocol. Patient and daughter both aware. Spoke with central scheduling, CTA scheduled for today at the [facility] location.

## 2022-05-13 NOTE — Telephone Encounter (Signed)
Spoke with pt's daughter, Selena Batten (ok per [facility]) regarding positive results of PE study. Per Dr. Allyson Sabal, will start pt on Xarelto PE starter pack and will see her in clinic tomorrow. Gave instructions to daughter.   Sample pack and additional bottles of xarelto 20mg  placed a front desk for daughter to pick up today.   Daughter verbalizes understanding.  Medication samples have been provided to the patient.  Drug name: Xarelto PE Starter Pack  Qty: 1  LOT:  21MG [id number] Exp.Date: 07/2022   Drug name: Xarelto 20mg  Qty: 2 bottles  LOT: 04-30-2002 Exp.Date: 03/2024   Samples left at front desk for patient pick-up.

## 2022-05-14 ENCOUNTER — Ambulatory Visit: Payer: Medicare Other | Attending: Cardiovascular Disease | Admitting: Cardiovascular Disease

## 2022-05-14 ENCOUNTER — Encounter: Payer: Self-pay | Admitting: Cardiovascular Disease

## 2022-05-14 VITALS — BP 108/74 | HR 77 | Ht 60.0 in | Wt 151.0 lb

## 2022-05-14 DIAGNOSIS — R9389 Abnormal findings on diagnostic imaging of other specified body structures: Secondary | ICD-10-CM

## 2022-05-14 DIAGNOSIS — R931 Abnormal findings on diagnostic imaging of heart and coronary circulation: Secondary | ICD-10-CM

## 2022-05-14 MED ORDER — RIVAROXABAN (XARELTO) VTE STARTER PACK (15 & 20 MG): ORAL_TABLET | ORAL | 0 refills | Status: DC

## 2022-05-14 MED ORDER — RIVAROXABAN 20 MG PO TABS: 20.0000 mg | ORAL_TABLET | Freq: Every day | ORAL | 2 refills | Status: DC

## 2022-05-14 MED ORDER — ATORVASTATIN CALCIUM 20 MG PO TABS: 20.0000 mg | ORAL_TABLET | Freq: Every day | ORAL | 3 refills | Status: DC

## 2022-05-14 NOTE — Addendum Note (Signed)
Addended by: Bernita Buffy on: 05/14/2022 03:27 PM   Modules accepted: Orders

## 2022-05-14 NOTE — Progress Notes (Signed)
Ana Johnston returns today for follow-up.  She is accompanied by her daughter-in-law Ana Johnston again today.  I did a coronary calcium score which was elevated at 335.  Subsequent coronary CTA showed nonobstructive disease.  He she did ever have a filling defect in her pulmonary artery vasculature right lower lobe consistent with a subacute pulmonary embolism.  She also had a 2D echo that revealed normal LV/RV systolic function.  She had no valvular abnormalities.  Apparently she had a D-dimer draw 10/17 which was 1020 although when she went to the ER it was back down to baseline.  They did not do a chest CT.  Her CRP was elevated at 23 as well.  Based on her chest CTA I began her on Xarelto starter pack.  I am also going to get lower extremity venous Doppler studies on her.  Her most recent lipid profile performed 4 months ago revealed total cholesterol 187, LDL 108 and HDL of 58.  I am going to start her on atorvastatin 20 mg a day and we will recheck a lipid liver profile in 3 months.  I will see her back after that and follow-up.  Ana Johnston, M.D., FACP, [facility], [facility] [address] [place], [place]  [address]  [phone] 05/14/2022 3:35 PM

## 2022-05-14 NOTE — Patient Instructions (Signed)
Medication Instructions:  Your physician has recommended you make the following change in your medication:   -Start atorvastatin (lipitor) 20mg  once daily.   *If you need a refill on your cardiac medications before your next appointment, please call your pharmacy*   Lab Work: Your physician recommends that you return for lab work in: 3 months for FASTING lipid/liver panel.  If you have labs (blood work) drawn today and your tests are completely normal, you will receive your results only by: MyChart Message (if you have MyChart) OR A paper copy in the mail If you have any lab test that is abnormal or we need to change your treatment, we will call you to review the results.   Testing/Procedures: Your physician has requested that you have a lower extremity venous duplex. This test is an ultrasound of the veins in the legs. It looks at venous blood flow that carries blood from the heart to the legs. Allow one hour for a Lower Venous exam. There are no restrictions or special instructions. This procedure will be done at [address] [facility]     Follow-Up: At [facility], you and your health needs are our priority.  As part of our continuing mission to provide you with exceptional heart care, we have created designated Provider Care Teams.  These Care Teams include your primary Cardiologist (physician) and Advanced Practice Providers (APPs -  Physician Assistants and Nurse Practitioners) who all work together to provide you with the care you need, when you need it.  We recommend signing up for the patient portal called "MyChart".  Sign up information is provided on this After Visit Summary.  MyChart is used to connect with patients for Virtual Visits (Telemedicine).  Patients are able to view lab/test results, encounter notes, upcoming appointments, etc.  Non-urgent messages can be sent to your provider as well.   To learn more about what you can do with MyChart, go to  [facility].    Your next appointment:   3 month(s)  The format for your next appointment:   In Person  Provider:   [email or web address], MD

## 2022-05-15 ENCOUNTER — Ambulatory Visit (HOSPITAL_COMMUNITY)
Admission: RE | Admit: 2022-05-15 | Discharge: 2022-05-15 | Disposition: A | Discharge status: Home / Self Care | Payer: Medicare Other | Source: Ambulatory Visit | Attending: Cardiovascular Disease | Admitting: Cardiovascular Disease

## 2022-05-15 DIAGNOSIS — R9389 Abnormal findings on diagnostic imaging of other specified body structures: Secondary | ICD-10-CM | POA: Insufficient documentation

## 2022-05-15 DIAGNOSIS — R931 Abnormal findings on diagnostic imaging of heart and coronary circulation: Secondary | ICD-10-CM | POA: Diagnosis not present

## 2022-05-17 DIAGNOSIS — R0602 Shortness of breath: Secondary | ICD-10-CM | POA: Diagnosis not present

## 2022-05-17 DIAGNOSIS — R0989 Other specified symptoms and signs involving the circulatory and respiratory systems: Secondary | ICD-10-CM | POA: Diagnosis not present

## 2022-05-17 DIAGNOSIS — B002 Herpesviral gingivostomatitis and pharyngotonsillitis: Secondary | ICD-10-CM | POA: Diagnosis not present

## 2022-05-17 DIAGNOSIS — R059 Cough, unspecified: Secondary | ICD-10-CM | POA: Diagnosis not present

## 2022-05-17 DIAGNOSIS — I2699 Other pulmonary embolism without acute cor pulmonale: Secondary | ICD-10-CM | POA: Diagnosis present

## 2022-05-17 DIAGNOSIS — J441 Chronic obstructive pulmonary disease with (acute) exacerbation: Secondary | ICD-10-CM | POA: Diagnosis not present

## 2022-05-17 HISTORY — DX: Other pulmonary embolism without acute cor pulmonale: I26.99

## 2022-05-20 DIAGNOSIS — R0989 Other specified symptoms and signs involving the circulatory and respiratory systems: Secondary | ICD-10-CM | POA: Diagnosis not present

## 2022-05-24 ENCOUNTER — Inpatient Hospital Stay (HOSPITAL_COMMUNITY)
Admission: EM | Admit: 2022-05-24 | Discharge: 2022-05-27 | DRG: 683 | Disposition: A | Discharge status: Home / Self Care | Payer: Medicare Other | Attending: Internal Medicine | Admitting: Internal Medicine

## 2022-05-24 ENCOUNTER — Encounter (HOSPITAL_COMMUNITY): Payer: Self-pay

## 2022-05-24 ENCOUNTER — Emergency Department (HOSPITAL_COMMUNITY): Payer: Medicare Other

## 2022-05-24 DIAGNOSIS — I2699 Other pulmonary embolism without acute cor pulmonale: Secondary | ICD-10-CM | POA: Diagnosis present

## 2022-05-24 DIAGNOSIS — Z8249 Family history of ischemic heart disease and other diseases of the circulatory system: Secondary | ICD-10-CM

## 2022-05-24 DIAGNOSIS — F32A Depression, unspecified: Secondary | ICD-10-CM | POA: Diagnosis present

## 2022-05-24 DIAGNOSIS — Z7901 Long term (current) use of anticoagulants: Secondary | ICD-10-CM

## 2022-05-24 DIAGNOSIS — A0472 Enterocolitis due to Clostridium difficile, not specified as recurrent: Secondary | ICD-10-CM | POA: Diagnosis present

## 2022-05-24 DIAGNOSIS — Z86711 Personal history of pulmonary embolism: Secondary | ICD-10-CM | POA: Diagnosis not present

## 2022-05-24 DIAGNOSIS — Z1152 Encounter for screening for COVID-19: Secondary | ICD-10-CM | POA: Diagnosis not present

## 2022-05-24 DIAGNOSIS — F17200 Nicotine dependence, unspecified, uncomplicated: Secondary | ICD-10-CM | POA: Diagnosis not present

## 2022-05-24 DIAGNOSIS — K5790 Diverticulosis of intestine, part unspecified, without perforation or abscess without bleeding: Secondary | ICD-10-CM | POA: Diagnosis not present

## 2022-05-24 DIAGNOSIS — I700 Atherosclerosis of aorta: Secondary | ICD-10-CM | POA: Diagnosis not present

## 2022-05-24 DIAGNOSIS — I10 Essential (primary) hypertension: Secondary | ICD-10-CM | POA: Diagnosis present

## 2022-05-24 DIAGNOSIS — J4489 Other specified chronic obstructive pulmonary disease: Secondary | ICD-10-CM | POA: Diagnosis not present

## 2022-05-24 DIAGNOSIS — F411 Generalized anxiety disorder: Secondary | ICD-10-CM | POA: Diagnosis present

## 2022-05-24 DIAGNOSIS — N179 Acute kidney failure, unspecified: Secondary | ICD-10-CM | POA: Diagnosis not present

## 2022-05-24 DIAGNOSIS — K219 Gastro-esophageal reflux disease without esophagitis: Secondary | ICD-10-CM | POA: Diagnosis present

## 2022-05-24 DIAGNOSIS — Z79899 Other long term (current) drug therapy: Secondary | ICD-10-CM

## 2022-05-24 DIAGNOSIS — R58 Hemorrhage, not elsewhere classified: Secondary | ICD-10-CM | POA: Diagnosis not present

## 2022-05-24 DIAGNOSIS — F419 Anxiety disorder, unspecified: Secondary | ICD-10-CM | POA: Diagnosis not present

## 2022-05-24 DIAGNOSIS — Z803 Family history of malignant neoplasm of breast: Secondary | ICD-10-CM

## 2022-05-24 DIAGNOSIS — Z833 Family history of diabetes mellitus: Secondary | ICD-10-CM

## 2022-05-24 DIAGNOSIS — R197 Diarrhea, unspecified: Secondary | ICD-10-CM | POA: Diagnosis not present

## 2022-05-24 DIAGNOSIS — Z90710 Acquired absence of both cervix and uterus: Secondary | ICD-10-CM | POA: Diagnosis not present

## 2022-05-24 DIAGNOSIS — E860 Dehydration: Secondary | ICD-10-CM | POA: Diagnosis not present

## 2022-05-24 DIAGNOSIS — R109 Unspecified abdominal pain: Secondary | ICD-10-CM | POA: Diagnosis not present

## 2022-05-24 DIAGNOSIS — J449 Chronic obstructive pulmonary disease, unspecified: Secondary | ICD-10-CM | POA: Diagnosis present

## 2022-05-24 LAB — URINALYSIS, ROUTINE W REFLEX MICROSCOPIC
Bilirubin Urine: NEGATIVE
Glucose, UA: NEGATIVE mg/dL
Hgb urine dipstick: NEGATIVE
Ketones, ur: NEGATIVE mg/dL
Leukocytes,Ua: NEGATIVE
Nitrite: NEGATIVE
Protein, ur: NEGATIVE mg/dL
Specific Gravity, Urine: 1.01 (ref 1.005–1.030)
pH: 5 (ref 5.0–8.0)

## 2022-05-24 LAB — COMPREHENSIVE METABOLIC PANEL
ALT: 17 U/L (ref 0–44)
AST: 21 U/L (ref 15–41)
Albumin: 3.5 g/dL (ref 3.5–5.0)
Alkaline Phosphatase: 80 U/L (ref 38–126)
Anion gap: 11 (ref 5–15)
BUN: 35 mg/dL — ABNORMAL HIGH (ref 8–23)
CO2: 21 mmol/L — ABNORMAL LOW (ref 22–32)
Calcium: 9.3 mg/dL (ref 8.9–10.3)
Chloride: 103 mmol/L (ref 98–111)
Creatinine, Ser: 4.46 mg/dL — ABNORMAL HIGH (ref 0.44–1.00)
GFR, Estimated: 10 mL/min — ABNORMAL LOW (ref >60)
Glucose, Bld: 113 mg/dL — ABNORMAL HIGH (ref 70–99)
Potassium: 4.5 mmol/L (ref 3.5–5.1)
Sodium: 135 mmol/L (ref 135–145)
Total Bilirubin: 0.6 mg/dL (ref 0.3–1.2)
Total Protein: 7.4 g/dL (ref 6.5–8.1)

## 2022-05-24 LAB — TYPE AND SCREEN
ABO/RH(D): O NEG
Antibody Screen: NEGATIVE

## 2022-05-24 LAB — CBC
HCT: 43.7 % (ref 36.0–46.0)
Hemoglobin: 14.6 g/dL (ref 12.0–15.0)
MCH: 32.7 pg (ref 26.0–34.0)
MCHC: 33.4 g/dL (ref 30.0–36.0)
MCV: 97.8 fL (ref 80.0–100.0)
Platelets: 382 10*3/uL (ref 150–400)
RBC: 4.47 MIL/uL (ref 3.87–5.11)
RDW: 12.1 % (ref 11.5–15.5)
WBC: 8.7 10*3/uL (ref 4.0–10.5)
nRBC: 0 % (ref 0.0–0.2)

## 2022-05-24 LAB — LIPASE, BLOOD: Lipase: 66 U/L — ABNORMAL HIGH (ref 11–51)

## 2022-05-24 LAB — CREATININE, URINE, RANDOM: Creatinine, Urine: 155 mg/dL

## 2022-05-24 LAB — SODIUM, URINE, RANDOM: Sodium, Ur: 23 mmol/L

## 2022-05-24 MED ORDER — SODIUM CHLORIDE 0.9 % IV BOLUS
1000.0000 mL | Freq: Once | INTRAVENOUS | Status: AC
  Administered 2022-05-24: 1000 mL via INTRAVENOUS

## 2022-05-24 MED ORDER — SODIUM CHLORIDE 0.9 % IV SOLN: INTRAVENOUS | Status: DC

## 2022-05-24 NOTE — ED Provider Notes (Signed)
[facility]-EMERGENCY DEPT Provider Note   CSN: [id number] Arrival date & time: 05/24/22  1517     History  Chief Complaint  Patient presents with   Melena    Ana Johnston is a 76 y.o. female.  HPI Patient presenting from home via EMS.  Has been undergoing workup for longstanding diarrhea.  Briefly had a course of azithromycin for bronchitis with subsequent worsening in her diarrheal symptoms.  Was just diagnosed with C. difficile colitis about 4 to 5 days ago and started on a course of oral vancomycin.  Reports good adherence to the vancomycin and that the frequency of her stools is actually decreased since starting the medicine.  Here today with black glossy stools, vague back pain and generally feeling unwell.  She reports that she has been able to maintain decent p.o. intake.    Home Medications Prior to Admission medications   Medication Sig Start Date End Date Taking? Authorizing Provider  amLODipine (NORVASC) 10 MG tablet Take 10 mg by mouth daily.    [provider]  atorvastatin (LIPITOR) 20 MG tablet Take 1 tablet (20 mg total) by mouth daily. 05/14/22   Runell Gess, MD  buPROPion (WELLBUTRIN SR) 150 MG 12 hr tablet Take 150 mg by mouth 2 (two) times daily. 05/19/17   [provider]  calcium-vitamin D (OSCAL WITH D) 500-200 MG-UNIT tablet Take 1 tablet by mouth.    [provider]  Cholecalciferol (VITAMIN D3) 1000 units CHEW Chew by mouth.    [provider]  clonazePAM (KLONOPIN) 0.5 MG tablet Take 0.5 mg by mouth at bedtime. 06/06/17   [provider]  Melatonin 10 MG TABS Take 30 mg by mouth.    [provider]  metoprolol tartrate (LOPRESSOR) 100 MG tablet Take 1 tablet (100 mg total) by mouth once for 1 dose. Take 2 hours prior to procedure. 04/30/22 05/14/22  Runell Gess, MD  montelukast (SINGULAIR) 10 MG tablet Take 10 mg by mouth daily. 06/06/17   [provider]  Omega-3  Fatty Acids (FISH OIL) 1200 MG CAPS Take by mouth.    [provider]  potassium chloride SA (KLOR-CON M) 20 MEQ tablet Take 1 tablet (20 mEq total) by mouth daily for 2 days. 12/12/21 05/14/22  Sloan Leiter, DO  rivaroxaban (XARELTO) 20 MG TABS tablet Take 1 tablet (20 mg total) by mouth daily with supper. 05/14/22   Runell Gess, MD  RIVAROXABAN Carlena Hurl) VTE STARTER PACK (15 & 20 MG) Follow package directions: Take one 15mg  tablet by mouth twice a day. On day 22, switch to one 20mg  tablet once a day. Take with food. 05/14/22   , MD  TRAZODONE HCL PO Take 50 mg by mouth at bedtime.     [provider]  venlafaxine (EFFEXOR) 75 MG tablet Take 75 mg by mouth daily.    [provider]  vitamin E 1000 UNIT capsule Take by mouth daily.    [provider]      Allergies    Patient has no known allergies.    Review of Systems   Review of Systems  Respiratory:  Negative for chest tightness and shortness of breath.   Cardiovascular:  Negative for chest pain.  Gastrointestinal:  Positive for blood in stool, diarrhea and nausea. Negative for vomiting.    Physical Exam Updated Vital Signs BP (!) 145/84   Pulse 68   Temp 98.5 F (36.9 C)   Resp  17   SpO2 100%  Physical Exam  ED Results / Procedures / Treatments   Labs (all labs ordered are listed, but only abnormal results are displayed) Labs Reviewed  COMPREHENSIVE METABOLIC PANEL - Abnormal; Notable for the following components:      Result Value   CO2 21 (*)    Glucose, Bld 113 (*)    BUN 35 (*)    Creatinine, Ser 4.46 (*)    GFR, Estimated 10 (*)    All other components within normal limits  LIPASE, BLOOD - Abnormal; Notable for the following components:   Lipase 66 (*)    All other components within normal limits  CBC  URINALYSIS, ROUTINE W REFLEX MICROSCOPIC  SODIUM, URINE, RANDOM  CREATININE, URINE, RANDOM  POC OCCULT BLOOD, ED  TYPE AND SCREEN     EKG None  Radiology CT ABDOMEN PELVIS WO CONTRAST  Result Date: 05/24/2022 CLINICAL DATA:  Acute renal failure with abdominal pain, initial encounter EXAM: CT ABDOMEN AND PELVIS WITHOUT CONTRAST TECHNIQUE: Multidetector CT imaging of the abdomen and pelvis was performed following the standard protocol without IV contrast. RADIATION DOSE REDUCTION: This exam was performed according to the departmental dose-optimization program which includes automated exposure control, adjustment of the mA and/or kV according to patient size and/or use of iterative reconstruction technique. COMPARISON:  05/13/2022 FINDINGS: Lower chest: New patchy ground-glass opacities are noted in the lower lung fields bilaterally Hepatobiliary: Fatty infiltration of the liver is noted. Gallbladder has been surgically removed. Pancreas: Unremarkable. No pancreatic ductal dilatation or surrounding inflammatory changes. Spleen: Normal in size without focal abnormality. Adrenals/Urinary Tract: Adrenal glands are within normal limits. Kidneys are well visualize without evidence of obstruction. No renal calculi or obstructive changes are seen. The bladder is decompressed. Stomach/Bowel: Mild diverticular change of the colon is noted. Fluid is noted throughout the colon without obstructive change. The appendix is within normal limits. Stomach and small bowel are unremarkable. Vascular/Lymphatic: Aortic atherosclerosis. No enlarged abdominal or pelvic lymph nodes. Reproductive: Status post hysterectomy. No adnexal masses. Other: No abdominal wall hernia or abnormality. No abdominopelvic ascites. Musculoskeletal: Changes of prior vertebral augmentation are noted in the lower thoracic spine. Mild degenerative changes are noted. IMPRESSION: Diverticulosis without diverticulitis. Patchy ground-glass opacities within the lung bases bilaterally. These are new from the most recent exam and likely represent multifocal inflammatory change.  Electronically Signed   By: Alcide Clever M.D.   On: 05/24/2022 20:19    Procedures Procedures    Medications Ordered in ED Medications  sodium chloride 0.9 % bolus 1,000 mL (0 mLs Intravenous Stopped 05/24/22 2016)    ED Course/ Medical Decision Making/ A&P                           Medical Decision Making Amount and/or Complexity of Data Reviewed Labs: ordered. Radiology: ordered.  Risk Decision regarding hospitalization.  Patient was generalized malaise and back pain in the setting of known C. difficile infection on oral vancomycin.  Initial lab workup significant for creatinine acutely elevated at 4.46.  Per chart review, she had a normal Cr 1.04 one week ago.  Unclear what may be driving her AKI.  She has a normal urine today.  CBC is entirely unremarkable, do not suspect an infectious source.  BUN/Cr ratio 7.8 which may suggest an intrinsic renal injury such as ATN. Suspect her recently initiated vancomycin may be driving this. Not on an ACE/ARB or other common culprit medications.  CT of the abdomen and pelvis did not reveal any renal pathology.  Was positive for diverticulosis without diverticulitis and there were some patchy groundglass opacities in the lung bases bilaterally.  Initially there was some concern for blood loss given tarry stools on Xarelto, though Hgb WNL at 14.6. Discussed with Dr. Arrie Aran, nephrology, who felt that her AKI was far more likely to be related to volume depletion than oral vancomycin. Will plan to admit her for fluid resuscitation and renal function monitoring. Discussed with Dr. Artis Flock, [facility], who will see the patient for admission.   Final Clinical Impression(s) / ED Diagnoses Final diagnoses:  AKI (acute kidney injury) [facility])    Rx / DC Orders ED Discharge Orders     None      Dorothyann Gibbs, MD    Alicia Amel, MD 05/24/22 2200    Charlynne Pander, MD 05/25/22 [phone]

## 2022-05-24 NOTE — ED Triage Notes (Signed)
Pt arrived via EMS, c/o black tarry stools. Dx with C diff. On a blood thinner.

## 2022-05-24 NOTE — H&P (Signed)
History and Physical    Patient: Ana Johnston FIE:[id number] DOB: 1945/06/17 DOA: 05/24/2022 DOS: the patient was seen and examined on 05/25/2022 PCP: [facility], [place]  Patient coming from: Home - lives alone. Ambulates independently.    Chief Complaint: weakness/nausea and not feeling well.   HPI: Ana Johnston is a 76 y.o. female with medical history significant of HTN,  depression and anxiety, recent diagnosis of subacute PE on coronary CTA, COPD, GERD who presented with complaints of weakness, nausea and not feeling well. Her stool is getting better. It's not as runny but still dark and gel like in consistency. She denies any fevers, has had some chills. She has been drinking fine, but had poor PO intake. When questioned about how much water, she states not as much as she probably should. Her diarrhea has been watery since 12/11 and was going every few hours to seconds up until she was put on medication.   She tells me the morning of her CTA chest scan on 12/11 she started to have diarrhea. She was diagnosed with C.Diff on 05/21/22 and started on oral vancomycin 4x/day for 10 days. Her last dose was this morning.   Denies any fever/chills, vision changes/headaches, chest pain or palpitations, shortness of breath or cough, abdominal pain, vomiting, dysuria or leg swelling. She also states she has had some new back pain. She denies any sick contacts.    She does not smoke or drink alcohol.   ER Course:  vitals: afebrile, bp: 135/75, HR; 72, RR: 18, oxygen: 94% RA Pertinent labs: BUN; 35, creatinine: 4.46, lipase 66,  CT abdo/pelvis: patchy ground-glass opacities within the lung bases bilaterally. New from previous exam.  Diverticulosis without diverticulitis.  In ED: given 1L IVF bolus. TRH asked to admit.     Review of Systems: As mentioned in the history of present illness. All other systems reviewed and are negative. Past Medical History:  Diagnosis Date    Colitis    Depression    Hypertension    Seasonal allergies    Past Surgical History:  Procedure Laterality Date   ABDOMINAL HYSTERECTOMY     BACK SURGERY  09/2016   CATARACT EXTRACTION, BILATERAL     CHOLECYSTECTOMY     Social History:  reports that she has been smoking. She has never used smokeless tobacco. She reports current alcohol use. She reports that she does not use drugs.  No Known Allergies  Family History  Problem Relation Age of Onset   Diabetes Mother    Heart attack Father    Breast cancer Sister     Prior to Admission medications   Medication Sig Start Date End Date Taking? Authorizing Provider  amLODipine (NORVASC) 10 MG tablet Take 10 mg by mouth daily.    [provider]  atorvastatin (LIPITOR) 20 MG tablet Take 1 tablet (20 mg total) by mouth daily. 05/14/22   Runell Gess, MD  buPROPion (WELLBUTRIN SR) 150 MG 12 hr tablet Take 150 mg by mouth 2 (two) times daily. 05/19/17   [provider]  calcium-vitamin D (OSCAL WITH D) 500-200 MG-UNIT tablet Take 1 tablet by mouth.    [provider]  Cholecalciferol (VITAMIN D3) 1000 units CHEW Chew by mouth.    [provider]  clonazePAM (KLONOPIN) 0.5 MG tablet Take 0.5 mg by mouth at bedtime. 06/06/17   [provider]  Melatonin 10 MG TABS Take 30 mg by mouth.    [provider]  metoprolol tartrate (LOPRESSOR) 100 MG tablet Take 1 tablet (100 mg total) by mouth once for 1 dose. Take 2 hours prior to procedure. 04/30/22 05/14/22  Runell Gess, MD  montelukast (SINGULAIR) 10 MG tablet Take 10 mg by mouth daily. 06/06/17   [provider]  Omega-3 Fatty Acids (FISH OIL) 1200 MG CAPS Take by mouth.    [provider]  potassium chloride SA (KLOR-CON M) 20 MEQ tablet Take 1 tablet (20 mEq total) by mouth daily for 2 days. 12/12/21 05/14/22  Sloan Leiter, DO  rivaroxaban (XARELTO) 20 MG TABS tablet Take 1 tablet (20 mg total) by mouth daily  with supper. 05/14/22   Runell Gess, MD  RIVAROXABAN Carlena Hurl) VTE STARTER PACK (15 & 20 MG) Follow package directions: Take one 15mg  tablet by mouth twice a day. On day 22, switch to one 20mg  tablet once a day. Take with food. 05/14/22   , MD  TRAZODONE HCL PO Take 50 mg by mouth at bedtime.     [provider]  venlafaxine (EFFEXOR) 75 MG tablet Take 75 mg by mouth daily.    [provider]  vitamin E 1000 UNIT capsule Take by mouth daily.    [provider]    Physical Exam: Vitals:   05/24/22 2030 05/24/22 2130 05/24/22 2322 05/25/22 0054  BP: (!) 150/74 (!) 145/84  (!) 156/83  Pulse: 68 68  71  Resp: 17 17  20   Temp:   98.5 F (36.9 C) (!) 97.5 F (36.4 C)  TempSrc:    Oral  SpO2: 100% 100%  97%  Weight:    67.4 kg  Height:    5' (1.524 m)   General:  Appears calm and comfortable and is in NAD Eyes:  PERRL, EOMI, normal lids, iris ENT:  grossly normal hearing, lips & tongue, mmm; appropriate dentition Neck:  no LAD, masses or thyromegaly; no carotid bruits Cardiovascular:  RRR, no m/r/g. No LE edema.  Respiratory:   CTA bilaterally with no wheezes/rales/rhonchi.  Normal respiratory effort. Abdomen:  soft, NT, ND, NABS Back:   normal alignment, no CVAT Skin:  no rash or induration seen on limited exam Musculoskeletal:  grossly normal tone BUE/BLE, good ROM, no bony abnormality Lower extremity:  No LE edema.  Limited foot exam with no ulcerations.  2+ distal pulses. Psychiatric:  grossly normal mood and affect, speech fluent and appropriate, AOx3 Neurologic:  CN 2-12 grossly intact, moves all extremities in coordinated fashion, sensation intact   Radiological Exams on Admission: Independently reviewed - see discussion in A/P where applicable  CT ABDOMEN PELVIS WO CONTRAST  Result Date: 05/24/2022 CLINICAL DATA:  Acute renal failure with abdominal pain, initial encounter EXAM: CT ABDOMEN AND PELVIS WITHOUT CONTRAST  TECHNIQUE: Multidetector CT imaging of the abdomen and pelvis was performed following the standard protocol without IV contrast. RADIATION DOSE REDUCTION: This exam was performed according to the departmental dose-optimization program which includes automated exposure control, adjustment of the mA and/or kV according to patient size and/or use of iterative reconstruction technique. COMPARISON:  05/13/2022 FINDINGS: Lower chest: New patchy ground-glass opacities are noted in the lower lung fields bilaterally Hepatobiliary: Fatty infiltration of the liver is noted. Gallbladder has been surgically removed. Pancreas: Unremarkable. No pancreatic ductal dilatation or surrounding inflammatory changes. Spleen: Normal in size without focal abnormality. Adrenals/Urinary Tract: Adrenal glands are within normal limits. Kidneys are well visualize without evidence of obstruction. No renal calculi or obstructive changes are seen. The  bladder is decompressed. Stomach/Bowel: Mild diverticular change of the colon is noted. Fluid is noted throughout the colon without obstructive change. The appendix is within normal limits. Stomach and small bowel are unremarkable. Vascular/Lymphatic: Aortic atherosclerosis. No enlarged abdominal or pelvic lymph nodes. Reproductive: Status post hysterectomy. No adnexal masses. Other: No abdominal wall hernia or abnormality. No abdominopelvic ascites. Musculoskeletal: Changes of prior vertebral augmentation are noted in the lower thoracic spine. Mild degenerative changes are noted. IMPRESSION: Diverticulosis without diverticulitis. Patchy ground-glass opacities within the lung bases bilaterally. These are new from the most recent exam and likely represent multifocal inflammatory change. Electronically Signed   By: Alcide Clever M.D.   On: 05/24/2022 20:19    EKG: pending    Labs on Admission: I have personally reviewed the available labs and imaging studies at the time of the  admission.  Pertinent labs:   BUN; 35, creatinine: 4.46,  lipase 66,  Assessment and Plan: Principal Problem:   Acute kidney injury (HCC) Active Problems:   Clostridium difficile diarrhea   Acute pulmonary embolism without acute cor pulmonale (HCC)   Essential hypertension   COPD (chronic obstructive pulmonary disease) (HCC)   Generalized anxiety disorder    Assessment and Plan: * Acute kidney injury (HCC) 76 year old presenting with weakness, fatigue and nausea in setting of acute C.Diff infection found to have an acute kidney injury.  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  Diagnosed with C.Diff on 12/19 and started on oral vancomycin  Contact and enteric precautions Continue oral vancomycin, no effect on renal injury.  Diarrhea has improved, but still has frequent episodes and still dark in color   Acute pulmonary embolism without acute cor pulmonale (HCC) Recently found on coronary CTA-12/11. Sub acute.  Started on xarelto; however, with her acute renal injury will change to heparin gtt for now  As renal function improves can continue her xarelto starter pack.   Essential hypertension Well controlled.  Continue norvasc three times a week   COPD (chronic obstructive pulmonary disease) (HCC) No evidence of exacerbation Continue SABA prn and symbicort   Generalized anxiety disorder Continue buspar daily     Advance Care Planning:   Code Status: DNR discussed with patient   Consults: none   DVT Prophylaxis: heparin gtt   Family Communication: none   Severity of Illness: The appropriate patient status for this patient is OBSERVATION. Observation status is judged to be reasonable  and necessary in order to provide the required intensity of service to ensure the patient's safety. The patient's presenting symptoms, physical exam findings, and initial radiographic and laboratory data in the context of their medical condition is felt to place them at decreased risk for further clinical deterioration. Furthermore, it is anticipated that the patient will be medically stable for discharge from the hospital within 2 midnights of admission.   Author: Orland Mustard, MD 05/25/2022 1:12 AM  For on call review [email or web address].

## 2022-05-24 NOTE — ED Provider Triage Note (Signed)
Emergency Medicine Provider Triage Evaluation Note  Ana Johnston , a 76 y.o. female  was evaluated in triage.  Pt complains of black tarry stools.  Patient reports that she has been having diarrhea in a long time.  She reports she was diagnosed with C. difficile last week.  She reports this week she saw black tarry diarrhea.  Patient reports lower abdominal pain and lower back pain.  Patient reports she was diagnosed with a PE in the beginning of December and is on blood thinner. No fever, nausea, vomiting, urinary symptoms, chest pain, shortness of breath.  She reports feeling weak recently.  Review of Systems  Positive: As above Negative: As above  Physical Exam  BP 135/75   Pulse 69   Resp 18   SpO2 93%  Gen:   Awake, no distress   Resp:  Normal effort  MSK:   Moves extremities without difficulty  Other:    Medical Decision Making  Medically screening exam initiated at 3:48 PM.  Appropriate orders placed.  Ana Johnston was informed that the remainder of the evaluation will be completed by another provider, this initial triage assessment does not replace that evaluation, and the importance of remaining in the ED until their evaluation is complete.     Jeanelle Malling, [place] 05/24/22 1551

## 2022-05-25 ENCOUNTER — Other Ambulatory Visit: Payer: Self-pay

## 2022-05-25 ENCOUNTER — Observation Stay (HOSPITAL_COMMUNITY): Payer: Medicare Other

## 2022-05-25 DIAGNOSIS — I10 Essential (primary) hypertension: Secondary | ICD-10-CM | POA: Diagnosis present

## 2022-05-25 DIAGNOSIS — Z86711 Personal history of pulmonary embolism: Secondary | ICD-10-CM | POA: Diagnosis not present

## 2022-05-25 DIAGNOSIS — F17200 Nicotine dependence, unspecified, uncomplicated: Secondary | ICD-10-CM | POA: Diagnosis present

## 2022-05-25 DIAGNOSIS — A0472 Enterocolitis due to Clostridium difficile, not specified as recurrent: Secondary | ICD-10-CM

## 2022-05-25 DIAGNOSIS — K219 Gastro-esophageal reflux disease without esophagitis: Secondary | ICD-10-CM | POA: Diagnosis present

## 2022-05-25 DIAGNOSIS — N179 Acute kidney failure, unspecified: Principal | ICD-10-CM | POA: Diagnosis present

## 2022-05-25 DIAGNOSIS — Z833 Family history of diabetes mellitus: Secondary | ICD-10-CM | POA: Diagnosis not present

## 2022-05-25 DIAGNOSIS — Z7901 Long term (current) use of anticoagulants: Secondary | ICD-10-CM | POA: Diagnosis not present

## 2022-05-25 DIAGNOSIS — F419 Anxiety disorder, unspecified: Secondary | ICD-10-CM | POA: Diagnosis present

## 2022-05-25 DIAGNOSIS — Z90710 Acquired absence of both cervix and uterus: Secondary | ICD-10-CM | POA: Diagnosis not present

## 2022-05-25 DIAGNOSIS — Z8249 Family history of ischemic heart disease and other diseases of the circulatory system: Secondary | ICD-10-CM | POA: Diagnosis not present

## 2022-05-25 DIAGNOSIS — Z803 Family history of malignant neoplasm of breast: Secondary | ICD-10-CM | POA: Diagnosis not present

## 2022-05-25 DIAGNOSIS — J4489 Other specified chronic obstructive pulmonary disease: Secondary | ICD-10-CM | POA: Diagnosis present

## 2022-05-25 DIAGNOSIS — F32A Depression, unspecified: Secondary | ICD-10-CM | POA: Diagnosis present

## 2022-05-25 DIAGNOSIS — Z79899 Other long term (current) drug therapy: Secondary | ICD-10-CM | POA: Diagnosis not present

## 2022-05-25 DIAGNOSIS — I2699 Other pulmonary embolism without acute cor pulmonale: Secondary | ICD-10-CM | POA: Diagnosis not present

## 2022-05-25 DIAGNOSIS — K5790 Diverticulosis of intestine, part unspecified, without perforation or abscess without bleeding: Secondary | ICD-10-CM | POA: Diagnosis present

## 2022-05-25 DIAGNOSIS — F411 Generalized anxiety disorder: Secondary | ICD-10-CM | POA: Diagnosis present

## 2022-05-25 DIAGNOSIS — Z1152 Encounter for screening for COVID-19: Secondary | ICD-10-CM | POA: Diagnosis not present

## 2022-05-25 HISTORY — DX: Enterocolitis due to Clostridium difficile, not specified as recurrent: A04.72

## 2022-05-25 LAB — BASIC METABOLIC PANEL
Anion gap: 9 (ref 5–15)
BUN: 30 mg/dL — ABNORMAL HIGH (ref 8–23)
CO2: 22 mmol/L (ref 22–32)
Calcium: 9.4 mg/dL (ref 8.9–10.3)
Chloride: 106 mmol/L (ref 98–111)
Creatinine, Ser: 3.72 mg/dL — ABNORMAL HIGH (ref 0.44–1.00)
GFR, Estimated: 12 mL/min — ABNORMAL LOW (ref >60)
Glucose, Bld: 113 mg/dL — ABNORMAL HIGH (ref 70–99)
Potassium: 4.2 mmol/L (ref 3.5–5.1)
Sodium: 137 mmol/L (ref 135–145)

## 2022-05-25 LAB — APTT
aPTT: 44 seconds — ABNORMAL HIGH (ref 24–36)
aPTT: 73 seconds — ABNORMAL HIGH (ref 24–36)
aPTT: 68 seconds — ABNORMAL HIGH (ref 24–36)

## 2022-05-25 LAB — CBC
HCT: 41.6 % (ref 36.0–46.0)
Hemoglobin: 13.8 g/dL (ref 12.0–15.0)
MCH: 32.6 pg (ref 26.0–34.0)
MCHC: 33.2 g/dL (ref 30.0–36.0)
MCV: 98.3 fL (ref 80.0–100.0)
Platelets: 387 10*3/uL (ref 150–400)
RBC: 4.23 MIL/uL (ref 3.87–5.11)
RDW: 12.2 % (ref 11.5–15.5)
WBC: 8.1 10*3/uL (ref 4.0–10.5)
nRBC: 0 % (ref 0.0–0.2)

## 2022-05-25 LAB — LIPASE, BLOOD: Lipase: 57 U/L — ABNORMAL HIGH (ref 11–51)

## 2022-05-25 LAB — HEPARIN LEVEL (UNFRACTIONATED): Heparin Unfractionated: >1.1 IU/mL — ABNORMAL HIGH (ref 0.30–0.70)

## 2022-05-25 MED ORDER — AMLODIPINE BESYLATE 5 MG PO TABS
5.0000 mg | ORAL_TABLET | ORAL | Status: DC
  Administered 2022-05-27: 5 mg via ORAL
  Filled 2022-05-25: qty 1

## 2022-05-25 MED ORDER — ONDANSETRON HCL 4 MG PO TABS
4.0000 mg | ORAL_TABLET | Freq: Four times a day (QID) | ORAL | Status: DC | PRN
  Administered 2022-05-25: 4 mg via ORAL
  Filled 2022-05-25: qty 1

## 2022-05-25 MED ORDER — FLUTICASONE FUROATE-VILANTEROL 200-25 MCG/ACT IN AEPB
1.0000 | INHALATION_SPRAY | Freq: Every day | RESPIRATORY_TRACT | Status: DC
  Administered 2022-05-25 – 2022-05-27 (×3): 1 via RESPIRATORY_TRACT
  Filled 2022-05-25: qty 28

## 2022-05-25 MED ORDER — ATORVASTATIN CALCIUM 10 MG PO TABS
20.0000 mg | ORAL_TABLET | Freq: Every day | ORAL | Status: DC
  Administered 2022-05-25 – 2022-05-27 (×3): 20 mg via ORAL
  Filled 2022-05-25 (×3): qty 2

## 2022-05-25 MED ORDER — ALBUTEROL SULFATE HFA 108 (90 BASE) MCG/ACT IN AERS: 2.0000 | INHALATION_SPRAY | Freq: Four times a day (QID) | RESPIRATORY_TRACT | Status: DC | PRN

## 2022-05-25 MED ORDER — ACETAMINOPHEN 325 MG PO TABS
650.0000 mg | ORAL_TABLET | Freq: Four times a day (QID) | ORAL | Status: DC | PRN
  Administered 2022-05-25 – 2022-05-27 (×6): 650 mg via ORAL
  Filled 2022-05-25 (×7): qty 2

## 2022-05-25 MED ORDER — ALBUTEROL SULFATE (2.5 MG/3ML) 0.083% IN NEBU: 2.5000 mg | INHALATION_SOLUTION | Freq: Four times a day (QID) | RESPIRATORY_TRACT | Status: DC | PRN

## 2022-05-25 MED ORDER — VANCOMYCIN HCL 125 MG PO CAPS: 125.0000 mg | ORAL_CAPSULE | Freq: Four times a day (QID) | ORAL | Status: DC

## 2022-05-25 MED ORDER — CLOTRIMAZOLE 1 % EX SOLN
Freq: Two times a day (BID) | CUTANEOUS | Status: DC
  Filled 2022-05-25: qty 10

## 2022-05-25 MED ORDER — ACETAMINOPHEN 650 MG RE SUPP: 650.0000 mg | Freq: Four times a day (QID) | RECTAL | Status: DC | PRN

## 2022-05-25 MED ORDER — BUSPIRONE HCL 10 MG PO TABS
5.0000 mg | ORAL_TABLET | Freq: Every day | ORAL | Status: DC
  Administered 2022-05-25 – 2022-05-27 (×3): 5 mg via ORAL
  Filled 2022-05-25 (×3): qty 1

## 2022-05-25 MED ORDER — CLOTRIMAZOLE 2 % VA CREA
1.0000 | TOPICAL_CREAM | Freq: Every day | VAGINAL | Status: DC
  Filled 2022-05-25: qty 21

## 2022-05-25 MED ORDER — MONTELUKAST SODIUM 10 MG PO TABS
10.0000 mg | ORAL_TABLET | Freq: Every day | ORAL | Status: DC
  Administered 2022-05-25 – 2022-05-27 (×3): 10 mg via ORAL
  Filled 2022-05-25 (×3): qty 1

## 2022-05-25 MED ORDER — HEPARIN (PORCINE) 25000 UT/250ML-% IV SOLN
1150.0000 [IU]/h | INTRAVENOUS | Status: AC
  Administered 2022-05-25: 950 [IU]/h via INTRAVENOUS
  Administered 2022-05-26: 1150 [IU]/h via INTRAVENOUS
  Filled 2022-05-25 (×3): qty 250

## 2022-05-25 MED ORDER — SACCHAROMYCES BOULARDII 250 MG PO CAPS
250.0000 mg | ORAL_CAPSULE | Freq: Two times a day (BID) | ORAL | Status: AC
  Administered 2022-05-25 – 2022-05-26 (×2): 250 mg via ORAL
  Filled 2022-05-25 (×2): qty 1

## 2022-05-25 MED ORDER — VANCOMYCIN HCL 125 MG PO CAPS
125.0000 mg | ORAL_CAPSULE | Freq: Four times a day (QID) | ORAL | Status: DC
  Administered 2022-05-25 – 2022-05-27 (×9): 125 mg via ORAL
  Filled 2022-05-25 (×11): qty 1

## 2022-05-25 MED ORDER — CLOTRIMAZOLE 1 % VA CREA
1.0000 | TOPICAL_CREAM | Freq: Every day | VAGINAL | Status: DC
  Administered 2022-05-25 – 2022-05-26 (×2): 1 via VAGINAL
  Filled 2022-05-25: qty 45

## 2022-05-25 MED ORDER — MELATONIN 5 MG PO TABS
10.0000 mg | ORAL_TABLET | Freq: Every day | ORAL | Status: DC
  Administered 2022-05-25 – 2022-05-26 (×3): 10 mg via ORAL
  Filled 2022-05-25 (×3): qty 2

## 2022-05-25 MED ORDER — MAGNESIUM 30 MG PO TABS: 30.0000 mg | ORAL_TABLET | Freq: Every day | ORAL | Status: DC

## 2022-05-25 MED ORDER — ONDANSETRON HCL 4 MG/2ML IJ SOLN: 4.0000 mg | Freq: Four times a day (QID) | INTRAMUSCULAR | Status: DC | PRN

## 2022-05-25 NOTE — Progress Notes (Signed)
ANTICOAGULATION CONSULT NOTE - Initial Consult  Pharmacy Consult for Heparin (PTA Xarelto on hold) Indication: pulmonary embolus - diagnosed 05/13/2022  No Known Allergies  Patient Measurements: Height: 5' (152.4 cm) Weight: 67.4 kg (148 lb 9.4 oz) IBW/kg (Calculated) : 45.5 Heparin Dosing Weight: 59.5 kg  Vital Signs: Temp: 97.5 F (36.4 C) (12/23 0054) Temp Source: Oral (12/23 0054) BP: 156/83 (12/23 0054) Pulse Rate: 71 (12/23 0054)  Labs: Recent Labs    05/24/22 1557  HGB 14.6  HCT 43.7  PLT 382  CREATININE 4.46*    Estimated Creatinine Clearance: 9.2 mL/min (A) (by C-G formula based on SCr of 4.46 mg/dL (H)).   Medical History: Past Medical History:  Diagnosis Date   Colitis    Depression    Hypertension    Seasonal allergies     Medications:  PTA Xarelto starter pack: currently still on 15mg  BID with last dose taken 12/22 @ 1100  Assessment: 76 yr female admitted with acute kidney injury, recent CDiff infection, c/o tarry black stool (hgb 14.6).  Recently diagnosed with acute PE and started on Xarelto dose pack on 05/13/2022. Due to AKI (SCr = 4.46), Xarelto has been held and pharmacy consulted to dose IV heparin gtt.  Plans to resume Xarelto once kidney function returns to normal.  Goal of Therapy:  Heparin level 0.3-0.7 units/ml aPTT 66-102 seconds Monitor platelets by anticoagulation protocol: Yes   Plan:  Obtain baseline aPTT and heparin level Anticipate baseline heparin level to be elevated due to recent Xarelto and Xarelto's effect to falsely elevate heparin level.  Will monitor heparin therapy using aPTT until effects of Xarelto have worn off and aPTT and heparin levels correlate Begin IV heparin gtt @ 950 units/hr Check aPTT 8 hr after heparin gtt started Daily CBC and heparin level  Shelby Peltz, 14/04/2022, PharmD 05/25/2022,1:18 AM

## 2022-05-25 NOTE — Progress Notes (Signed)
Progress Note   Patient: Ana Johnston NTI:[id number] DOB: [birth year] DOA: 05/24/2022     0 DOS: the patient was seen and examined on 05/25/2022   Brief hospital course:  Ana Johnston is a 76 y.o. female with medical history significant of HTN,  depression and anxiety, recent diagnosis of subacute PE on coronary CTA, COPD, GERD who presented 05/24/2022 with complaints of weakness, nausea and not feeling well. Her stool is getting better, not as runny but still dark and gel like in consistency. No fevers, has had some chills. Drinking fine, but had poor PO intake. Diarrhea had been watery since 12/11 and was going every few hours to seconds up until she was put on oral vancomycin for C diff (diagnosed 05/21/22, started on oral vancomycin 4x/day for 10 days).   ER Course:  stable vitals.   Pertinent labs: BUN; 35, creatinine: 4.46, lipase 66,  CT abdomen/pelvis: patchy ground-glass opacities within the lung bases bilaterally. New from previous exam. (Patient not having any respiratory symptoms and reported recently treated with Zithromax outpatient for respiratory illness, now improved).  Diverticulosis without diverticulitis.  In ED: given 1L IVF bolus.   Admitted to the hospital and started on IV fluids for severe AKI.  Renal ultrasound was normal.   Renal function improving.  Continuing oral vancomycin for C diff infection.     Assessment and Plan: * Acute kidney injury (HCC) Cr on admission was 4.46 (baseline around 0.8).  Suspect pre-renal give her C diff infection and diarrhea, poor PO intake. Renal U/S negative. --Continue IV fluids --Renally dose meds, avoid nephrotoxic agents and hypotension, IV contrast  Clostridium difficile diarrhea Has chronic diarrhea for years, followed by GI, but acutely worse on 12/11.   Diagnosed with C.Diff on 12/19 and started on oral vancomycin  --Continue PO vanc --Enteric precautions  Acute pulmonary embolism without acute cor pulmonale  (HCC) Recently found on coronary CTA-12/11. Sub acute.  Started on xarelto; however, with her acute renal injury will change to heparin gtt for now  As renal function improves can continue her xarelto starter pack.   Essential hypertension Well controlled.  Continue norvasc three times a week   COPD (chronic obstructive pulmonary disease) (HCC) No evidence of exacerbation Continue SABA prn and symbicort   Generalized anxiety disorder Continue buspar daily   AKI (acute kidney injury) (HCC) As above        Subjective: Pt awake resting in bed when seen today.  She reports ongoing diarrhea, still watery, but might be less frequent.  Denies abdominal pain or N/V.  Reports making good amount of urine.    Physical Exam: Vitals:   05/25/22 0054 05/25/22 0415 05/25/22 0651 05/25/22 0924  BP: (!) 156/83 138/77 (!) 141/86   Pulse: 71 64 61   Resp: 20 16 18    Temp: (!) 97.5 F (36.4 C) 97.9 F (36.6 C) 97.8 F (36.6 C)   TempSrc: Oral Oral Oral   SpO2: 97% 97% 96% 96%  Weight: 67.4 kg     Height: 5' (1.524 m)      General exam: awake, alert, no acute distress HEENT: atraumatic, clear conjunctiva, anicteric sclera, moist mucus membranes, hearing grossly normal  Respiratory system: CTAB, no wheezes, rales or rhonchi, normal respiratory effort. Cardiovascular system: normal S1/S2, RRR, no JVD, murmurs, rubs, gallops, no pedal edema.   Gastrointestinal system: soft, NT, ND, no HSM felt, +bowel sounds. Central nervous system: A&O x3. no gross focal neurologic deficits, normal speech Extremities: moves all, no edema, normal  tone Skin: dry, intact, normal temperature, normal color, No rashes, lesions or ulcers Psychiatry: normal mood, congruent affect, judgement and insight appear normal   Data Reviewed:  Notable labs --- Cr improved 4.46 >> 3.73,  BUN 30 , glucose 113, lipase 66 >> 57  Family Communication: daughter Maudie Mercury updated by phone  Disposition: Status is:  Inpatient Remains inpatient appropriate because: Persistent AKI remains on IV fluids.   Planned Discharge Destination: Home    Time spent: 40 minutes  Author: Ezekiel Slocumb, DO 05/25/2022 1:17 PM  For on call review [email or web address].

## 2022-05-25 NOTE — Assessment & Plan Note (Signed)
No evidence of exacerbation Continue SABA prn and symbicort

## 2022-05-25 NOTE — Assessment & Plan Note (Addendum)
Recently found on coronary CTA-12/11. Sub acute.  Started on xarelto; however, with her acute renal injury, treated during admission with heparin gtt. Renal function improved, okay to resume Xarelto at discharge.

## 2022-05-25 NOTE — Progress Notes (Signed)
ANTICOAGULATION CONSULT NOTE - Initial Consult  Pharmacy Consult for Heparin (PTA Xarelto on hold) Indication: pulmonary embolus - diagnosed 05/13/2022  No Known Allergies  Patient Measurements: Height: 5' (152.4 cm) Weight: 67.4 kg (148 lb 9.4 oz) IBW/kg (Calculated) : 45.5 Heparin Dosing Weight: 59.5 kg  Vital Signs: Temp: 97.8 F (36.6 C) (12/23 0651) Temp Source: Oral (12/23 0651) BP: 141/86 (12/23 0651) Pulse Rate: 61 (12/23 0651)  Labs: Recent Labs    05/24/22 1557 05/25/22 0133 05/25/22 1019  HGB 14.6 13.8  --   HCT 43.7 41.6  --   PLT 382 387  --   APTT  --  44* 73*  HEPARINUNFRC  --  >1.10*  --   CREATININE 4.46* 3.72*  --      Estimated Creatinine Clearance: 11 mL/min (A) (by C-G formula based on SCr of 3.72 mg/dL (H)).   Medical History: Past Medical History:  Diagnosis Date   Colitis    Depression    Hypertension    Seasonal allergies     Medications:  PTA Xarelto starter pack: currently still on 15mg  BID with last dose taken 12/22 @ 1100  Assessment: 76 yr female admitted with acute kidney injury, recent CDiff infection, c/o tarry black stool (hgb 14.6).  Recently diagnosed with acute PE and started on Xarelto dose pack on 05/13/2022. Due to AKI (SCr = 4.46), Xarelto has been held and pharmacy consulted to dose IV heparin gtt.  Plans to resume Xarelto once kidney function returns to normal.  Goal of Therapy:  Heparin level 0.3-0.7 units/ml aPTT 66-102 seconds Monitor platelets by anticoagulation protocol: Yes   Plan:  aPTT 73 - therapeutic Continue heparin infusion at 950 units/hr Check confirmatory aPTT at 1800 Will monitor using aPTT until effects of Xarelto have worn off and aPTT and heparin levels correlate Daily CBC and heparin level  14/04/2022, PharmD, BCPS Clinical Pharmacist 05/25/2022 12:07 PM

## 2022-05-25 NOTE — Assessment & Plan Note (Addendum)
Has chronic diarrhea for years, followed by GI, but acutely worse on 12/11.   Diagnosed with C.Diff on 12/19 and started on oral vancomycin  --Continue PO vanc --Enteric precautions

## 2022-05-25 NOTE — Assessment & Plan Note (Signed)
As above.

## 2022-05-25 NOTE — Assessment & Plan Note (Signed)
Continue buspar daily

## 2022-05-25 NOTE — Assessment & Plan Note (Addendum)
Cr on admission was 4.46 (baseline around 0.8).  Suspect pre-renal give her C diff infection and diarrhea, poor PO intake. Renal U/S negative. Cr trend: 4.46>>3.73>>2.08 --Continue IV fluids --Renally dose meds, avoid nephrotoxic agents and hypotension, IV contrast

## 2022-05-25 NOTE — Progress Notes (Signed)
ANTICOAGULATION CONSULT NOTE - Follow Up Consult  Pharmacy Consult for Heparin (PTA Xarelto on hold) Indication: pulmonary embolus - diagnosed 05/13/2022  No Known Allergies  Patient Measurements: Height: 5' (152.4 cm) Weight: 67.4 kg (148 lb 9.4 oz) IBW/kg (Calculated) : 45.5 Heparin Dosing Weight: 59.5 kg  Vital Signs: Temp: 97.8 F (36.6 C) (12/23 1927) Temp Source: Oral (12/23 1324) BP: 151/82 (12/23 1927) Pulse Rate: 66 (12/23 1927)  Labs: Recent Labs    05/24/22 1557 05/25/22 0133 05/25/22 1019 05/25/22 1934  HGB 14.6 13.8  --   --   HCT 43.7 41.6  --   --   PLT 382 387  --   --   APTT  --  44* 73* 68*  HEPARINUNFRC  --  >1.10*  --   --   CREATININE 4.46* 3.72*  --   --      Estimated Creatinine Clearance: 11 mL/min (A) (by C-G formula based on SCr of 3.72 mg/dL (H)).   Medical History: Past Medical History:  Diagnosis Date   Colitis    Depression    Hypertension    Seasonal allergies     Medications:  PTA Xarelto starter pack: currently still on 15mg  BID with last dose taken 12/22 @ 1100  Assessment: 76 yr female admitted with acute kidney injury, recent CDiff infection, c/o tarry black stool (hgb 14.6).  Recently diagnosed with acute PE and started on Xarelto dose pack on 05/13/2022. Due to AKI (SCr = 4.46), Xarelto has been held and pharmacy consulted to dose IV heparin gtt.  Plans to resume Xarelto once kidney function returns to normal.  aPTT 68 sec, therapeutic on heparin 950 units/hr No bleeding or interruptions in heparin infusion documented CBC: Hgb stable this AM; Plts WNL  Goal of Therapy:  Heparin level 0.3-0.7 units/ml aPTT 66-102 seconds Monitor platelets by anticoagulation protocol: Yes   Plan:  Continue heparin infusion at 950 units/hr - f/u aPTT in AM Will monitor using aPTT until effects of Xarelto have worn off and aPTT and heparin levels correlate Daily CBC and heparin level  14/04/2022, PharmD, BCPS Pharmacy:  [phone] 05/25/2022 8:17 PM

## 2022-05-25 NOTE — Assessment & Plan Note (Signed)
Well controlled.  Continue norvasc three times a week

## 2022-05-26 DIAGNOSIS — I2699 Other pulmonary embolism without acute cor pulmonale: Secondary | ICD-10-CM | POA: Diagnosis not present

## 2022-05-26 DIAGNOSIS — A0472 Enterocolitis due to Clostridium difficile, not specified as recurrent: Secondary | ICD-10-CM

## 2022-05-26 DIAGNOSIS — N179 Acute kidney failure, unspecified: Secondary | ICD-10-CM | POA: Diagnosis not present

## 2022-05-26 LAB — HEPARIN LEVEL (UNFRACTIONATED)
Heparin Unfractionated: 0.52 IU/mL (ref 0.30–0.70)
Heparin Unfractionated: 0.61 IU/mL (ref 0.30–0.70)
Heparin Unfractionated: 0.26 IU/mL — ABNORMAL LOW (ref 0.30–0.70)

## 2022-05-26 LAB — CBC
HCT: 39.4 % (ref 36.0–46.0)
Hemoglobin: 13.1 g/dL (ref 12.0–15.0)
MCH: 32.7 pg (ref 26.0–34.0)
MCHC: 33.2 g/dL (ref 30.0–36.0)
MCV: 98.3 fL (ref 80.0–100.0)
Platelets: 411 10*3/uL — ABNORMAL HIGH (ref 150–400)
RBC: 4.01 MIL/uL (ref 3.87–5.11)
RDW: 12.2 % (ref 11.5–15.5)
WBC: 6.3 10*3/uL (ref 4.0–10.5)
nRBC: 0 % (ref 0.0–0.2)

## 2022-05-26 LAB — APTT
aPTT: 55 seconds — ABNORMAL HIGH (ref 24–36)
aPTT: 85 seconds — ABNORMAL HIGH (ref 24–36)

## 2022-05-26 LAB — BASIC METABOLIC PANEL
Anion gap: 10 (ref 5–15)
BUN: 21 mg/dL (ref 8–23)
CO2: 19 mmol/L — ABNORMAL LOW (ref 22–32)
Calcium: 9.4 mg/dL (ref 8.9–10.3)
Chloride: 111 mmol/L (ref 98–111)
Creatinine, Ser: 2.08 mg/dL — ABNORMAL HIGH (ref 0.44–1.00)
GFR, Estimated: 24 mL/min — ABNORMAL LOW (ref >60)
Glucose, Bld: 105 mg/dL — ABNORMAL HIGH (ref 70–99)
Potassium: 3.9 mmol/L (ref 3.5–5.1)
Sodium: 140 mmol/L (ref 135–145)

## 2022-05-26 NOTE — Plan of Care (Signed)
  Problem: Health Behavior/Discharge Planning: Goal: Ability to manage health-related needs will improve Outcome: Progressing   Problem: Activity: Goal: Risk for activity intolerance will decrease Outcome: Progressing   Problem: Nutrition: Goal: Adequate nutrition will be maintained Outcome: Progressing   

## 2022-05-26 NOTE — Progress Notes (Signed)
ANTICOAGULATION CONSULT NOTE - Follow Up Consult  Pharmacy Consult for Heparin (PTA Xarelto on hold) Indication: pulmonary embolus - diagnosed 05/13/2022  No Known Allergies  Patient Measurements: Height: 5' (152.4 cm) Weight: 67.4 kg (148 lb 9.4 oz) IBW/kg (Calculated) : 45.5 Heparin Dosing Weight: 59.5 kg  Vital Signs: Temp: 97.8 F (36.6 C) (12/24 2010) Temp Source: Oral (12/24 2010) BP: 149/77 (12/24 2010) Pulse Rate: 71 (12/24 2010)  Labs: Recent Labs    05/24/22 1557 05/24/22 1557 05/25/22 0133 05/25/22 1019 05/25/22 1934 05/26/22 0355 05/26/22 1343 05/26/22 2140  HGB 14.6  --  13.8  --   --  13.1  --   --   HCT 43.7  --  41.6  --   --  39.4  --   --   PLT 382  --  387  --   --  411*  --   --   APTT  --   --  44*   < > 68* 55* 85*  --   HEPARINUNFRC  --    < > >1.10*  --   --  0.52 0.61 0.26*  CREATININE 4.46*  --  3.72*  --   --  2.08*  --   --    < > = values in this interval not displayed.     Estimated Creatinine Clearance: 19.7 mL/min (A) (by C-G formula based on SCr of 2.08 mg/dL (H)).   Medical History: Past Medical History:  Diagnosis Date   Colitis    Depression    Hypertension    Seasonal allergies     Medications:  PTA Xarelto starter pack: currently still on 15mg  BID with last dose taken 12/22 @ 1100  Assessment: 76 yr female admitted with acute kidney injury, recent CDiff infection, c/o tarry black stool (hgb 14.6).  Recently diagnosed with acute PE and started on Xarelto dose pack on 05/13/2022. Due to AKI (SCr = 4.46), Xarelto has been held and pharmacy consulted to dose IV heparin gtt.  Plans to resume Xarelto once kidney function returns to normal.  05/26/22 Heparin level 0.26 (now subtherapeutic) this evening with heparin gtt @ 1050 units/hr   Hgb 13.1, Pltc wnl No bleeding or interruptions in heparin infusion noted by RN Scr continues to improve    Goal of Therapy:  Heparin level 0.3-0.7 units/ml aPTT 66-102  seconds Monitor platelets by anticoagulation protocol: Yes   Plan:  Increase heparin infusion to 1150 units/hr  Recheck heparin level in 8 hr after heparin rate increase Daily CBC and heparin level   05/28/22, PharmD 05/26/2022 10:42 PM

## 2022-05-26 NOTE — Plan of Care (Signed)
  Problem: Activity: Goal: Risk for activity intolerance will decrease Outcome: Progressing   Problem: Pain Managment: Goal: General experience of comfort will improve Outcome: Progressing   Problem: Safety: Goal: Ability to remain free from injury will improve Outcome: Progressing   

## 2022-05-26 NOTE — Progress Notes (Signed)
ANTICOAGULATION CONSULT NOTE - Follow Up Consult  Pharmacy Consult for Heparin (PTA Xarelto on hold) Indication: pulmonary embolus - diagnosed 05/13/2022  No Known Allergies  Patient Measurements: Height: 5' (152.4 cm) Weight: 67.4 kg (148 lb 9.4 oz) IBW/kg (Calculated) : 45.5 Heparin Dosing Weight: 59.5 kg  Vital Signs: Temp: 97.8 F (36.6 C) (12/23 1927) BP: 151/82 (12/23 1927) Pulse Rate: 66 (12/23 1927)  Labs: Recent Labs    05/24/22 1557 05/24/22 1557 05/25/22 0133 05/25/22 1019 05/25/22 1934 05/26/22 0355  HGB 14.6  --  13.8  --   --  13.1  HCT 43.7  --  41.6  --   --  39.4  PLT 382  --  387  --   --  411*  APTT  --    < > 44* 73* 68* 55*  HEPARINUNFRC  --   --  >1.10*  --   --  0.52  CREATININE 4.46*  --  3.72*  --   --  2.08*   < > = values in this interval not displayed.     Estimated Creatinine Clearance: 19.7 mL/min (A) (by C-G formula based on SCr of 2.08 mg/dL (H)).   Medical History: Past Medical History:  Diagnosis Date   Colitis    Depression    Hypertension    Seasonal allergies     Medications:  PTA Xarelto starter pack: currently still on 15mg  BID with last dose taken 12/22 @ 1100  Assessment: 76 yr female admitted with acute kidney injury, recent CDiff infection, c/o tarry black stool (hgb 14.6).  Recently diagnosed with acute PE and started on Xarelto dose pack on 05/13/2022. Due to AKI (SCr = 4.46), Xarelto has been held and pharmacy consulted to dose IV heparin gtt.  Plans to resume Xarelto once kidney function returns to normal.  05/26/22 aPTT = 55 sec (subtherapeutic) with heparin gtt @ 950 units/hr Heparin level this AM = 0.52 (down from > 1.1 in ~ 24 hours). Last Xarelto dose was 12/22 Hgb 13.1, Pltc wnl No bleeding or interruptions in heparin infusion documented Scr down to 2.08 (4.46 > 3.72)    Goal of Therapy:  Heparin level 0.3-0.7 units/ml aPTT 66-102 seconds Monitor platelets by anticoagulation protocol: Yes    Plan:  Due to quick decrease in HL over ~ 24 hours will adjust heparin rate off aPTT at this time. Increase heparin infusion to 1050 units/hr  Recheck aPTT and HL 8 hr after rate increase Will monitor using aPTT until effects of Xarelto have worn off and aPTT and heparin levels correlate Daily CBC and heparin level  1/23, PharmD 05/26/2022 5:34 AM

## 2022-05-26 NOTE — Progress Notes (Signed)
Progress Note   Patient: Ana Johnston XYB:[id number] DOB: 1945-08-17 DOA: 05/24/2022     1 DOS: the patient was seen and examined on 05/26/2022   Brief hospital course:  Ana Johnston is a 76 y.o. female with medical history significant of HTN,  depression and anxiety, recent diagnosis of subacute PE on coronary CTA, COPD, GERD who presented 05/24/2022 with complaints of weakness, nausea and not feeling well. Her stool is getting better, not as runny but still dark and gel like in consistency. No fevers, has had some chills. Drinking fine, but had poor PO intake. Diarrhea had been watery since 12/11 and was going every few hours to seconds up until she was put on oral vancomycin for C diff (diagnosed 05/21/22, started on oral vancomycin 4x/day for 10 days).   ER Course:  stable vitals.   Pertinent labs: BUN; 35, creatinine: 4.46, lipase 66,  CT abdomen/pelvis: patchy ground-glass opacities within the lung bases bilaterally. New from previous exam. (Patient not having any respiratory symptoms and reported recently treated with Zithromax outpatient for respiratory illness, now improved).  Diverticulosis without diverticulitis.  In ED: given 1L IVF bolus.   Admitted to the hospital and started on IV fluids for severe AKI.  Renal ultrasound was normal.  Renal function improving.  Urine output adequate.  Continuing oral vancomycin for C diff infection.   12/24: diarrhea less frequency, consistency of stools beginning to improve.  Cr 2.08.  Continuing fluids.    Assessment and Plan: * Acute kidney injury (HCC) Cr on admission was 4.46 (baseline around 0.8).  Suspect pre-renal give her C diff infection and diarrhea, poor PO intake. Renal U/S negative. --Continue IV fluids --Renally dose meds, avoid nephrotoxic agents and hypotension, IV contrast  Clostridium difficile diarrhea Has chronic diarrhea for years, followed by GI, but acutely worse on 12/11.   Diagnosed with C.Diff on 12/19 and  started on oral vancomycin  --Continue PO vanc --Enteric precautions  Acute pulmonary embolism without acute cor pulmonale (HCC) Recently found on coronary CTA-12/11. Sub acute.  Started on xarelto; however, with her acute renal injury will change to heparin gtt for now  As renal function improves can continue her xarelto starter pack.   Essential hypertension Well controlled.  Continue norvasc three times a week   COPD (chronic obstructive pulmonary disease) (HCC) No evidence of exacerbation Continue SABA prn and symbicort   Generalized anxiety disorder Continue buspar daily   AKI (acute kidney injury) (HCC) As above        Subjective: Pt reports starting to feel better.  She had 2 or 3 watery stools yesterday.  One BM this AM she reports starting to become more formed.  No abdominal pain, N/V or fever/chills.      Physical Exam: Vitals:   05/25/22 1324 05/25/22 1927 05/26/22 0542 05/26/22 0829  BP: 131/73 (!) 151/82 (!) 146/80   Pulse: 62 66 62   Resp: 18 18 16    Temp: 97.8 F (36.6 C) 97.8 F (36.6 C) (!) 97.5 F (36.4 C)   TempSrc: Oral  Oral   SpO2: 95% 96% 96% 96%  Weight:      Height:       General exam: awake, alert, no acute distress HEENT: atraumatic, clear conjunctiva, anicteric sclera, moist mucus membranes, hearing grossly normal  Respiratory system: CTAB, no wheezes, rales or rhonchi, normal respiratory effort. Cardiovascular system: normal S1/S2, RRR, no JVD, murmurs, rubs, gallops, no pedal edema.   Gastrointestinal system: soft, NT, ND, no HSM felt, +  bowel sounds. Central nervous system: A&O x3. no gross focal neurologic deficits, normal speech Extremities: moves all, no edema, normal tone Skin: dry, intact, normal temperature, normal color, No rashes, lesions or ulcers Psychiatry: normal mood, congruent affect, judgement and insight appear normal   Data Reviewed:  Notable labs --- Cr improved 4.46 >> 3.73 >> 2.08,  Bicarb 19 from 22, BUN  normalized 21 from 30.   CBC normal except platelets mildly up 411k.  Family Communication: daughter Selena Batten updated by phone 12/23  Disposition: Status is: Inpatient Remains inpatient appropriate because: Persistent AKI remains on IV fluids.   Planned Discharge Destination: Home    Time spent: 35 minutes  Author: Pennie Banter, DO 05/26/2022 12:53 PM  For on call review [email or web address].

## 2022-05-26 NOTE — Progress Notes (Addendum)
ANTICOAGULATION CONSULT NOTE - Follow Up Consult  Pharmacy Consult for Heparin (PTA Xarelto on hold) Indication: pulmonary embolus - diagnosed 05/13/2022  No Known Allergies  Patient Measurements: Height: 5' (152.4 cm) Weight: 67.4 kg (148 lb 9.4 oz) IBW/kg (Calculated) : 45.5 Heparin Dosing Weight: 59.5 kg  Vital Signs: Temp: 97.7 F (36.5 C) (12/24 1312) Temp Source: Oral (12/24 1312) BP: 146/73 (12/24 1312) Pulse Rate: 59 (12/24 1312)  Labs: Recent Labs    05/24/22 1557 05/25/22 0133 05/25/22 1019 05/25/22 1934 05/26/22 0355 05/26/22 1343  HGB 14.6 13.8  --   --  13.1  --   HCT 43.7 41.6  --   --  39.4  --   PLT 382 387  --   --  411*  --   APTT  --  44*   < > 68* 55* 85*  HEPARINUNFRC  --  >1.10*  --   --  0.52  --   CREATININE 4.46* 3.72*  --   --  2.08*  --    < > = values in this interval not displayed.     Estimated Creatinine Clearance: 19.7 mL/min (A) (by C-G formula based on SCr of 2.08 mg/dL (H)).   Medical History: Past Medical History:  Diagnosis Date   Colitis    Depression    Hypertension    Seasonal allergies     Medications:  PTA Xarelto starter pack: currently still on 15mg  BID with last dose taken 12/22 @ 1100  Assessment: 76 yr female admitted with acute kidney injury, recent CDiff infection, c/o tarry black stool (hgb 14.6).  Recently diagnosed with acute PE and started on Xarelto dose pack on 05/13/2022. Due to AKI (SCr = 4.46), Xarelto has been held and pharmacy consulted to dose IV heparin gtt.  Plans to resume Xarelto once kidney function returns to normal.  05/26/22 aPTT 85 sec - therapeutic with heparin infusion at 1050 units/hr Heparin level 0.52 this morning (down from > 1.1 in ~ 24 hours). Last Xarelto dose was 12/22 Hgb 13.1, Pltc wnl No bleeding or interruptions in heparin infusion documented Scr continues to improve    Goal of Therapy:  Heparin level 0.3-0.7 units/ml aPTT 66-102 seconds Monitor platelets by  anticoagulation protocol: Yes   Plan:  Continue heparin infusion at 1050 units/hr  Recheck aPTT ~ 8 hr to confirm Will monitor using aPTT until effects of Xarelto have worn off and aPTT and heparin levels correlate Daily CBC and heparin level  Addendum 1630: Appears HL and aPTT are correlating. Will defer further aPTT and continue with monitoring by HL. Next level 2200.  1/23, PharmD, BCPS Clinical Pharmacist 05/26/2022 2:17 PM

## 2022-05-27 DIAGNOSIS — N179 Acute kidney failure, unspecified: Secondary | ICD-10-CM | POA: Diagnosis not present

## 2022-05-27 LAB — BASIC METABOLIC PANEL
Anion gap: 7 (ref 5–15)
BUN: 16 mg/dL (ref 8–23)
CO2: 23 mmol/L (ref 22–32)
Calcium: 9.2 mg/dL (ref 8.9–10.3)
Chloride: 112 mmol/L — ABNORMAL HIGH (ref 98–111)
Creatinine, Ser: 1.59 mg/dL — ABNORMAL HIGH (ref 0.44–1.00)
GFR, Estimated: 33 mL/min — ABNORMAL LOW (ref >60)
Glucose, Bld: 110 mg/dL — ABNORMAL HIGH (ref 70–99)
Potassium: 3.9 mmol/L (ref 3.5–5.1)
Sodium: 142 mmol/L (ref 135–145)

## 2022-05-27 LAB — CBC
HCT: 38.8 % (ref 36.0–46.0)
Hemoglobin: 12.8 g/dL (ref 12.0–15.0)
MCH: 32.9 pg (ref 26.0–34.0)
MCHC: 33 g/dL (ref 30.0–36.0)
MCV: 99.7 fL (ref 80.0–100.0)
Platelets: 409 10*3/uL — ABNORMAL HIGH (ref 150–400)
RBC: 3.89 MIL/uL (ref 3.87–5.11)
RDW: 12.4 % (ref 11.5–15.5)
WBC: 6.8 10*3/uL (ref 4.0–10.5)
nRBC: 0 % (ref 0.0–0.2)

## 2022-05-27 LAB — HEPARIN LEVEL (UNFRACTIONATED): Heparin Unfractionated: 0.54 IU/mL (ref 0.30–0.70)

## 2022-05-27 MED ORDER — MELOXICAM 15 MG PO TABS: 15.0000 mg | ORAL_TABLET | Freq: Every day | ORAL | Status: DC | PRN

## 2022-05-27 MED ORDER — RIVAROXABAN 15 MG PO TABS
15.0000 mg | ORAL_TABLET | Freq: Once | ORAL | Status: AC
  Administered 2022-05-27: 15 mg via ORAL
  Filled 2022-05-27: qty 1

## 2022-05-27 MED ORDER — CLOTRIMAZOLE 1 % VA CREA: 1.0000 | TOPICAL_CREAM | Freq: Every day | VAGINAL | 0 refills | Status: DC

## 2022-05-27 NOTE — Discharge Summary (Signed)
Physician Discharge Summary   Patient: Ana Johnston MRN: [id number] DOB: 1946-03-01  Admit date:     05/24/2022  Discharge date: 05/28/22  Discharge Physician: Pennie Banter   PCP: [facility], [place]   Recommendations at discharge:    Follow up with Primary Care in 1-2 weeks Repeat CBC, BMP, Mg in 1-2 weeks Follow up on resolution of C diff  Discharge Diagnoses: Principal Problem:   Acute kidney injury (HCC) Active Problems:   Clostridium difficile diarrhea   Acute pulmonary embolism without acute cor pulmonale (HCC)   Essential hypertension   COPD (chronic obstructive pulmonary disease) (HCC)   Generalized anxiety disorder   AKI (acute kidney injury) (HCC)  Resolved Problems:   * No resolved hospital problems. *  Hospital Course: Ana Johnston is a 76 y.o. female with medical history significant of HTN,  depression and anxiety, recent diagnosis of subacute PE on coronary CTA, COPD, GERD who presented 05/24/2022 with complaints of weakness, nausea and not feeling well. Her stool is getting better, not as runny but still dark and gel like in consistency. No fevers, has had some chills. Drinking fine, but had poor PO intake. Diarrhea had been watery since 12/11 and was going every few hours to seconds up until she was put on oral vancomycin for C diff (diagnosed 05/21/22, started on oral vancomycin 4x/day for 10 days).   ER Course:  stable vitals.   Pertinent labs: BUN; 35, creatinine: 4.46, lipase 66,  CT abdomen/pelvis: patchy ground-glass opacities within the lung bases bilaterally. New from previous exam. (Patient not having any respiratory symptoms and reported recently treated with Zithromax outpatient for respiratory illness, now improved).  Diverticulosis without diverticulitis.  In ED: given 1L IVF bolus.    Admitted to the hospital and started on IV fluids for severe AKI.   Renal ultrasound was normal.  Renal function improving.  Urine  output adequate.   Continuing oral vancomycin for C diff infection.    12/24: diarrhea less frequency, consistency of stools beginning to improve.  Cr 2.08.  Continuing fluids.  12/25: Cr further improved 1.59.  Diarrhea now resolving and stools more formed.  Medically stable for discharge home today.   Assessment and Plan: * Acute kidney injury (HCC) Cr on admission was 4.46 (baseline around 0.8).  Suspect pre-renal give her C diff infection and diarrhea, poor PO intake. Renal U/S negative. 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   Diagnosed with C.Diff on 12/19 and started on oral vancomycin  --Continue PO vanc --Enteric precautions  Acute pulmonary embolism without acute cor pulmonale (HCC) Recently found on coronary CTA-12/11. Sub acute.  Started on xarelto; however, with her acute renal injury, treated during admission with heparin gtt. Renal function improved, okay to resume Xarelto at discharge.  Essential hypertension Well controlled.  Continue norvasc three times a week   COPD (chronic obstructive pulmonary disease) (HCC) No evidence of exacerbation Continue SABA prn and symbicort   Generalized anxiety disorder Continue buspar daily   AKI (acute kidney injury) (HCC) As above         Consultants: None Procedures performed: none  Disposition: Home Diet recommendation:  Discharge Diet Orders (From admission, onward)     Start     Ordered   05/27/22 0000  Diet - low sodium heart healthy  05/27/22 0856           Regular diet DISCHARGE MEDICATION: Allergies as of 05/27/2022   No Known Allergies      Medication List     TAKE these medications    albuterol 108 (90 Base) MCG/ACT inhaler Commonly known as: VENTOLIN HFA Inhale 2 puffs into the lungs every 6 (six) hours as needed for  wheezing or shortness of breath.   amLODipine 5 MG tablet Commonly known as: NORVASC Take 5 mg by mouth 3 (three) times a week.   atorvastatin 20 MG tablet Commonly known as: LIPITOR Take 1 tablet (20 mg total) by mouth daily.   budesonide-formoterol 160-4.5 MCG/ACT inhaler Commonly known as: SYMBICORT Inhale 2 puffs into the lungs daily as needed (sob and wheezing).   busPIRone 5 MG tablet Commonly known as: BUSPAR Take 5 mg by mouth daily.   calcium-vitamin D 500-200 MG-UNIT tablet Commonly known as: OSCAL WITH D Take 1 tablet by mouth daily with breakfast.   clotrimazole 1 % vaginal cream Commonly known as: GYNE-LOTRIMIN Place 1 Applicatorful vaginally at bedtime.   co-enzyme Q-10 30 MG capsule Take 30 mg by mouth daily.   magnesium 30 MG tablet Take 30 mg by mouth daily.   Melatonin 10 MG Tabs Take 10 mg by mouth at bedtime.   meloxicam 15 MG tablet Commonly known as: MOBIC Take 1 tablet (15 mg total) by mouth daily as needed for pain. Hold off until repeat labs with Primary Care in 1-2 weeks What changed: additional instructions   metoprolol tartrate 100 MG tablet Commonly known as: LOPRESSOR Take 1 tablet (100 mg total) by mouth once for 1 dose. Take 2 hours prior to procedure.   montelukast 10 MG tablet Commonly known as: SINGULAIR Take 10 mg by mouth daily.   potassium chloride SA 20 MEQ tablet Commonly known as: KLOR-CON M Take 20 mEq by mouth daily.   potassium chloride SA 20 MEQ tablet Commonly known as: KLOR-CON M Take 1 tablet (20 mEq total) by mouth daily for 2 days.   Rivaroxaban Stater Pack (15 mg and 20 mg) Commonly known as: XARELTO STARTER PACK Follow package directions: Take one 15mg  tablet by mouth twice a day. On day 22, switch to one 20mg  tablet once a day. Take with food. What changed:  how much to take how to take this when to take this   rivaroxaban 20 MG Tabs tablet Commonly known as: XARELTO Take 1 tablet (20 mg total) by  mouth daily with supper. What changed: Another medication with the same name was changed. Make sure you understand how and when to take each.   vancomycin 125 MG capsule Commonly known as: VANCOCIN Take 125 mg by mouth 4 (four) times daily.   VITAMIN B-12 PO Take 3 drops by mouth 3 (three) times a week.   Vitamin D3 25 MCG (1000 UT) Chew Chew 1 tablet by mouth daily.        Follow-up Information     [facility], [facility]. Call.   Why: Hospital follow up for repeat labs in 1-2 weeks Contact information: [address] [place] [facility] [place] [facility] [phone]                Discharge Exam: Filed Weights   05/25/22 0054  Weight: 67.4 kg   General exam: awake, alert, no acute distress HEENT: atraumatic, clear conjunctiva, anicteric sclera, moist mucus membranes, hearing grossly normal  Respiratory system: CTAB no wheezes,  rales or rhonchi, normal respiratory effort. Cardiovascular system: normal S1/S2, RRR, no JVD, murmurs, rubs, gallops,  no pedal edema.   Gastrointestinal system: soft, NT, ND, no HSM felt, +bowel sounds. Central nervous system: A&O x4. no gross focal neurologic deficits, normal speech Extremities: moves all, no edema, normal tone Skin: dry, intact, normal temperature, normal color,No rashes, lesions or ulcers Psychiatry: normal mood, congruent affect, judgement and insight appear normal   Condition at discharge: stable  The results of significant diagnostics from this hospitalization (including imaging, microbiology, ancillary and laboratory) are listed below for reference.   Imaging Studies: US RENAL  Result Date: 05/25/2022 CLINICAL DATA:  Acute kidney injury. EXAM: RENAL / URINARY TRACT ULTRASOUND COMPLETE COMPARISON:  CT AP 05/24/2022 FINDINGS: Right Kidney: Renal measurements: 11.8 x 4.9 x 4.6 cm = volume: 139.9 mL. Echogenicity within normal limits. No mass or hydronephrosis visualized. Left Kidney: Renal measurements:  11.9 x 6.0 x 5.7 cm = volume: 211.1 mL. Echogenicity within normal limits. No mass or hydronephrosis visualized. Bladder: Appears normal for degree of bladder distention. Other: None. IMPRESSION: Normal renal sonogram. Electronically Signed   By: Signa Kell M.D.   On: 05/25/2022 10:27   CT ABDOMEN PELVIS WO CONTRAST  Result Date: 05/24/2022 CLINICAL DATA:  Acute renal failure with abdominal pain, initial encounter EXAM: CT ABDOMEN AND PELVIS WITHOUT CONTRAST TECHNIQUE: Multidetector CT imaging of the abdomen and pelvis was performed following the standard protocol without IV contrast. RADIATION DOSE REDUCTION: This exam was performed according to the departmental dose-optimization program which includes automated exposure control, adjustment of the mA and/or kV according to patient size and/or use of iterative reconstruction technique. COMPARISON:  05/13/2022 FINDINGS: Lower chest: New patchy ground-glass opacities are noted in the lower lung fields bilaterally Hepatobiliary: Fatty infiltration of the liver is noted. Gallbladder has been surgically removed. Pancreas: Unremarkable. No pancreatic ductal dilatation or surrounding inflammatory changes. Spleen: Normal in size without focal abnormality. Adrenals/Urinary Tract: Adrenal glands are within normal limits. Kidneys are well visualize without evidence of obstruction. No renal calculi or obstructive changes are seen. The bladder is decompressed. Stomach/Bowel: Mild diverticular change of the colon is noted. Fluid is noted throughout the colon without obstructive change. The appendix is within normal limits. Stomach and small bowel are unremarkable. Vascular/Lymphatic: Aortic atherosclerosis. No enlarged abdominal or pelvic lymph nodes. Reproductive: Status post hysterectomy. No adnexal masses. Other: No abdominal wall hernia or abnormality. No abdominopelvic ascites. Musculoskeletal: Changes of prior vertebral augmentation are noted in the lower thoracic  spine. Mild degenerative changes are noted. IMPRESSION: Diverticulosis without diverticulitis. Patchy ground-glass opacities within the lung bases bilaterally. These are new from the most recent exam and likely represent multifocal inflammatory change. Electronically Signed   By: Alcide Clever M.D.   On: 05/24/2022 20:19   VAS [place] LOWER EXTREMITY VENOUS (DVT)  Result Date: 05/15/2022  Lower Venous DVT Study Patient Name:  CADIE SORCI  Date of Exam:   05/15/2022 Medical Rec #: [id number]     Accession #:    [id number] Date of Birth: 1946-05-04     Patient Gender: F Patient Age:   46 years Exam Location:  [facility] Procedure:      VAS [place] LOWER EXTREMITY VENOUS (DVT) Referring Phys: Christiane Ha BERRY --------------------------------------------------------------------------------  Other Indications: Patient was found to have a filling defect in her pulmonary                    artery vasculature consistent with subacute pulmonary  embolism. She was started on Xarelto. She has burning pain in                    her left inner thigh but otherwise no significant lower                    extremty symptoms. Anticoagulation: Xarelto. Performing Technologist: Olegario Shearer RVT  Examination Guidelines: A complete evaluation includes B-mode imaging, spectral Doppler, color Doppler, and power Doppler as needed of all accessible portions of each vessel. Bilateral testing is considered an integral part of a complete examination. Limited examinations for reoccurring indications may be performed as noted. The reflux portion of the exam is performed with the patient in reverse Trendelenburg.  +---------+---------------+---------+-----------+----------+--------------+ RIGHT    CompressibilityPhasicitySpontaneityPropertiesThrombus Aging +---------+---------------+---------+-----------+----------+--------------+ CFV      Full           Yes      Yes                                  +---------+---------------+---------+-----------+----------+--------------+ SFJ      Full           Yes      Yes                                 +---------+---------------+---------+-----------+----------+--------------+ FV Prox  Full           Yes      Yes                                 +---------+---------------+---------+-----------+----------+--------------+ FV Mid   Full           Yes      Yes                                 +---------+---------------+---------+-----------+----------+--------------+ FV DistalFull           Yes      Yes                                 +---------+---------------+---------+-----------+----------+--------------+ PFV      Full                                                        +---------+---------------+---------+-----------+----------+--------------+ POP      Full           Yes      Yes                                 +---------+---------------+---------+-----------+----------+--------------+ PTV      Full           Yes      Yes                                 +---------+---------------+---------+-----------+----------+--------------+ PERO     Full  Yes      Yes                                 +---------+---------------+---------+-----------+----------+--------------+ Gastroc  Full                                                        +---------+---------------+---------+-----------+----------+--------------+ GSV      Full           Yes      Yes                                 +---------+---------------+---------+-----------+----------+--------------+   +---------+---------------+---------+-----------+----------+--------------+ LEFT     CompressibilityPhasicitySpontaneityPropertiesThrombus Aging +---------+---------------+---------+-----------+----------+--------------+ CFV      Full           Yes      Yes                                  +---------+---------------+---------+-----------+----------+--------------+ SFJ      Full           Yes      Yes                                 +---------+---------------+---------+-----------+----------+--------------+ FV Prox  Full           Yes      Yes                                 +---------+---------------+---------+-----------+----------+--------------+ FV Mid   Full           Yes      Yes                                 +---------+---------------+---------+-----------+----------+--------------+ FV DistalFull           Yes      Yes                                 +---------+---------------+---------+-----------+----------+--------------+ PFV      Full                                                        +---------+---------------+---------+-----------+----------+--------------+ POP      Full           Yes      Yes                                 +---------+---------------+---------+-----------+----------+--------------+ PTV      Full           Yes      Yes                                 +---------+---------------+---------+-----------+----------+--------------+  PERO     Full           Yes      Yes                                 +---------+---------------+---------+-----------+----------+--------------+ Gastroc  Full                                                        +---------+---------------+---------+-----------+----------+--------------+ GSV      Full           Yes      Yes                                 +---------+---------------+---------+-----------+----------+--------------+     Summary: RIGHT: - No evidence of deep vein thrombosis in the lower extremity. No indirect evidence of obstruction proximal to the inguinal ligament. - No cystic structure found in the popliteal fossa.  LEFT: - No evidence of deep vein thrombosis in the lower extremity. No indirect evidence of obstruction proximal to the inguinal ligament. - No  cystic structure found in the popliteal fossa.  *See table(s) above for measurements and observations. Electronically signed by Joshua Robins on 12/13Gerarda Fraction:16:24 PM.    Final    CT Angio Chest Pulmonary Embolism (PE) W or WO Contrast  Result Date: 05/13/2022 CLINICAL DATA:  Abnormal cardiac CTA, former smoker, quit 4 years ago. EXAM: CT ANGIOGRAPHY CHEST WITH CONTRAST TECHNIQUE: Multidetector CT imaging of the chest was performed using the standard protocol during bolus administration of intravenous contrast. Multiplanar CT image reconstructions and MIPs were obtained to evaluate the vascular anatomy. RADIATION DOSE REDUCTION: This exam was performed according to the departmental dose-optimization program which includes automated exposure control, adjustment of the mA and/or kV according to patient size and/or use of iterative reconstruction technique. CONTRAST:  60mL OMNIPAQUE IOHEXOL 350 MG/ML SOLN COMPARISON:  05/10/2022 and 04/18/2022. FINDINGS: Cardiovascular: Small peripheral subsegmental filling defect in the right lower lobe (5/174-178). No additional filling defects in the pulmonary arteries. Atherosclerotic calcification of the aorta and coronary arteries. Heart is at the upper limits of normal in size. No pericardial effusion. Mediastinum/Nodes: No pathologically enlarged mediastinal, hilar or axillary lymph nodes. Esophagus is grossly unremarkable. Lungs/Pleura: No suspicious pulmonary nodules. No pleural fluid. Airway is unremarkable. Upper Abdomen: Liver is heterogeneous and likely enlarged although incompletely imaged. Left hepatic lobe margin is irregular. Visualized portions of the adrenal glands, kidneys, spleen, pancreas, stomach and bowel are otherwise unremarkable. No upper abdominal adenopathy. Musculoskeletal: T12 vertebral body augmentation. Degenerative changes in the spine. Old right rib fracture. Review of the MIP images confirms the above findings. IMPRESSION: 1. Small  subsegmental right lower lobe pulmonary embolus, likely subacute in age given its relatively peripheral location within the vessel. Critical Value/emergent results were called by telephone at the time of interpretation on 05/13/2022 at 11:45 am to provider [facility] , who verbally acknowledged these results. 2. Hepatic steatosis. 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 3. Aortic atherosclerosis (ICD10-I70.0). Coronary artery calcification. Electronically Signed   By: Leanna Battles M.D.   On: 05/13/2022 11:51   CT CORONARY MORPH W/CTA COR W/SCORE W/CA W/CM &/OR WO/CM  Addendum Date:  05/13/2022   ADDENDUM REPORT: 05/13/2022 09:17 ADDENDUM: These results were called by telephone at the time of interpretation on 05/13/2022 at 9:17 am to provider Dr. Jens Som, Who verbally acknowledged these results. Electronically Signed   By: Donzetta Kohut M.D.   On: 05/13/2022 09:17   Addendum Date: 05/13/2022   ADDENDUM REPORT: 05/13/2022 08:54 ADDENDUM: OVER-READ INTERPRETATION  CT CHEST The following report is an over-read performed by radiologist Dr. Lovie Chol [facility] on 05/13/2022. This over-read does not include interpretation of cardiac or coronary anatomy or pathology. The coronary calcium and coronary CT angiography interpretation by the cardiologist is attached. Imaging of the chest is focused on cardiac structures and excludes much of the chest on CT. COMPARISON: Coronary artery calcium scoring from April 18, 2022 FINDINGS: Cardiovascular: Potential RIGHT lower lobe segmental pulmonary embolism (image 30/10). Please see dedicated report for additional cardiovascular details. Mediastinum/Nodes: No adenopathy or acute process in the mediastinum. Lungs/Pleura: Lungs are clear and airways are patent to the extent evaluated. Upper Abdomen: Marked hepatic steatosis.  No acute findings. Musculoskeletal: No acute bone finding. No destructive bone process. Spinal  degenerative changes. IMPRESSION: 1. Potential RIGHT lower lobe segmental pulmonary embolism. Correlation with dedicated CT angiogram of the chest and or lower extremity venous Dopplers is suggested. 2. Marked hepatic steatosis. A call is out to the referring provider to further discuss findings in the above case. Electronically Signed   By: Donzetta Kohut M.D.   On: 05/13/2022 08:54   Result Date: 05/13/2022 EXAM: Cardiac/Coronary CTA TECHNIQUE: The patient was scanned on a [employer]. FINDINGS: A 100 kV prospective scan was triggered in the descending thoracic aorta at 111 HU's. Axial non-contrast 3 mm slices were carried out through the heart. The data set was analyzed on a dedicated work station and scored using the Agatson method. Gantry rotation speed was 250 msecs and collimation was .6 mm. No beta blockade and 0.8 mg of sl NTG was given. The 3D data set was reconstructed in 5% intervals of the 35-75% of the R-R cycle. Phases were analyzed on a dedicated work station using MPR, MIP and VRT modes. The patient received 100 cc of contrast. Coronary Arteries:  Normal coronary origin.  Right dominance. RCA is a large dominant artery that gives rise to PDA and PLA. There is no plaque. Left main is a large artery that gives rise to LAD and LCX arteries. LAD is a large vessel. Mixed plaque in proximal LAD causes 0-24% stenosis. High risk plaque features including napkin ring sign and positive remodeling. Calcified plaque in mid LAD causes 0-24% stenosis LCX is a non-dominant artery that gives rise to one large OM1 branch. Mixed plaque in proximal OM1 causes 25-49% stenosis Other findings: Left Ventricle: Normal size Left Atrium: Normal size Pulmonary Veins: Normal configuration Right Ventricle: Normal size Right Atrium: Normal size Cardiac valves: No calcifications Thoracic aorta: Normal size Pulmonary Arteries: Normal size Systemic Veins: Normal drainage Pericardium: Normal thickness IMPRESSION: 1.  Coronary calcium score of 335. This was 79th percentile for age and sex matched control. 2. Normal coronary origin with right dominance. 3. Nonobstructive CAD 4. Mixed plaque in proximal OM1 causes mild (25-49%) stenosis 5. Mixed plaque in proximal LAD causes minimal (0-24%) stenosis. There are high risk plaque features including napkin ring sign and positive remodeling. Calcified plaque in mid LAD causes minimal (0-24%) stenosis CAD-RADS 2. Mild non-obstructive CAD (25-49%). Consider non-atherosclerotic causes of chest pain. Consider preventive therapy and risk factor modification. Electronically Signed: By:  Epifanio Lesches M.D. On: 05/10/2022 17:37   ECHOCARDIOGRAM COMPLETE  Result Date: 05/01/2022    ECHOCARDIOGRAM REPORT   Patient Name:   ETOSHA WETHERELL Date of Exam: 05/01/2022 Medical Rec #:  [id number]    Height:       60.0 in Accession #:    [id number]   Weight:       154.6 lb Date of Birth:  Aug 04, 1945    BSA:          1.673 m Patient Age:    76 years     BP:           110/70 mmHg Patient Gender: F            HR:           81 bpm. Exam Location:  Outpatient Procedure: 3D Echo, 2D Echo, Cardiac Doppler, Color Doppler and Strain Analysis Indications:    R06.9 DOE  History:        Patient has no prior history of Echocardiogram examinations.                 COPD, Signs/Symptoms:Chest Pain and Dyspnea; Risk                 Factors:Hypertension and Current Smoker.  Sonographer:    Jeryl Columbia RDCS Referring Phys: [phone] JONATHAN J BERRY IMPRESSIONS  1. Left ventricular ejection fraction, by estimation, is 50 to 55%. The left ventricle has low normal function. The left ventricle has no regional wall motion abnormalities. Left ventricular diastolic parameters are consistent with Grade I diastolic dysfunction (impaired relaxation). The average left ventricular global longitudinal strain is -15.3 %. The global longitudinal strain is abnormal.  2. Right ventricular systolic function is normal. The right  ventricular size is normal.  3. The mitral valve is normal in structure. Trivial mitral valve regurgitation. No evidence of mitral stenosis.  4. The aortic valve is tricuspid. Aortic valve regurgitation is not visualized. Aortic valve sclerosis is present, with no evidence of aortic valve stenosis.  5. The inferior vena cava is normal in size with greater than 50% respiratory variability, suggesting right atrial pressure of 3 mmHg. Comparison(s): No prior Echocardiogram. FINDINGS  Left Ventricle: Left ventricular ejection fraction, by estimation, is 50 to 55%. The left ventricle has low normal function. The left ventricle has no regional wall motion abnormalities. The average left ventricular global longitudinal strain is -15.3 %. The global longitudinal strain is abnormal. The left ventricular internal cavity size was normal in size. There is no left ventricular hypertrophy. Left ventricular diastolic parameters are consistent with Grade I diastolic dysfunction (impaired relaxation). Right Ventricle: The right ventricular size is normal. Right ventricular systolic function is normal. Left Atrium: Left atrial size was normal in size. Right Atrium: Right atrial size was normal in size. Pericardium: There is no evidence of pericardial effusion. Mitral Valve: The mitral valve is normal in structure. Trivial mitral valve regurgitation. No evidence of mitral valve stenosis. Tricuspid Valve: The tricuspid valve is normal in structure. Tricuspid valve regurgitation is trivial. No evidence of tricuspid stenosis. Aortic Valve: The aortic valve is tricuspid. Aortic valve regurgitation is not visualized. Aortic valve sclerosis is present, with no evidence of aortic valve stenosis. Pulmonic Valve: The pulmonic valve was normal in structure. Pulmonic valve regurgitation is not visualized. No evidence of pulmonic stenosis. Aorta: The aortic root is normal in size and structure. Venous: The inferior vena cava is normal in size  with greater than 50% respiratory variability, suggesting  right atrial pressure of 3 mmHg. IAS/Shunts: No atrial level shunt detected by color flow Doppler.  LEFT VENTRICLE PLAX 2D LVIDd:         4.36 cm   Diastology LVIDs:         3.50 cm   LV e' medial:    5.00 cm/s LV PW:         1.36 cm   LV E/e' medial:  12.8 LV IVS:        1.30 cm   LV e' lateral:   7.72 cm/s LVOT diam:     2.00 cm   LV E/e' lateral: 8.3 LV SV:         49 LV SV Index:   29        2D Longitudinal Strain LVOT Area:     3.14 cm  2D Strain GLS Avg:     -15.3 %  RIGHT VENTRICLE RV Basal diam:  2.59 cm RV Mid diam:    2.41 cm RV S prime:     10.40 cm/s TAPSE (M-mode): 2.2 cm LEFT ATRIUM             Index        RIGHT ATRIUM          Index LA diam:        3.20 cm 1.91 cm/m   RA Area:     7.82 cm LA Vol (A2C):   30.6 ml 18.29 ml/m  RA Volume:   15.20 ml 9.08 ml/m LA Vol (A4C):   33.8 ml 20.20 ml/m LA Biplane Vol: 32.2 ml 19.25 ml/m  AORTIC VALVE LVOT Vmax:   74.40 cm/s LVOT Vmean:  46.600 cm/s LVOT VTI:    0.155 m  AORTA Ao Root diam: 3.40 cm Ao Asc diam:  3.20 cm MITRAL VALVE MV Area (PHT): 3.99 cm     SHUNTS MV Decel Time: 190 msec     Systemic VTI:  0.16 m MV E velocity: 63.90 cm/s   Systemic Diam: 2.00 cm MV A velocity: 102.00 cm/s MV E/A ratio:  0.63 Olga Millers MD Electronically signed by Olga Millers MD Signature Date/Time: 05/01/2022/1:58:19 PM    Final     Microbiology: Results for orders placed or performed during the hospital encounter of 12/12/21  Resp Panel by RT-PCR (Flu A&B, Covid) Anterior Nasal Swab     Status: None   Collection Time: 12/12/21  1:43 PM   Specimen: Anterior Nasal Swab  Result Value Ref Range Status   SARS Coronavirus 2 by RT PCR NEGATIVE NEGATIVE Final    Comment: (NOTE) SARS-CoV-2 target nucleic acids are NOT DETECTED.  The SARS-CoV-2 RNA is generally detectable in upper respiratory specimens during the acute phase of infection. The lowest concentration of SARS-CoV-2 viral copies this assay  can detect is 138 copies/mL. A negative result does not preclude SARS-Cov-2 infection and should not be used as the sole basis for treatment or other patient management decisions. A negative result may occur with  improper specimen collection/handling, submission of specimen other than nasopharyngeal swab, presence of viral mutation(s) within the areas targeted by this assay, and inadequate number of viral copies(<138 copies/mL). A negative result must be combined with clinical observations, patient history, and epidemiological information. The expected result is Negative.  Fact Sheet for Patients:  BloggerCourse.com  Fact Sheet for Healthcare Providers:  SeriousBroker.it  This test is no t yet approved or cleared by the [place] FDA and  has been authorized for detection and/or diagnosis  of SARS-CoV-2 by FDA under an Emergency Use Authorization (EUA). This EUA will remain  in effect (meaning this test can be used) for the duration of the COVID-19 declaration under Section 564(b)(1) of the Act, 21 U.S.C.section 360bbb-3(b)(1), unless the authorization is terminated  or revoked sooner.       Influenza A by PCR NEGATIVE NEGATIVE Final   Influenza B by PCR NEGATIVE NEGATIVE Final    Comment: (NOTE) The Xpert Xpress SARS-CoV-2/FLU/RSV plus assay is intended as an aid in the diagnosis of influenza from Nasopharyngeal swab specimens and should not be used as a sole basis for treatment. Nasal washings and aspirates are unacceptable for Xpert Xpress SARS-CoV-2/FLU/RSV testing.  Fact Sheet for Patients: BloggerCourse.com  Fact Sheet for Healthcare Providers: SeriousBroker.it  This test is not yet approved or cleared by the [place] FDA and has been authorized for detection and/or diagnosis of SARS-CoV-2 by FDA under an Emergency Use Authorization (EUA). This EUA will  remain in effect (meaning this test can be used) for the duration of the COVID-19 declaration under Section 564(b)(1) of the Act, 21 U.S.C. section 360bbb-3(b)(1), unless the authorization is terminated or revoked.  Performed at [facility], [address] [place], [place] [address]     Labs: CBC: Recent Labs  Lab 05/24/22 1557 05/25/22 0133 05/26/22 0355 05/27/22 0411  WBC 8.7 8.1 6.3 6.8  HGB 14.6 13.8 13.1 12.8  HCT 43.7 41.6 39.4 38.8  MCV 97.8 98.3 98.3 99.7  PLT 382 387 411* 409*   Basic Metabolic Panel: Recent Labs  Lab 05/24/22 1557 05/25/22 0133 05/26/22 0355 05/27/22 0411  NA 135 137 140 142  K 4.5 4.2 3.9 3.9  CL 103 106 111 112*  CO2 21* 22 19* 23  GLUCOSE 113* 113* 105* 110*  BUN 35* 30* 21 16  CREATININE 4.46* 3.72* 2.08* 1.59*  CALCIUM 9.3 9.4 9.4 9.2   Liver Function Tests: Recent Labs  Lab 05/24/22 1557  AST 21  ALT 17  ALKPHOS 80  BILITOT 0.6  PROT 7.4  ALBUMIN 3.5   CBG: No results for input(s): "GLUCAP" in the last 168 hours.  Discharge time spent: less than 30 minutes.  Signed: Pennie Banter, DO [facility] 05/28/2022

## 2022-05-27 NOTE — Progress Notes (Signed)
ANTICOAGULATION CONSULT NOTE - Follow Up Consult  Pharmacy Consult for Heparin (PTA Xarelto on hold) Indication: pulmonary embolus - diagnosed 05/13/2022  No Known Allergies  Patient Measurements: Height: 5' (152.4 cm) Weight: 67.4 kg (148 lb 9.4 oz) IBW/kg (Calculated) : 45.5 Heparin Dosing Weight: 59.5 kg  Vital Signs: Temp: 97.7 F (36.5 C) (12/25 0546) Temp Source: Oral (12/25 0546) BP: 155/86 (12/25 0546) Pulse Rate: 57 (12/25 0546)  Labs: Recent Labs    05/25/22 0133 05/25/22 1019 05/25/22 1934 05/26/22 0355 05/26/22 1343 05/26/22 2140 05/27/22 0411 05/27/22 0853  HGB 13.8  --   --  13.1  --   --  12.8  --   HCT 41.6  --   --  39.4  --   --  38.8  --   PLT 387  --   --  411*  --   --  409*  --   APTT 44*   < > 68* 55* 85*  --   --   --   HEPARINUNFRC >1.10*  --   --  0.52 0.61 0.26*  --  0.54  CREATININE 3.72*  --   --  2.08*  --   --  1.59*  --    < > = values in this interval not displayed.     Estimated Creatinine Clearance: 25.8 mL/min (A) (by C-G formula based on SCr of 1.59 mg/dL (H)).   Medical History: Past Medical History:  Diagnosis Date   Colitis    Depression    Hypertension    Seasonal allergies     Medications:  PTA Xarelto starter pack: currently still on 15mg  BID with last dose taken 12/22 @ 1100  Assessment: 76 yr female admitted with acute kidney injury, recent CDiff infection, c/o tarry black stool (hgb 14.6).  Recently diagnosed with acute PE and started on Xarelto dose pack on 05/13/2022. Due to AKI (SCr = 4.46), Xarelto has been held and pharmacy consulted to dose IV heparin gtt.  Plans to resume Xarelto once kidney function returns to normal.  05/27/22 Heparin level 0.54 - therapeutic with heparin infusion at 1150 units/hr  CBC stable Renal function continues to improve Patient to discharge today    Goal of Therapy:  Heparin level 0.3-0.7 units/ml aPTT 66-102 seconds Monitor platelets by anticoagulation protocol: Yes    Plan:  Resume PTA Xarelto (will give dose today prior to discharge) Stop heparin infusion with morning dose Patient will be informed to resume Xarelto starter pack from where she left off prior to coming into hospital - to start tonight   05/29/22, PharmD, BCPS Clinical Pharmacist 05/27/2022 10:29 AM

## 2022-05-27 NOTE — Progress Notes (Signed)
Notified pharmacy of patient's discharge planned for today. Pharmacy to review anticoagulation. Haydee Salter, RN 05/27/22 9:44 AM

## 2022-05-27 NOTE — Progress Notes (Signed)
  Transition of Care [facility]) Screening Note   Patient Details  Name: Ana Johnston Date of Birth: Oct 17, 1945   Transition of Care [facility]) CM/SW Contact:    Amada Jupiter, LCSW Phone Number: 05/27/2022, 8:40 AM    Transition of Care Department [facility]) has reviewed patient and no TOC needs have been identified at this time. We will continue to monitor patient advancement through interdisciplinary progression rounds. If new patient transition needs arise, please place a TOC consult.

## 2022-05-27 NOTE — Plan of Care (Signed)
Patient discharging home via private vehicle with son. Haydee Salter, RN 05/27/22 1:54 PM

## 2022-05-27 NOTE — Plan of Care (Signed)
Problem: Clinical Measurements: Goal: Ability to maintain clinical measurements within normal limits will improve Outcome: Progressing   Problem: Activity: Goal: Risk for activity intolerance will decrease Outcome: Progressing  Problem: Safety: Goal: Ability to remain free from injury will improve Outcome: Progressing   Haydee Salter, RN 05/27/22 9:51 AM

## 2022-06-06 DIAGNOSIS — M545 Low back pain, unspecified: Secondary | ICD-10-CM | POA: Diagnosis not present

## 2022-06-06 DIAGNOSIS — R197 Diarrhea, unspecified: Secondary | ICD-10-CM | POA: Diagnosis not present

## 2022-06-06 DIAGNOSIS — R790 Abnormal level of blood mineral: Secondary | ICD-10-CM | POA: Diagnosis not present

## 2022-06-06 DIAGNOSIS — N179 Acute kidney failure, unspecified: Secondary | ICD-10-CM | POA: Diagnosis not present

## 2022-06-12 DIAGNOSIS — Z8619 Personal history of other infectious and parasitic diseases: Secondary | ICD-10-CM | POA: Diagnosis not present

## 2022-06-12 DIAGNOSIS — R197 Diarrhea, unspecified: Secondary | ICD-10-CM | POA: Diagnosis not present

## 2022-06-14 DIAGNOSIS — N179 Acute kidney failure, unspecified: Secondary | ICD-10-CM | POA: Diagnosis not present

## 2022-06-14 DIAGNOSIS — E876 Hypokalemia: Secondary | ICD-10-CM | POA: Diagnosis not present

## 2022-08-21 ENCOUNTER — Encounter: Payer: Self-pay | Admitting: Cardiovascular Disease

## 2022-08-21 ENCOUNTER — Ambulatory Visit: Payer: Medicare Other | Attending: Cardiovascular Disease | Admitting: Cardiovascular Disease

## 2022-08-21 VITALS — BP 118/72 | HR 90 | Ht 60.0 in | Wt 142.0 lb

## 2022-08-21 DIAGNOSIS — R0609 Other forms of dyspnea: Secondary | ICD-10-CM | POA: Diagnosis not present

## 2022-08-21 DIAGNOSIS — Z8249 Family history of ischemic heart disease and other diseases of the circulatory system: Secondary | ICD-10-CM | POA: Diagnosis not present

## 2022-08-21 DIAGNOSIS — E785 Hyperlipidemia, unspecified: Secondary | ICD-10-CM

## 2022-08-21 DIAGNOSIS — R931 Abnormal findings on diagnostic imaging of heart and coronary circulation: Secondary | ICD-10-CM | POA: Diagnosis not present

## 2022-08-21 DIAGNOSIS — I10 Essential (primary) hypertension: Secondary | ICD-10-CM

## 2022-08-21 DIAGNOSIS — I2510 Atherosclerotic heart disease of native coronary artery without angina pectoris: Secondary | ICD-10-CM

## 2022-08-21 MED ORDER — RIVAROXABAN 20 MG PO TABS: 20.0000 mg | ORAL_TABLET | Freq: Every day | ORAL | 2 refills | Status: DC

## 2022-08-21 NOTE — Progress Notes (Signed)
[id number] Kailly Bickham   [id number]  [id number]  Primary [place] Primary Cardiologist: Lorretta Harp MD Garret Reddish, [place], [place]  HPI:  Ana Johnston is a 77 y.o.  moderately overweight widowed Caucasian female mother of 2 children, grandmother of 2 grandchildren who is accompanied by her daughter-in-law Merril Abbe today. She was referred by [facility] emergency room because recent visit due to shortness of breath. She apparently had an elevated D-dimer and CRP level.  I last saw her in the office 05/14/2022.  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 Risk factors include 40 pack years tobacco abuse having quit 4 years ago with COPD. She has treated hypertension as well as family history of heart disease the father who died of an MI in his 43s and a brother who had bypass surgery as well. She is never had a heart attack or stroke. She does complain of increasing shortness of breath over the last several months as well as atypical chest pain. She was recently seen at [facility] emergency room with shortness of breath. D-dimer was apparently elevated as was CRP. She also had hypokalemia and hypomagnesemia for unclear reasons.  I obtained a coronary CTA on her 05/13/2022 that revealed a coronary calcium score of 335 with nonobstructive CAD.  Interestingly, she did have a right lower lobe segmental pulmonary embolism as well.  I began her on a Xarelto starter pack and she has been on Xarelto since.  A 2D echocardiogram revealed normal LV and RV systolic.  Lower extremity venous Doppler studies showed no evidence of DVT.  Since I saw her 3 months ago she did stop the Xarelto on her own.  Her dyspnea on exertion has completely resolved.  She is able to walk now without limitation.  She denies chest pain.  I did begin her on atorvastatin based on her elevated coronary calcium score.   Current Meds  Medication Sig   albuterol  (VENTOLIN HFA) 108 (90 Base) MCG/ACT inhaler Inhale 2 puffs into the lungs every 6 (six) hours as needed for wheezing or shortness of breath.   amLODipine (NORVASC) 5 MG tablet Take 5 mg by mouth 3 (three) times a week.   atorvastatin (LIPITOR) 20 MG tablet Take 1 tablet (20 mg total) by mouth daily.   budesonide-formoterol (SYMBICORT) 160-4.5 MCG/ACT inhaler Inhale 2 puffs into the lungs daily as needed (sob and wheezing).   busPIRone (BUSPAR) 5 MG tablet Take 5 mg by mouth daily.   calcium-vitamin D (OSCAL WITH D) 500-200 MG-UNIT tablet Take 1 tablet by mouth daily with breakfast.   Cholecalciferol (VITAMIN D3) 1000 units CHEW Chew 1 tablet by mouth daily.   clotrimazole (GYNE-LOTRIMIN) 1 % vaginal cream Place 1 Applicatorful vaginally at bedtime.   co-enzyme Q-10 30 MG capsule Take 30 mg by mouth daily.   Cyanocobalamin (VITAMIN B-12 PO) Take 3 drops by mouth 3 (three) times a week.   magnesium 30 MG tablet Take 30 mg by mouth daily.   Melatonin 10 MG TABS Take 10 mg by mouth at bedtime.   montelukast (SINGULAIR) 10 MG tablet Take 10 mg by mouth daily.   potassium chloride SA (KLOR-CON M) 20 MEQ tablet Take 20 mEq by mouth daily.   [DISCONTINUED] meloxicam (MOBIC) 15 MG tablet Take 1 tablet (15 mg total) by mouth daily as needed for pain. Hold off until repeat labs with Primary Care in 1-2 weeks  No Known Allergies  Social History   Socioeconomic History   Marital status: Widowed    Spouse name: Not on file   Number of children: Not on file   Years of education: Not on file   Highest education level: Not on file  Occupational History   Occupation: retired  Tobacco Use   Smoking status: Every Day   Smokeless tobacco: Never   Tobacco comments:    1/2 ppd x 50+ years  Substance and Sexual Activity   Alcohol use: Yes    Comment: 2 glasses of wine a day   Drug use: No   Sexual activity: Not on file  Other Topics Concern   Not on file  Social History Narrative   Not on file    Social Determinants of Health   Financial Resource Strain: Not on file  Food Insecurity: No Food Insecurity (05/25/2022)   Hunger Vital Sign    Worried About Running Out of Food in the Last Year: Never true    Ran Out of Food in the Last Year: Never true  Transportation Needs: No Transportation Needs (05/25/2022)   PRAPARE - Hydrologist (Medical): No    Lack of Transportation (Non-Medical): No  Physical Activity: Not on file  Stress: Not on file  Social Connections: Not on file  Intimate Partner Violence: Not At Risk (05/25/2022)   Humiliation, Afraid, Rape, and Kick questionnaire    Fear of Current or Ex-Partner: No    Emotionally Abused: No    Physically Abused: No    Sexually Abused: No     Review of Systems: General: negative for chills, fever, night sweats or weight changes.  Cardiovascular: negative for chest pain, dyspnea on exertion, edema, orthopnea, palpitations, paroxysmal nocturnal dyspnea or shortness of breath Dermatological: negative for rash Respiratory: negative for cough or wheezing Urologic: negative for hematuria Abdominal: negative for nausea, vomiting, diarrhea, bright red blood per rectum, melena, or hematemesis Neurologic: negative for visual changes, syncope, or dizziness All other systems reviewed and are otherwise negative except as noted above.    Blood pressure 118/72, pulse 90, height 5' (1.524 m), weight 142 lb (64.4 kg).  General appearance: alert and no distress Neck: no adenopathy, no carotid bruit, no JVD, supple, symmetrical, trachea midline, and thyroid not enlarged, symmetric, no tenderness/mass/nodules Lungs: clear to auscultation bilaterally Heart: regular rate and rhythm, S1, S2 normal, no murmur, click, rub or gallop Extremities: extremities normal, atraumatic, no cyanosis or edema Pulses: 2+ and symmetric Skin: Skin color, texture, turgor normal. No rashes or lesions Neurologic: Grossly normal  EKG  not performed today  ASSESSMENT AND PLAN:   Essential hypertension History of essential hypertension blood pressure measured today at 118/72.  She is on amlodipine and metoprolol.  Dyspnea on exertion History of dyspnea on exertion probably multifactorial from long history tobacco abuse and COPD.  She did have a coronary CTA which I performed 05/13/2022 that showed a right lower lobe segmental pulmonary embolism.  Based on this I started her on a Xarelto starter pack.  Her shortness of breath markedly improved.  She did stop her Xarelto recently on her own however.  I prefer that she be on it for a total of 6 months and we will have her restart after 3 additional months.  Her lower extremity venous Doppler study showed no evidence of DVT.  Hyperlipidemia History of hyperlipidemia on atorvastatin with lipid profile 1 month ago revealing a total cholesterol of 187, LDL 108  and HDL 58.  Will recheck a lipid liver profile.  Coronary artery disease History of coronary CTA performed 05/10/2022 revealed a coronary calcium score of 335 with nonobstructive CAD.     Lorretta Harp MD FACP,FACC,FAHA, [facility] 08/21/2022 3:50 PM

## 2022-08-21 NOTE — Patient Instructions (Signed)
Medication Instructions:   -Restart rivaroxaban (xarelto) 20mg  once daily with supper.  *If you need a refill on your cardiac medications before your next appointment, please call your pharmacy*   Lab Work: Your physician recommends that you return for lab work in: the next week or 2 for FASTING lipid/liver panel.  If you have labs (blood work) drawn today and your tests are completely normal, you will receive your results only by: [place] (if you have MyChart) OR A paper copy in the mail If you have any lab test that is abnormal or we need to change your treatment, we will call you to review the results.    Follow-Up: At [facility], you and your health needs are our priority.  As part of our continuing mission to provide you with exceptional heart care, we have created designated Provider Care Teams.  These Care Teams include your primary Cardiologist (physician) and Advanced Practice Providers (APPs -  Physician Assistants and Nurse Practitioners) who all work together to provide you with the care you need, when you need it.  We recommend signing up for the patient portal called "MyChart".  Sign up information is provided on this After Visit Summary.  MyChart is used to connect with patients for Virtual Visits (Telemedicine).  Patients are able to view lab/test results, encounter notes, upcoming appointments, etc.  Non-urgent messages can be sent to your provider as well.   To learn more about what you can do with MyChart, go to [email or web address].    Your next appointment:   6 month(s)  Provider:   Quay Burow, MD

## 2022-08-21 NOTE — Assessment & Plan Note (Signed)
History of essential hypertension blood pressure measured today at 118/72.  She is on amlodipine and metoprolol.

## 2022-08-21 NOTE — Assessment & Plan Note (Signed)
History of hyperlipidemia on atorvastatin with lipid profile 1 month ago revealing a total cholesterol of 187, LDL 108 and HDL 58.  Will recheck a lipid liver profile.

## 2022-08-21 NOTE — Assessment & Plan Note (Signed)
History of dyspnea on exertion probably multifactorial from long history tobacco abuse and COPD.  She did have a coronary CTA which I performed 05/13/2022 that showed a right lower lobe segmental pulmonary embolism.  Based on this I started her on a Xarelto starter pack.  Her shortness of breath markedly improved.  She did stop her Xarelto recently on her own however.  I prefer that she be on it for a total of 6 months and we will have her restart after 3 additional months.  Her lower extremity venous Doppler study showed no evidence of DVT.

## 2022-08-21 NOTE — Assessment & Plan Note (Signed)
History of coronary CTA performed 05/10/2022 revealed a coronary calcium score of 335 with nonobstructive CAD.

## 2022-08-22 DIAGNOSIS — R931 Abnormal findings on diagnostic imaging of heart and coronary circulation: Secondary | ICD-10-CM | POA: Diagnosis not present

## 2022-08-22 DIAGNOSIS — K080 Exfoliation of teeth due to systemic causes: Secondary | ICD-10-CM | POA: Diagnosis not present

## 2022-08-22 DIAGNOSIS — E785 Hyperlipidemia, unspecified: Secondary | ICD-10-CM | POA: Diagnosis not present

## 2022-08-22 DIAGNOSIS — Z8249 Family history of ischemic heart disease and other diseases of the circulatory system: Secondary | ICD-10-CM | POA: Diagnosis not present

## 2022-08-23 LAB — HEPATIC FUNCTION PANEL
ALT: 21 IU/L (ref 0–32)
AST: 24 IU/L (ref 0–40)
Albumin: 4.2 g/dL (ref 3.8–4.8)
Alkaline Phosphatase: 101 IU/L (ref 44–121)
Bilirubin Total: 0.7 mg/dL (ref 0.0–1.2)
Bilirubin, Direct: 0.19 mg/dL (ref 0.00–0.40)
Total Protein: 6.9 g/dL (ref 6.0–8.5)

## 2022-08-23 LAB — LIPID PANEL
Chol/HDL Ratio: 2.4 ratio (ref 0.0–4.4)
Cholesterol, Total: 134 mg/dL (ref 100–199)
HDL: 57 mg/dL (ref >39)
LDL Chol Calc (NIH): 55 mg/dL (ref 0–99)
Triglycerides: 127 mg/dL (ref 0–149)
VLDL Cholesterol Cal: 22 mg/dL (ref 5–40)

## 2022-09-02 HISTORY — PX: COLONOSCOPY: SHX174

## 2022-09-16 DIAGNOSIS — R197 Diarrhea, unspecified: Secondary | ICD-10-CM | POA: Diagnosis not present

## 2022-09-16 DIAGNOSIS — R1030 Lower abdominal pain, unspecified: Secondary | ICD-10-CM | POA: Diagnosis not present

## 2022-09-17 DIAGNOSIS — R1030 Lower abdominal pain, unspecified: Secondary | ICD-10-CM | POA: Diagnosis not present

## 2022-09-17 DIAGNOSIS — R197 Diarrhea, unspecified: Secondary | ICD-10-CM | POA: Diagnosis not present

## 2022-09-19 DIAGNOSIS — R197 Diarrhea, unspecified: Secondary | ICD-10-CM | POA: Diagnosis not present

## 2022-09-24 DIAGNOSIS — R197 Diarrhea, unspecified: Secondary | ICD-10-CM | POA: Diagnosis not present

## 2022-09-24 DIAGNOSIS — K5730 Diverticulosis of large intestine without perforation or abscess without bleeding: Secondary | ICD-10-CM | POA: Diagnosis not present

## 2022-09-24 DIAGNOSIS — E7800 Pure hypercholesterolemia, unspecified: Secondary | ICD-10-CM | POA: Diagnosis not present

## 2022-09-24 DIAGNOSIS — K52832 Lymphocytic colitis: Secondary | ICD-10-CM | POA: Diagnosis not present

## 2022-09-25 ENCOUNTER — Telehealth: Payer: Self-pay | Admitting: Cardiovascular Disease

## 2022-09-25 NOTE — Telephone Encounter (Signed)
Pt c/o medication issue:  1. Name of Medication:  atorvastatin (LIPITOR) 20 MG tablet  2. How are you currently taking this medication (dosage and times per day)? Once daily by mouth at night  3. Are you having a reaction (difficulty breathing--STAT)?   4. What is your medication issue?  Patient states this medication causes severe leg pain that makes it difficult to sleep at night.

## 2022-09-25 NOTE — Telephone Encounter (Signed)
Attempted to call patient, left message for patient to call back to office.   

## 2022-09-27 NOTE — Telephone Encounter (Signed)
Patient daughter states leg pain for approx. for several weeks.  States unsure if all the time, but patient notices at night really bad. Patient on Atorvastatin since December with no issues until recent.  States Patient has had Bowel issues over the last few weeks due to diarrhea and was dehydrated She just had colonoscopy and waiting for results.  Advised that it sounds like the symptoms are more related to dehydration.  Advised moving forward, to ensure she is drinking enough fluids and that if this does not resolve then to call office and we will re-look at issues.

## 2022-10-03 DIAGNOSIS — K080 Exfoliation of teeth due to systemic causes: Secondary | ICD-10-CM | POA: Diagnosis not present

## 2022-10-08 ENCOUNTER — Ambulatory Visit: Payer: Medicare Other | Admitting: Cardiovascular Disease

## 2022-10-09 DIAGNOSIS — K52832 Lymphocytic colitis: Secondary | ICD-10-CM | POA: Diagnosis not present

## 2022-10-29 DIAGNOSIS — S2249XA Multiple fractures of ribs, unspecified side, initial encounter for closed fracture: Secondary | ICD-10-CM | POA: Insufficient documentation

## 2022-10-29 DIAGNOSIS — E876 Hypokalemia: Secondary | ICD-10-CM | POA: Diagnosis not present

## 2022-10-29 DIAGNOSIS — R54 Age-related physical debility: Secondary | ICD-10-CM | POA: Diagnosis not present

## 2022-10-29 DIAGNOSIS — W010XXA Fall on same level from slipping, tripping and stumbling without subsequent striking against object, initial encounter: Secondary | ICD-10-CM | POA: Diagnosis not present

## 2022-10-29 DIAGNOSIS — S2242XA Multiple fractures of ribs, left side, initial encounter for closed fracture: Secondary | ICD-10-CM | POA: Diagnosis not present

## 2022-10-29 DIAGNOSIS — R109 Unspecified abdominal pain: Secondary | ICD-10-CM | POA: Diagnosis not present

## 2022-10-29 DIAGNOSIS — G8911 Acute pain due to trauma: Secondary | ICD-10-CM | POA: Diagnosis not present

## 2022-10-29 DIAGNOSIS — Y92009 Unspecified place in unspecified non-institutional (private) residence as the place of occurrence of the external cause: Secondary | ICD-10-CM | POA: Diagnosis not present

## 2022-10-29 DIAGNOSIS — R0781 Pleurodynia: Secondary | ICD-10-CM | POA: Diagnosis not present

## 2022-10-29 DIAGNOSIS — S270XXA Traumatic pneumothorax, initial encounter: Secondary | ICD-10-CM | POA: Insufficient documentation

## 2022-10-29 DIAGNOSIS — I2782 Chronic pulmonary embolism: Secondary | ICD-10-CM | POA: Diagnosis not present

## 2022-10-29 DIAGNOSIS — S299XXA Unspecified injury of thorax, initial encounter: Secondary | ICD-10-CM | POA: Diagnosis not present

## 2022-10-29 DIAGNOSIS — I10 Essential (primary) hypertension: Secondary | ICD-10-CM | POA: Diagnosis not present

## 2022-10-29 DIAGNOSIS — E871 Hypo-osmolality and hyponatremia: Secondary | ICD-10-CM | POA: Diagnosis not present

## 2022-10-29 DIAGNOSIS — M542 Cervicalgia: Secondary | ICD-10-CM | POA: Diagnosis not present

## 2022-10-29 DIAGNOSIS — W19XXXA Unspecified fall, initial encounter: Secondary | ICD-10-CM | POA: Diagnosis not present

## 2022-10-29 DIAGNOSIS — S0990XA Unspecified injury of head, initial encounter: Secondary | ICD-10-CM | POA: Diagnosis not present

## 2022-10-29 DIAGNOSIS — S2239XA Fracture of one rib, unspecified side, initial encounter for closed fracture: Secondary | ICD-10-CM | POA: Diagnosis not present

## 2022-10-29 DIAGNOSIS — R519 Headache, unspecified: Secondary | ICD-10-CM | POA: Diagnosis not present

## 2022-10-29 DIAGNOSIS — Z87891 Personal history of nicotine dependence: Secondary | ICD-10-CM | POA: Diagnosis not present

## 2022-10-29 DIAGNOSIS — R0902 Hypoxemia: Secondary | ICD-10-CM | POA: Diagnosis not present

## 2022-10-29 DIAGNOSIS — F419 Anxiety disorder, unspecified: Secondary | ICD-10-CM | POA: Diagnosis not present

## 2022-10-29 DIAGNOSIS — S2241XA Multiple fractures of ribs, right side, initial encounter for closed fracture: Secondary | ICD-10-CM | POA: Insufficient documentation

## 2022-10-29 DIAGNOSIS — Z7901 Long term (current) use of anticoagulants: Secondary | ICD-10-CM | POA: Diagnosis not present

## 2022-10-29 DIAGNOSIS — R0789 Other chest pain: Secondary | ICD-10-CM | POA: Diagnosis not present

## 2022-10-29 DIAGNOSIS — D6869 Other thrombophilia: Secondary | ICD-10-CM | POA: Diagnosis not present

## 2022-10-29 HISTORY — DX: Multiple fractures of ribs, right side, initial encounter for closed fracture: S22.41XA

## 2022-11-19 DIAGNOSIS — S2242XD Multiple fractures of ribs, left side, subsequent encounter for fracture with routine healing: Secondary | ICD-10-CM | POA: Diagnosis not present

## 2022-11-19 DIAGNOSIS — R0789 Other chest pain: Secondary | ICD-10-CM | POA: Diagnosis not present

## 2022-11-19 DIAGNOSIS — S2232XA Fracture of one rib, left side, initial encounter for closed fracture: Secondary | ICD-10-CM | POA: Diagnosis not present

## 2022-11-19 DIAGNOSIS — S2242XA Multiple fractures of ribs, left side, initial encounter for closed fracture: Secondary | ICD-10-CM | POA: Diagnosis not present

## 2022-12-12 DIAGNOSIS — K080 Exfoliation of teeth due to systemic causes: Secondary | ICD-10-CM | POA: Diagnosis not present

## 2022-12-17 DIAGNOSIS — K219 Gastro-esophageal reflux disease without esophagitis: Secondary | ICD-10-CM | POA: Diagnosis not present

## 2022-12-17 DIAGNOSIS — K52832 Lymphocytic colitis: Secondary | ICD-10-CM | POA: Diagnosis not present

## 2023-01-24 DIAGNOSIS — J320 Chronic maxillary sinusitis: Secondary | ICD-10-CM | POA: Diagnosis not present

## 2023-01-24 DIAGNOSIS — J42 Unspecified chronic bronchitis: Secondary | ICD-10-CM | POA: Diagnosis not present

## 2023-01-24 DIAGNOSIS — F411 Generalized anxiety disorder: Secondary | ICD-10-CM | POA: Diagnosis not present

## 2023-01-24 DIAGNOSIS — F330 Major depressive disorder, recurrent, mild: Secondary | ICD-10-CM | POA: Diagnosis not present

## 2023-01-24 DIAGNOSIS — Z0000 Encounter for general adult medical examination without abnormal findings: Secondary | ICD-10-CM | POA: Diagnosis not present

## 2023-01-24 DIAGNOSIS — J3089 Other allergic rhinitis: Secondary | ICD-10-CM | POA: Diagnosis not present

## 2023-01-30 DIAGNOSIS — J329 Chronic sinusitis, unspecified: Secondary | ICD-10-CM | POA: Diagnosis not present

## 2023-01-30 DIAGNOSIS — I10 Essential (primary) hypertension: Secondary | ICD-10-CM | POA: Diagnosis not present

## 2023-01-30 DIAGNOSIS — J40 Bronchitis, not specified as acute or chronic: Secondary | ICD-10-CM | POA: Diagnosis not present

## 2023-02-18 ENCOUNTER — Ambulatory Visit: Payer: Medicare Other | Admitting: Cardiovascular Disease

## 2023-03-11 DIAGNOSIS — Z780 Asymptomatic menopausal state: Secondary | ICD-10-CM | POA: Diagnosis not present

## 2023-03-11 DIAGNOSIS — Z13820 Encounter for screening for osteoporosis: Secondary | ICD-10-CM | POA: Diagnosis not present

## 2023-03-11 DIAGNOSIS — M85852 Other specified disorders of bone density and structure, left thigh: Secondary | ICD-10-CM | POA: Diagnosis not present

## 2023-03-18 DIAGNOSIS — U071 COVID-19: Secondary | ICD-10-CM | POA: Diagnosis not present

## 2023-03-18 DIAGNOSIS — J020 Streptococcal pharyngitis: Secondary | ICD-10-CM | POA: Diagnosis not present

## 2023-03-18 DIAGNOSIS — R112 Nausea with vomiting, unspecified: Secondary | ICD-10-CM | POA: Diagnosis not present

## 2023-04-16 ENCOUNTER — Ambulatory Visit (HOSPITAL_COMMUNITY)
Admission: RE | Admit: 2023-04-16 | Discharge: 2023-04-16 | Disposition: A | Discharge status: Home / Self Care | Payer: Medicare Other | Source: Ambulatory Visit | Attending: Cardiovascular Disease | Admitting: Cardiovascular Disease

## 2023-04-16 DIAGNOSIS — I2510 Atherosclerotic heart disease of native coronary artery without angina pectoris: Secondary | ICD-10-CM | POA: Diagnosis not present

## 2023-04-16 DIAGNOSIS — I700 Atherosclerosis of aorta: Secondary | ICD-10-CM | POA: Diagnosis not present

## 2023-04-16 DIAGNOSIS — R9389 Abnormal findings on diagnostic imaging of other specified body structures: Secondary | ICD-10-CM | POA: Diagnosis not present

## 2023-04-16 DIAGNOSIS — S2242XA Multiple fractures of ribs, left side, initial encounter for closed fracture: Secondary | ICD-10-CM | POA: Diagnosis not present

## 2023-04-16 LAB — POCT I-STAT CREATININE: Creatinine, Ser: 0.8 mg/dL (ref 0.44–1.00)

## 2023-04-16 MED ORDER — IOPAMIDOL (ISOVUE-370) INJECTION 76%
75.0000 mL | Freq: Once | INTRAVENOUS | Status: AC | PRN
  Administered 2023-04-16: 75 mL via INTRAVENOUS

## 2023-04-24 ENCOUNTER — Telehealth: Payer: Self-pay | Admitting: Cardiovascular Disease

## 2023-04-24 NOTE — Telephone Encounter (Signed)
Called patient and relayed below results to patient. No questions at this time.    Runell Gess, MD 04/16/2023  5:01 PM EST  No evidence of pulm embolism

## 2023-04-24 NOTE — Telephone Encounter (Signed)
Patient is requesting call back in regards to results. Please advise.

## 2023-05-09 ENCOUNTER — Other Ambulatory Visit: Payer: Self-pay | Admitting: Cardiovascular Disease

## 2023-05-12 DIAGNOSIS — K219 Gastro-esophageal reflux disease without esophagitis: Secondary | ICD-10-CM | POA: Diagnosis not present

## 2023-05-12 DIAGNOSIS — R197 Diarrhea, unspecified: Secondary | ICD-10-CM | POA: Diagnosis not present

## 2023-05-12 DIAGNOSIS — K52832 Lymphocytic colitis: Secondary | ICD-10-CM | POA: Diagnosis not present

## 2023-05-12 DIAGNOSIS — Z860100 Personal history of colon polyps, unspecified: Secondary | ICD-10-CM | POA: Diagnosis not present

## 2023-05-13 ENCOUNTER — Ambulatory Visit: Payer: Medicare Other | Admitting: Family Medicine

## 2023-05-16 ENCOUNTER — Encounter: Payer: Self-pay | Admitting: Family Medicine

## 2023-05-16 ENCOUNTER — Ambulatory Visit (INDEPENDENT_AMBULATORY_CARE_PROVIDER_SITE_OTHER): Payer: Medicare Other | Admitting: Family Medicine

## 2023-05-16 VITALS — BP 138/74 | HR 63 | Temp 97.7°F | Ht 60.83 in | Wt 145.8 lb

## 2023-05-16 DIAGNOSIS — J439 Emphysema, unspecified: Secondary | ICD-10-CM | POA: Diagnosis not present

## 2023-05-16 DIAGNOSIS — M150 Primary generalized (osteo)arthritis: Secondary | ICD-10-CM

## 2023-05-16 DIAGNOSIS — E782 Mixed hyperlipidemia: Secondary | ICD-10-CM | POA: Diagnosis not present

## 2023-05-16 DIAGNOSIS — F411 Generalized anxiety disorder: Secondary | ICD-10-CM

## 2023-05-16 DIAGNOSIS — M4802 Spinal stenosis, cervical region: Secondary | ICD-10-CM | POA: Insufficient documentation

## 2023-05-16 DIAGNOSIS — I10 Essential (primary) hypertension: Secondary | ICD-10-CM

## 2023-05-16 DIAGNOSIS — I2510 Atherosclerotic heart disease of native coronary artery without angina pectoris: Secondary | ICD-10-CM

## 2023-05-16 DIAGNOSIS — Z8249 Family history of ischemic heart disease and other diseases of the circulatory system: Secondary | ICD-10-CM

## 2023-05-16 DIAGNOSIS — Z7689 Persons encountering health services in other specified circumstances: Secondary | ICD-10-CM

## 2023-05-16 MED ORDER — ATORVASTATIN CALCIUM 20 MG PO TABS: 20.0000 mg | ORAL_TABLET | Freq: Every day | ORAL | 3 refills | Status: DC

## 2023-05-16 MED ORDER — BUSPIRONE HCL 5 MG PO TABS: 5.0000 mg | ORAL_TABLET | Freq: Two times a day (BID) | ORAL | 1 refills | Status: DC

## 2023-05-16 MED ORDER — XYZAL ALLERGY 24HR 5 MG PO TABS: 5.0000 mg | ORAL_TABLET | Freq: Every evening | ORAL | 3 refills | Status: DC

## 2023-05-16 MED ORDER — HYDROCHLOROTHIAZIDE 12.5 MG PO CAPS: 12.5000 mg | ORAL_CAPSULE | ORAL | 1 refills | Status: DC | PRN

## 2023-05-16 MED ORDER — TRELEGY ELLIPTA 200-62.5-25 MCG/ACT IN AEPB: 1.0000 | INHALATION_SPRAY | Freq: Every day | RESPIRATORY_TRACT | 11 refills | Status: DC

## 2023-05-16 MED ORDER — SERTRALINE HCL 50 MG PO TABS: 50.0000 mg | ORAL_TABLET | Freq: Every day | ORAL | 1 refills | Status: DC

## 2023-05-16 MED ORDER — MELOXICAM 15 MG PO TABS: 15.0000 mg | ORAL_TABLET | Freq: Every day | ORAL | 1 refills | Status: DC

## 2023-05-16 MED ORDER — MONTELUKAST SODIUM 10 MG PO TABS: 10.0000 mg | ORAL_TABLET | Freq: Every day | ORAL | 1 refills | Status: DC

## 2023-05-16 MED ORDER — ALBUTEROL SULFATE HFA 108 (90 BASE) MCG/ACT IN AERS: 2.0000 | INHALATION_SPRAY | Freq: Four times a day (QID) | RESPIRATORY_TRACT | 11 refills | Status: DC | PRN

## 2023-05-16 MED ORDER — AMLODIPINE BESYLATE 5 MG PO TABS: 5.0000 mg | ORAL_TABLET | Freq: Every day | ORAL | 1 refills | Status: DC

## 2023-05-16 NOTE — Patient Instructions (Addendum)
Return in about 5 months (around 10/20/2023) for Routine chronic condition follow-up.        Great to see you today.  I have refilled the medication(s) we provide.   If labs were collected or images ordered, we will inform you of  results once we have received them and reviewed. We will contact you either by echart message, or telephone call.  Please give ample time to the testing facility, and our office to run,  receive and review results. Please do not call inquiring of results, even if you can see them in your chart. We will contact you as soon as we are able. If it has been over 1 week since the test was completed, and you have not yet heard from [place], then please call us.    - echart message- for normal results that have been seen by the patient already.   - telephone call: abnormal results or if patient has not viewed results in their echart.  If a referral to a specialist was entered for you, please call us in 2 weeks if you have not heard from the specialist office to schedule.

## 2023-05-16 NOTE — Progress Notes (Signed)
Patient ID: Ana Johnston, female  DOB: 1945-12-21, 77 y.o.   MRN: [id number] Patient Care Team    Relationship Specialty Notifications Start End  Natalia Leatherwood, DO PCP - General Family Medicine  04/14/23   Konrad Penta, MD Referring Physician Gastroenterology  05/13/23   Runell Gess, MD Consulting Physician Cardiology  05/16/23     Chief Complaint  Patient presents with   Establish Care    Labs done on Monday by Dr. Jason Fila   Medical Management of Chronic Issues    Med transfer;dexa completed last month    Subjective:  Ana Johnston is a 77 y.o.  female present for new patient establishment. All past medical history, surgical history, allergies, family history, immunizations, medications and social history were updated in the electronic medical record today. All recent labs, ED visits and hospitalizations within the last year were reviewed.  Hypertension/hyperlipidemia/CAD/family history of heart disease: Pt reports compliance with amlodipine 5 mg daily and HCTZ 12.5 mg as needed.  Patient denies chest pain, shortness of breath or lower extremity edema. Pt prescribed Lipitor 20 mg daily  Anxiety: Patient reports compliance with BuSpar 5 mg twice daily and Zoloft 50 mg daily.  She feels these medications work well for her.  COPD: Patient is not currently established with pulmonology.  She recently was started on Trelegy, in place of Symbicort, and although it is more costly for her she feels it works better for her.  She uses albuterol as needed.  She takes nightly Xyzal and Singulair.  Cervical spinal stenosis: Patient reports she has a history of spinal stenosis.  She has received injections from a physician in [place] in the past.  She currently is prescribed meloxicam 15 mg daily. MRI spina cervical 06/12/2017 IMPRESSION:  1. No acute fracture or evidence for ligamentous injury. No abnormal  cord signal.  2. Mild edema within and surrounding right C3-4 facet,  likely  degenerative.  3. Small effusions within the anterior C1-2 and bilateral atlanto  occipital joints without bone marrow edema, likely degenerative.  4. Cervical spondylosis greatest at the C5-6 level.  5. Multilevel mild foraminal stenosis with moderate right C3-4 and  moderate left C5-6 foraminal stenosis.  6. No significant canal stenosis.  Lymphocytic colitis: She is under the care of gastroenterology for this condition.  Dr. Jason Fila.     05/16/2023    8:26 AM  Depression screen PHQ 2/9  Decreased Interest 0  Down, Depressed, Hopeless 0  PHQ - 2 Score 0       No data to display                   05/16/2023    8:26 AM 07/04/2017    9:48 AM  Fall Risk   Falls in the past year? 1 Yes  Number falls in past yr: 0 2 or more  Injury with Fall? 1 Yes  Risk Factor Category   High Fall Risk  Risk for fall due to : History of fall(s)   Follow up Falls evaluation completed Falls evaluation completed     Immunization History  Administered Date(s) Administered   DT (Pediatric) 06/03/2004   Fluad Quad(high Dose 65+) 03/28/2021, 04/11/2022   Influenza Inj Mdck Quad Pf 04/01/2012   Influenza, High Dose Seasonal PF 02/26/2013, 03/31/2015, 04/03/2017   Influenza, Seasonal, Injecte, Preservative Fre 04/01/2012   Influenza-Unspecified 04/17/2023   Moderna Sars-Covid-2 Vaccination 10/14/2019, 11/11/2019   PNEUMOCOCCAL CONJUGATE-20 04/17/2023   Pneumococcal Conjugate-13 03/31/2015  Pneumococcal Polysaccharide-23 02/19/2005, 03/01/2011, 08/18/2017   Tdap 04/20/2015, 02/01/2017   Unspecified SARS-COV-2 Vaccination 04/17/2023   Zoster Recombinant(Shingrix) 12/27/2017, 04/14/2018   Zoster, Live 06/04/2007    No results found.  Past Medical History:  Diagnosis Date   Acute pulmonary embolism without acute cor pulmonale (HCC) 05/17/2022   Formatting of this note might be different from the original. CTA of the Chest (05/13/2022): Small subsegmental right lower lobe  pulmonary embolus, likely subacute in age given its relatively peripheral location within the vessel.     Arthritis    Atypical chest pain 04/09/2022   Atypical chest pain     Clostridium difficile diarrhea 05/25/2022   Colitis    Depression    Diverticulitis    Dyspnea on exertion 04/09/2022   Dyspnea on exertion     Hypertension    Seasonal allergies    No Known Allergies Past Surgical History:  Procedure Laterality Date   ABDOMINAL HYSTERECTOMY     BACK SURGERY  09/2016   CATARACT EXTRACTION, BILATERAL     CHOLECYSTECTOMY     1966   COLONOSCOPY  09/2022   TONSILLECTOMY AND ADENOIDECTOMY     1965   Family History  Problem Relation Age of Onset   Diabetes Mother    Heart attack Father    Hearing loss Sister    Breast cancer Sister    Hyperlipidemia Brother    Heart attack Brother    Hearing loss Brother    Hypertension Son    Hyperlipidemia Son    Alcohol abuse Son    Social History   Social History Narrative   Marital status/children/pets:  widowed, G2P2   Education/employment: 12th grade education, retired   Field seismologist:      -smoke alarm in the home:Yes     - wears seatbelt: Yes     - Feels safe in their relationships: Yes       Allergies as of 05/16/2023   No Known Allergies      Medication List        Accurate as of May 16, 2023  3:59 PM. If you have any questions, ask your nurse or doctor.          STOP taking these medications    budesonide-formoterol 160-4.5 MCG/ACT inhaler Commonly known as: SYMBICORT Stopped by: Felix Pacini   clotrimazole 1 % vaginal cream Commonly known as: GYNE-LOTRIMIN Stopped by: Felix Pacini   co-enzyme Q-10 30 MG capsule Stopped by: Felix Pacini   metoprolol tartrate 100 MG tablet Commonly known as: LOPRESSOR Stopped by: Felix Pacini   potassium chloride SA 20 MEQ tablet Commonly known as: KLOR-CON M Stopped by: Felix Pacini   rivaroxaban 20 MG Tabs tablet Commonly known as: XARELTO Stopped by:  Felix Pacini       TAKE these medications    albuterol 108 (90 Base) MCG/ACT inhaler Commonly known as: VENTOLIN HFA Inhale 2 puffs into the lungs every 6 (six) hours as needed for wheezing or shortness of breath.   amLODipine 5 MG tablet Commonly known as: NORVASC Take 1 tablet (5 mg total) by mouth daily. What changed: when to take this Changed by: Felix Pacini   atorvastatin 20 MG tablet Commonly known as: LIPITOR Take 1 tablet (20 mg total) by mouth daily.   budesonide 3 MG 24 hr capsule Commonly known as: ENTOCORT EC Take by mouth daily.   busPIRone 5 MG tablet Commonly known as: BUSPAR Take 1 tablet (5 mg total) by mouth 2 (two) times daily.  What changed: when to take this Changed by: Felix Pacini   calcium-vitamin D 500-200 MG-UNIT tablet Commonly known as: OSCAL WITH D Take 1 tablet by mouth daily with breakfast.   hydrochlorothiazide 12.5 MG capsule Commonly known as: MICROZIDE Take 1 capsule (12.5 mg total) by mouth as needed.   magnesium 30 MG tablet Take 30 mg by mouth daily.   Melatonin 10 MG Tabs Take 10 mg by mouth as needed.   meloxicam 15 MG tablet Commonly known as: MOBIC Take 1 tablet (15 mg total) by mouth daily.   montelukast 10 MG tablet Commonly known as: SINGULAIR Take 1 tablet (10 mg total) by mouth daily.   sertraline 50 MG tablet Commonly known as: ZOLOFT Take 1 tablet (50 mg total) by mouth daily. What changed:  how much to take when to take this Changed by: Felix Pacini   Trelegy Ellipta 200-62.5-25 MCG/ACT Aepb Generic drug: Fluticasone-Umeclidin-Vilant Inhale 1 Inhalation into the lungs daily. What changed:  how much to take when to take this Changed by: Felix Pacini   VITAMIN B-12 PO Take 1 capsule by mouth daily.   Vitamin D3 25 MCG (1000 UT) Chew Chew 1 tablet by mouth daily.   Xyzal Allergy 24HR 5 MG tablet Generic drug: levocetirizine Take 1 tablet (5 mg total) by mouth every evening. What changed:  how  much to take when to take this Changed by: Felix Pacini        All past medical history, surgical history, allergies, family history, immunizations andmedications were updated in the EMR today and reviewed under the history and medication portions of their EMR.    Recent Results (from the past 2160 hours)  I-STAT creatinine     Status: None   Collection Time: 04/16/23 11:23 AM  Result Value Ref Range   Creatinine, Ser 0.80 0.44 - 1.00 mg/dL     ROS 14 pt review of systems performed and negative (unless mentioned in an HPI)  Objective: BP 138/74   Pulse 63   Temp 97.7 F (36.5 C)   Ht 5' 0.83" (1.545 m)   Wt 145 lb 12.8 oz (66.1 kg)   SpO2 96%   BMI 27.71 kg/m  Physical Exam Vitals and nursing note reviewed.  Constitutional:      General: She is not in acute distress.    Appearance: Normal appearance. She is not ill-appearing, toxic-appearing or diaphoretic.  HENT:     Head: Normocephalic and atraumatic.  Eyes:     General: No scleral icterus.       Right eye: No discharge.        Left eye: No discharge.     Extraocular Movements: Extraocular movements intact.     Conjunctiva/sclera: Conjunctivae normal.     Pupils: Pupils are equal, round, and reactive to light.  Cardiovascular:     Rate and Rhythm: Normal rate and regular rhythm.     Heart sounds: No murmur heard. Pulmonary:     Effort: Pulmonary effort is normal. No respiratory distress.     Breath sounds: Normal breath sounds. No wheezing, rhonchi or rales.  Musculoskeletal:     Cervical back: Neck supple.     Right lower leg: No edema.     Left lower leg: No edema.  Skin:    General: Skin is warm.     Findings: No rash.  Neurological:     Mental Status: She is alert and oriented to person, place, and time. Mental status is at baseline.  Motor: No weakness.     Gait: Gait normal.  Psychiatric:        Mood and Affect: Mood normal.        Behavior: Behavior normal.        Thought Content: Thought  content normal.        Judgment: Judgment normal.      Assessment/plan: Merial Aftab is a 77 y.o. female present for establish care Hypertension/hyperlipidemia/CAD/family history of heart disease: Stable. Continue amlodipine 5 mg daily Continue HCTZ 12.5 mg as needed for edema.  Continue Lipitor 20 mg daily Patient reports labs are due October 2025, this is when her physicals are usually scheduled. Anxiety: Stable Continue BuSpar 5 mg twice daily  Continue Zoloft 50 mg daily.    COPD: Patient is not currently established with pulmonology.   Condition is stable. Continue Trelegy daily. Continue Singulair. Continue Xyzal. Continue albuterol as needed.  lumbar spinal stenosis: Patient reports she has a history of spinal stenosis.  She has received injections from a physician in [place] in the past.   Continue meloxicam 15 mg daily.  Lymphocytic colitis: She is under the care of gastroenterology for this condition.  Dr. Jason Fila.  Return in about 5 months (around 10/20/2023) for Routine chronic condition follow-up.  No orders of the defined types were placed in this encounter.  Meds ordered this encounter  Medications   amLODipine (NORVASC) 5 MG tablet    Sig: Take 1 tablet (5 mg total) by mouth daily.    Dispense:  90 tablet    Refill:  1   atorvastatin (LIPITOR) 20 MG tablet    Sig: Take 1 tablet (20 mg total) by mouth daily.    Dispense:  90 tablet    Refill:  3   meloxicam (MOBIC) 15 MG tablet    Sig: Take 1 tablet (15 mg total) by mouth daily.    Dispense:  90 tablet    Refill:  1   TRELEGY ELLIPTA 200-62.5-25 MCG/ACT AEPB    Sig: Inhale 1 Inhalation into the lungs daily.    Dispense:  60 each    Refill:  11   busPIRone (BUSPAR) 5 MG tablet    Sig: Take 1 tablet (5 mg total) by mouth 2 (two) times daily.    Dispense:  180 tablet    Refill:  1   hydrochlorothiazide (MICROZIDE) 12.5 MG capsule    Sig: Take 1 capsule (12.5 mg total) by mouth as needed.     Dispense:  90 capsule    Refill:  1   montelukast (SINGULAIR) 10 MG tablet    Sig: Take 1 tablet (10 mg total) by mouth daily.    Dispense:  90 tablet    Refill:  1   sertraline (ZOLOFT) 50 MG tablet    Sig: Take 1 tablet (50 mg total) by mouth daily.    Dispense:  90 tablet    Refill:  1   albuterol (VENTOLIN HFA) 108 (90 Base) MCG/ACT inhaler    Sig: Inhale 2 puffs into the lungs every 6 (six) hours as needed for wheezing or shortness of breath.    Dispense:  18 g    Refill:  11   XYZAL ALLERGY 24HR 5 MG tablet    Sig: Take 1 tablet (5 mg total) by mouth every evening.    Dispense:  90 tablet    Refill:  3   Referral Orders  No referral(s) requested today     Note is dictated utilizing  voice recognition software. Although note has been proof read prior to signing, occasional typographical errors still can be missed. If any questions arise, please do not hesitate to call for verification.  Electronically signed by: Felix Pacini, DO [facility] [place]

## 2023-05-21 ENCOUNTER — Ambulatory Visit: Payer: Medicare Other | Admitting: Cardiovascular Disease

## 2023-05-23 ENCOUNTER — Encounter: Payer: Self-pay | Admitting: Family Medicine

## 2023-05-23 ENCOUNTER — Ambulatory Visit: Payer: Medicare Other | Admitting: Family Medicine

## 2023-05-23 VITALS — BP 138/80 | HR 72 | Temp 98.2°F | Wt 145.2 lb

## 2023-05-23 DIAGNOSIS — M25552 Pain in left hip: Secondary | ICD-10-CM

## 2023-05-23 DIAGNOSIS — M5416 Radiculopathy, lumbar region: Secondary | ICD-10-CM

## 2023-05-23 MED ORDER — TIZANIDINE HCL 4 MG PO TABS: 2.0000 mg | ORAL_TABLET | Freq: Three times a day (TID) | ORAL | 0 refills | Status: DC | PRN

## 2023-05-23 MED ORDER — METHYLPREDNISOLONE ACETATE 80 MG/ML IJ SUSP
80.0000 mg | Freq: Once | INTRAMUSCULAR | Status: AC
  Administered 2023-05-23: 80 mg via INTRAMUSCULAR

## 2023-05-23 MED ORDER — PREDNISONE 20 MG PO TABS: ORAL_TABLET | ORAL | 0 refills | Status: DC

## 2023-05-23 NOTE — Progress Notes (Signed)
Ana Johnston , 09/04/1945, 77 y.o., female MRN: [id number] Patient Care Team    Relationship Specialty Notifications Start End  Natalia Leatherwood, DO PCP - General Family Medicine  04/14/23   Konrad Penta, MD Referring Physician Gastroenterology  05/13/23   Runell Gess, MD Consulting Physician Cardiology  05/16/23     Chief Complaint  Patient presents with   Back Pain    Back pain that radiates down her left leg that started last week.     Subjective: Ana Johnston is a 77 y.o. Pt presents for an OV with complaints of bilateral low back pain with radiation of pain down left lower extremity to her calf and left groin.  Patient reports she has had this occur in the past, long time ago, but not this bad..  Most recently, symptoms started last week.  She denies any injury or increase in activity that she can think of. Associated symptoms include difficulty getting in car, hurts to pick up leg. She states the pain has been so uncomfortable she cannot sleep at night. Pt has tried to ease her symptoms by taking meloxicam /tylenol and using biofreeze. Patient has a history of cervical spinal stenosis. She reports she would like to be referred to orthopedics Dr. Shon Baton      05/16/2023    8:26 AM  Depression screen PHQ 2/9  Decreased Interest 0  Down, Depressed, Hopeless 0  PHQ - 2 Score 0    No Known Allergies Social History   Social History Narrative   Marital status/children/pets:  widowed, G2P2   Education/employment: 12th grade education, retired   Field seismologist:      -smoke alarm in the home:Yes     - wears seatbelt: Yes     - Feels safe in their relationships: Yes      Past Medical History:  Diagnosis Date   Acute pulmonary embolism without acute cor pulmonale (HCC) 05/17/2022   Formatting of this note might be different from the original. CTA of the Chest (05/13/2022): Small subsegmental right lower lobe pulmonary embolus, likely subacute in age given its  relatively peripheral location within the vessel.     Arthritis    Atypical chest pain 04/09/2022   Atypical chest pain     Clostridium difficile diarrhea 05/25/2022   Colitis    Depression    Diverticulitis    Dyspnea on exertion 04/09/2022   Dyspnea on exertion     Hypertension    Seasonal allergies    Past Surgical History:  Procedure Laterality Date   ABDOMINAL HYSTERECTOMY     BACK SURGERY  09/2016   CATARACT EXTRACTION, BILATERAL     CHOLECYSTECTOMY     1966   COLONOSCOPY  09/2022   TONSILLECTOMY AND ADENOIDECTOMY     1965   Family History  Problem Relation Age of Onset   Diabetes Mother    Heart attack Father    Hearing loss Sister    Breast cancer Sister    Hyperlipidemia Brother    Heart attack Brother    Hearing loss Brother    Hypertension Son    Hyperlipidemia Son    Alcohol abuse Son    Allergies as of 05/23/2023   No Known Allergies      Medication List        Accurate as of May 23, 2023 11:10 AM. If you have any questions, ask your nurse or doctor.  albuterol 108 (90 Base) MCG/ACT inhaler Commonly known as: VENTOLIN HFA Inhale 2 puffs into the lungs every 6 (six) hours as needed for wheezing or shortness of breath.   amLODipine 5 MG tablet Commonly known as: NORVASC Take 1 tablet (5 mg total) by mouth daily.   atorvastatin 20 MG tablet Commonly known as: LIPITOR Take 1 tablet (20 mg total) by mouth daily.   budesonide 3 MG 24 hr capsule Commonly known as: ENTOCORT EC Take by mouth daily.   busPIRone 5 MG tablet Commonly known as: BUSPAR Take 1 tablet (5 mg total) by mouth 2 (two) times daily.   calcium-vitamin D 500-200 MG-UNIT tablet Commonly known as: OSCAL WITH D Take 1 tablet by mouth daily with breakfast.   hydrochlorothiazide 12.5 MG capsule Commonly known as: MICROZIDE Take 1 capsule (12.5 mg total) by mouth as needed.   magnesium 30 MG tablet Take 30 mg by mouth daily.   Melatonin 10 MG Tabs Take  10 mg by mouth as needed.   meloxicam 15 MG tablet Commonly known as: MOBIC Take 1 tablet (15 mg total) by mouth daily.   montelukast 10 MG tablet Commonly known as: SINGULAIR Take 1 tablet (10 mg total) by mouth daily.   sertraline 50 MG tablet Commonly known as: ZOLOFT Take 1 tablet (50 mg total) by mouth daily.   Trelegy Ellipta 200-62.5-25 MCG/ACT Aepb Generic drug: Fluticasone-Umeclidin-Vilant Inhale 1 Inhalation into the lungs daily.   VITAMIN B-12 PO Take 1 capsule by mouth daily.   Vitamin D3 25 MCG (1000 UT) Chew Chew 1 tablet by mouth daily.   Xyzal Allergy 24HR 5 MG tablet Generic drug: levocetirizine Take 1 tablet (5 mg total) by mouth every evening.        All past medical history, surgical history, allergies, family history, immunizations andmedications were updated in the EMR today and reviewed under the history and medication portions of their EMR.     ROS Negative, with the exception of above mentioned in HPI   Objective:  BP 138/80   Pulse 72   Temp 98.2 F (36.8 C) (Oral)   Wt 145 lb 3.2 oz (65.9 kg)   SpO2 95%   BMI 27.59 kg/m  Body mass index is 27.59 kg/m. Physical Exam Vitals and nursing note reviewed.  Constitutional:      General: She is not in acute distress.    Appearance: Normal appearance. She is normal weight. She is not ill-appearing or toxic-appearing.  HENT:     Head: Normocephalic and atraumatic.  Eyes:     General: No scleral icterus.       Right eye: No discharge.        Left eye: No discharge.     Extraocular Movements: Extraocular movements intact.     Conjunctiva/sclera: Conjunctivae normal.     Pupils: Pupils are equal, round, and reactive to light.  Musculoskeletal:     Lumbar back: Spasms present. No swelling, tenderness or bony tenderness. Normal range of motion. Positive left straight leg raise test. Negative right straight leg raise test.     Comments: Lumbar/sacral: No bony tenderness of lumbar spine.   Bilateral SI joint tenderness to palpation.  Discomfort with bilateral sidebending.  Discomfort with extension.  Positive SLR left.  Positive LEFT FABRE for anterior hip pain.  Neurovascularly intact distally.  Skin:    Findings: No rash.  Neurological:     Mental Status: She is alert and oriented to person, place, and time. Mental status is at  baseline.     Motor: No weakness.     Coordination: Coordination normal.     Gait: Gait normal.  Psychiatric:        Mood and Affect: Mood normal.        Behavior: Behavior normal.        Thought Content: Thought content normal.        Judgment: Judgment normal.      No results found. No results found. No results found for this or any previous visit (from the past 24 hours).  Assessment/Plan: Ana Johnston is a 77 y.o. female present for OV for  Lumbar back pain with radiculopathy affecting left lower extremity (Primary) Patient has a history of cervical stenosis with a back surgery in 2018.  Now with lower lumbar discomfort, SI discomfort and radiation of pain to left groin and down to calf.  No known enticing event.  Pain approximately 1 week. Can continue Mobic/Tylenol and Biofreeze. IM Depo-Medrol injection provided today.  Shorter course of prednisone taper to start tomorrow. Zanaflex 2-4 mg every 8 hours as needed with precautions for sedation. - Ambulatory referral to Orthopedic Surgery per patient request - methylPREDNISolone acetate (DEPO-MEDROL) injection 80 mg  Left hip pain In addition, her exam is positive for left hip pain.   We discussed today she may also need an additional workup for the left anterior hip pain, which she would also like to have completed at [facility] - methylPREDNISolone acetate (DEPO-MEDROL) injection 80 mg  Reviewed expectations re: course of current medical issues. Discussed self-management of symptoms. Outlined signs and symptoms indicating need for more acute intervention. Patient verbalized  understanding and all questions were answered. Patient received an After-Visit Summary.    No orders of the defined types were placed in this encounter.  No orders of the defined types were placed in this encounter.  Referral Orders  No referral(s) requested today     Note is dictated utilizing voice recognition software. Although note has been proof read prior to signing, occasional typographical errors still can be missed. If any questions arise, please do not hesitate to call for verification.   electronically signed by:  Felix Pacini, DO  [facility] [place]

## 2023-05-23 NOTE — Patient Instructions (Signed)

## 2023-06-24 DIAGNOSIS — M5416 Radiculopathy, lumbar region: Secondary | ICD-10-CM | POA: Diagnosis not present

## 2023-06-24 DIAGNOSIS — M5451 Vertebrogenic low back pain: Secondary | ICD-10-CM | POA: Diagnosis not present

## 2023-07-09 ENCOUNTER — Encounter: Payer: Self-pay | Admitting: Cardiovascular Disease

## 2023-07-09 ENCOUNTER — Ambulatory Visit: Payer: Medicare Other | Attending: Cardiovascular Disease | Admitting: Cardiovascular Disease

## 2023-07-09 VITALS — BP 122/84 | HR 77 | Ht 60.0 in | Wt 146.0 lb

## 2023-07-09 DIAGNOSIS — I2510 Atherosclerotic heart disease of native coronary artery without angina pectoris: Secondary | ICD-10-CM | POA: Diagnosis not present

## 2023-07-09 DIAGNOSIS — E782 Mixed hyperlipidemia: Secondary | ICD-10-CM

## 2023-07-09 DIAGNOSIS — I10 Essential (primary) hypertension: Secondary | ICD-10-CM

## 2023-07-09 NOTE — Patient Instructions (Signed)
 Medication Instructions:  Your physician recommends that you continue on your current medications as directed. Please refer to the Current Medication list given to you today.  *If you need a refill on your cardiac medications before your next appointment, please call your pharmacy*   Follow-Up: At [facility], you and your health needs are our priority.  As part of our continuing mission to provide you with exceptional heart care, we have created designated Provider Care Teams.  These Care Teams include your primary Cardiologist (physician) and Advanced Practice Providers (APPs -  Physician Assistants and Nurse Practitioners) who all work together to provide you with the care you need, when you need it.  We recommend signing up for the patient portal called MyChart.  Sign up information is provided on this After Visit Summary.  MyChart is used to connect with patients for Virtual Visits (Telemedicine).  Patients are able to view lab/test results, encounter notes, upcoming appointments, etc.  Non-urgent messages can be sent to your provider as well.   To learn more about what you can do with MyChart, go to [email or web address].    Your next appointment:   12 month(s)  Provider:   Jon Hails, PA-C, Rollo Louder, PA-C, Hao Meng, PA-C, Damien Braver, NP, or Katlyn West, NP        Then, Dorn Lesches, MD  will plan to see you again in 2 year(s).  Other Instructions

## 2023-07-09 NOTE — Assessment & Plan Note (Signed)
 History of coronary artery disease by coronary CTA performed 05/13/2022 revealing a coronary calcium  score of 335 with nonobstructive CAD.  She denies chest pain.

## 2023-07-09 NOTE — Progress Notes (Signed)
 07/09/2023 Ramie Palladino   [birth year]  [id number]  Primary Physician Catherine Charlies LABOR, DO Primary Cardiologist: Dorn JINNY Lesches MD FACP, [place], [place], [place]  HPI:  Ana Johnston is a 78 y.o.  moderately overweight widowed Caucasian female mother of 2 children, grandmother of 2 grandchildren who I last saw in the office 08/21/2022.  She was referred by [facility] emergency room because recent visit due to shortness of breath. She apparently had an elevated D-dimer and CRP level. 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 Risk factors include 40 pack years tobacco abuse having quit 4 years ago with COPD. She has treated hypertension as well as family history of heart disease the father who died of an MI in his 39s and a brother who had bypass surgery as well. She is never had a heart attack or stroke. She does complain of increasing shortness of breath over the last several months as well as atypical chest pain. She was recently seen at [facility] emergency room with shortness of breath. D-dimer was apparently elevated as was CRP. She also had hypokalemia and hypomagnesemia for unclear reasons.  I obtained a coronary CTA on her 05/13/2022 that revealed a coronary calcium  score of 335 with nonobstructive CAD.  Interestingly, she did have a right lower lobe segmental pulmonary embolism as well.  I began her on a Xarelto  starter pack and she has been on Xarelto  since.  A 2D echocardiogram revealed normal LV and RV systolic.  Lower extremity venous Doppler studies showed no evidence of DVT.   Since I saw her in the office almost a year ago she has done well.  She did have a small subsegmental pulmonary embolus on Xarelto  for 3 months which was discontinued.  She is otherwise fairly active doing water aerobics and walking during better weather.  She is completely asymptomatic.   Current Meds  Medication Sig   amLODipine  (NORVASC ) 5 MG tablet Take 1 tablet (5 mg total) by mouth daily.    atorvastatin  (LIPITOR) 20 MG tablet Take 1 tablet (20 mg total) by mouth daily.   budesonide  (ENTOCORT EC ) 3 MG 24 hr capsule Take 6 mg by mouth daily.   busPIRone  (BUSPAR ) 5 MG tablet Take 1 tablet (5 mg total) by mouth 2 (two) times daily.   calcium -vitamin D  (OSCAL WITH D) 500-200 MG-UNIT tablet Take 1 tablet by mouth daily with breakfast.   Cyanocobalamin  (VITAMIN B-12 PO) Take 1 capsule by mouth daily.   hydrochlorothiazide  (MICROZIDE ) 12.5 MG capsule Take 1 capsule (12.5 mg total) by mouth as needed.   magnesium  30 MG tablet Take 30 mg by mouth daily.   Melatonin 10 MG TABS Take 10 mg by mouth as needed.   meloxicam  (MOBIC ) 15 MG tablet Take 1 tablet (15 mg total) by mouth daily.   montelukast  (SINGULAIR ) 10 MG tablet Take 1 tablet (10 mg total) by mouth daily.   sertraline  (ZOLOFT ) 50 MG tablet Take 1 tablet (50 mg total) by mouth daily.   tiZANidine  (ZANAFLEX ) 4 MG tablet Take 0.5-1 tablets (2-4 mg total) by mouth every 8 (eight) hours as needed for muscle spasms.   TRELEGY ELLIPTA  200-62.5-25 MCG/ACT AEPB Inhale 1 Inhalation into the lungs daily.   XYZAL  ALLERGY  24HR 5 MG tablet Take 1 tablet (5 mg total) by mouth every evening.     No Known Allergies  Social History   Socioeconomic History   Marital status: Widowed    Spouse name: Not on file  Number of children: Not on file   Years of education: Not on file   Highest education level: Not on file  Occupational History   Occupation: retired  Tobacco Use   Smoking status: Former    Types: Cigarettes    Passive exposure: Never   Smokeless tobacco: Never   Tobacco comments:    1/2 ppd x 50+ years  Vaping Use   Vaping status: Never Used  Substance and Sexual Activity   Alcohol use: Yes    Comment: 2 glasses of wine a day   Drug use: Never   Sexual activity: Not Currently    Partners: Male  Other Topics Concern   Not on file  Social History Narrative   Marital status/children/pets:  widowed, G2P2    Education/employment: 12th grade education, retired   Field Seismologist:      -smoke alarm in the home:Yes     - wears seatbelt: Yes     - Feels safe in their relationships: Yes      Social Drivers of Corporate Investment Banker Strain: Low Risk  (05/10/2023)   Received from [facility]   Overall Financial Resource Strain (CARDIA)    Difficulty of Paying Living Expenses: Not hard at all  Food Insecurity: Unknown (05/10/2023)   Received from [facility]   Hunger Vital Sign    Worried About Running Out of Food in the Last Year: Never true    Ran Out of Food in the Last Year: Patient declined  Transportation Needs: Patient Declined (05/10/2023)   Received from [facility] - Transportation    Lack of Transportation (Medical): Patient declined    Lack of Transportation (Non-Medical): Patient declined  Physical Activity: Unknown (05/10/2023)   Received from [facility]   Exercise Vital Sign    Days of Exercise per Week: Patient declined    Minutes of Exercise per Session: 0 min  Stress: Patient Declined (05/10/2023)   Received from [facility] - Occupational Stress Questionnaire    Feeling of Stress : Patient declined  Social Connections: Patient Declined (05/10/2023)   Received from [facility]   Social Network    How would you rate your social network (family, work, friends)?: Patient declined  Intimate Partner Violence: Patient Declined (05/10/2023)   Received from [facility]   HITS    Over the last 12 months how often did your partner physically hurt you?: Patient declined    Over the last 12 months how often did your partner insult you or talk down to you?: Patient declined    Over the last 12 months how often did your partner threaten you with physical harm?: Patient declined    Over the last 12 months how often did your partner scream or curse at you?: Patient declined     Review of Systems: General: negative for chills,  fever, night sweats or weight changes.  Cardiovascular: negative for chest pain, dyspnea on exertion, edema, orthopnea, palpitations, paroxysmal nocturnal dyspnea or shortness of breath Dermatological: negative for rash Respiratory: negative for cough or wheezing Urologic: negative for hematuria Abdominal: negative for nausea, vomiting, diarrhea, bright red blood per rectum, melena, or hematemesis Neurologic: negative for visual changes, syncope, or dizziness All other systems reviewed and are otherwise negative except as noted above.    Blood pressure 122/84, pulse 77, height 5' (1.524 m), weight 146 lb (66.2 kg), SpO2 98%.  General appearance: alert and no distress Neck: no adenopathy,  no carotid bruit, no JVD, supple, symmetrical, trachea midline, and thyroid  not enlarged, symmetric, no tenderness/mass/nodules Lungs: clear to auscultation bilaterally Heart: regular rate and rhythm, S1, S2 normal, no murmur, click, rub or gallop Extremities: extremities normal, atraumatic, no cyanosis or edema Pulses: 2+ and symmetric Skin: Skin color, texture, turgor normal. No rashes or lesions Neurologic: Grossly normal  EKG EKG Interpretation Date/Time:  Wednesday July 09 2023 11:19:54 EST Ventricular Rate:  77 PR Interval:  162 QRS Duration:  80 QT Interval:  376 QTC Calculation: 425 R Axis:   -29  Text Interpretation: Normal sinus rhythm Left ventricular hypertrophy with repolarization abnormality ( R in aVL ) Cannot rule out Septal infarct , age undetermined When compared with ECG of 12-Dec-2021 12:43, PREVIOUS ECG IS PRESENT Confirmed by Court Carrier [phone]) on 07/09/2023 11:56:22 AM    ASSESSMENT AND PLAN:   Essential hypertension History of essential hypertension her blood pressure measured today at 122/84.  She is on amlodipine , and hydrochlorothiazide .  Hyperlipidemia History of hyperlipidemia on statin therapy with lipid profile performed 08/22/2022 revealing total  cholesterol 134, LDL 55 and HDL of 57.  Coronary artery disease History of coronary artery disease by coronary CTA performed 05/13/2022 revealing a coronary calcium  score of 335 with nonobstructive CAD.  She denies chest pain.     Carrier DOROTHA Court MD FACP,FACC,FAHA, [facility] 07/09/2023 12:02 PM

## 2023-07-09 NOTE — Assessment & Plan Note (Signed)
 History of essential hypertension her blood pressure measured today at 122/84.  She is on amlodipine , and hydrochlorothiazide .

## 2023-07-09 NOTE — Assessment & Plan Note (Signed)
 History of hyperlipidemia on statin therapy with lipid profile performed 08/22/2022 revealing total cholesterol 134, LDL 55 and HDL of 57.

## 2023-07-15 DIAGNOSIS — R059 Cough, unspecified: Secondary | ICD-10-CM | POA: Diagnosis not present

## 2023-07-15 DIAGNOSIS — J101 Influenza due to other identified influenza virus with other respiratory manifestations: Secondary | ICD-10-CM | POA: Diagnosis not present

## 2023-07-15 DIAGNOSIS — Z03818 Encounter for observation for suspected exposure to other biological agents ruled out: Secondary | ICD-10-CM | POA: Diagnosis not present

## 2023-08-07 DIAGNOSIS — M5416 Radiculopathy, lumbar region: Secondary | ICD-10-CM | POA: Diagnosis not present

## 2023-08-13 DIAGNOSIS — M5416 Radiculopathy, lumbar region: Secondary | ICD-10-CM | POA: Diagnosis not present

## 2023-08-21 DIAGNOSIS — M5416 Radiculopathy, lumbar region: Secondary | ICD-10-CM | POA: Diagnosis not present

## 2023-08-27 DIAGNOSIS — Z961 Presence of intraocular lens: Secondary | ICD-10-CM | POA: Diagnosis not present

## 2023-08-27 DIAGNOSIS — H04129 Dry eye syndrome of unspecified lacrimal gland: Secondary | ICD-10-CM | POA: Diagnosis not present

## 2023-08-27 DIAGNOSIS — H52223 Regular astigmatism, bilateral: Secondary | ICD-10-CM | POA: Diagnosis not present

## 2023-08-27 DIAGNOSIS — H5212 Myopia, left eye: Secondary | ICD-10-CM | POA: Diagnosis not present

## 2023-08-27 DIAGNOSIS — H02889 Meibomian gland dysfunction of unspecified eye, unspecified eyelid: Secondary | ICD-10-CM | POA: Diagnosis not present

## 2023-08-27 DIAGNOSIS — H524 Presbyopia: Secondary | ICD-10-CM | POA: Diagnosis not present

## 2023-09-10 DIAGNOSIS — K52832 Lymphocytic colitis: Secondary | ICD-10-CM | POA: Diagnosis not present

## 2023-09-18 ENCOUNTER — Emergency Department (HOSPITAL_COMMUNITY)

## 2023-09-18 ENCOUNTER — Inpatient Hospital Stay (HOSPITAL_COMMUNITY)
Admission: EM | Admit: 2023-09-18 | Discharge: 2023-09-22 | DRG: 199 | Disposition: A | Discharge status: Home Health | Attending: Surgery | Admitting: Surgery

## 2023-09-18 ENCOUNTER — Encounter (HOSPITAL_COMMUNITY): Payer: Self-pay

## 2023-09-18 ENCOUNTER — Inpatient Hospital Stay (HOSPITAL_COMMUNITY)

## 2023-09-18 DIAGNOSIS — R55 Syncope and collapse: Secondary | ICD-10-CM

## 2023-09-18 DIAGNOSIS — N179 Acute kidney failure, unspecified: Secondary | ICD-10-CM | POA: Diagnosis present

## 2023-09-18 DIAGNOSIS — F10120 Alcohol abuse with intoxication, uncomplicated: Secondary | ICD-10-CM | POA: Diagnosis not present

## 2023-09-18 DIAGNOSIS — E876 Hypokalemia: Secondary | ICD-10-CM | POA: Diagnosis not present

## 2023-09-18 DIAGNOSIS — R918 Other nonspecific abnormal finding of lung field: Secondary | ICD-10-CM | POA: Diagnosis not present

## 2023-09-18 DIAGNOSIS — S272XXA Traumatic hemopneumothorax, initial encounter: Secondary | ICD-10-CM | POA: Diagnosis not present

## 2023-09-18 DIAGNOSIS — S299XXD Unspecified injury of thorax, subsequent encounter: Secondary | ICD-10-CM | POA: Diagnosis not present

## 2023-09-18 DIAGNOSIS — T797XXA Traumatic subcutaneous emphysema, initial encounter: Secondary | ICD-10-CM | POA: Diagnosis present

## 2023-09-18 DIAGNOSIS — Z8619 Personal history of other infectious and parasitic diseases: Secondary | ICD-10-CM | POA: Diagnosis not present

## 2023-09-18 DIAGNOSIS — Z803 Family history of malignant neoplasm of breast: Secondary | ICD-10-CM

## 2023-09-18 DIAGNOSIS — R42 Dizziness and giddiness: Secondary | ICD-10-CM

## 2023-09-18 DIAGNOSIS — I10 Essential (primary) hypertension: Secondary | ICD-10-CM | POA: Diagnosis not present

## 2023-09-18 DIAGNOSIS — S270XXA Traumatic pneumothorax, initial encounter: Principal | ICD-10-CM

## 2023-09-18 DIAGNOSIS — Z833 Family history of diabetes mellitus: Secondary | ICD-10-CM

## 2023-09-18 DIAGNOSIS — J939 Pneumothorax, unspecified: Secondary | ICD-10-CM | POA: Diagnosis present

## 2023-09-18 DIAGNOSIS — Z86711 Personal history of pulmonary embolism: Secondary | ICD-10-CM | POA: Diagnosis not present

## 2023-09-18 DIAGNOSIS — Z7951 Long term (current) use of inhaled steroids: Secondary | ICD-10-CM

## 2023-09-18 DIAGNOSIS — W19XXXA Unspecified fall, initial encounter: Secondary | ICD-10-CM

## 2023-09-18 DIAGNOSIS — R296 Repeated falls: Secondary | ICD-10-CM | POA: Diagnosis present

## 2023-09-18 DIAGNOSIS — R0789 Other chest pain: Secondary | ICD-10-CM | POA: Diagnosis not present

## 2023-09-18 DIAGNOSIS — S2249XA Multiple fractures of ribs, unspecified side, initial encounter for closed fracture: Secondary | ICD-10-CM

## 2023-09-18 DIAGNOSIS — Z9181 History of falling: Secondary | ICD-10-CM

## 2023-09-18 DIAGNOSIS — Z79899 Other long term (current) drug therapy: Secondary | ICD-10-CM

## 2023-09-18 DIAGNOSIS — Y92002 Bathroom of unspecified non-institutional (private) residence single-family (private) house as the place of occurrence of the external cause: Secondary | ICD-10-CM | POA: Diagnosis not present

## 2023-09-18 DIAGNOSIS — Z90710 Acquired absence of both cervix and uterus: Secondary | ICD-10-CM

## 2023-09-18 DIAGNOSIS — J942 Hemothorax: Secondary | ICD-10-CM | POA: Diagnosis not present

## 2023-09-18 DIAGNOSIS — S2241XA Multiple fractures of ribs, right side, initial encounter for closed fracture: Principal | ICD-10-CM | POA: Diagnosis present

## 2023-09-18 DIAGNOSIS — Z87891 Personal history of nicotine dependence: Secondary | ICD-10-CM

## 2023-09-18 DIAGNOSIS — J9601 Acute respiratory failure with hypoxia: Secondary | ICD-10-CM | POA: Diagnosis not present

## 2023-09-18 DIAGNOSIS — R0781 Pleurodynia: Secondary | ICD-10-CM | POA: Diagnosis not present

## 2023-09-18 DIAGNOSIS — Z83438 Family history of other disorder of lipoprotein metabolism and other lipidemia: Secondary | ICD-10-CM

## 2023-09-18 DIAGNOSIS — Z8249 Family history of ischemic heart disease and other diseases of the circulatory system: Secondary | ICD-10-CM | POA: Diagnosis not present

## 2023-09-18 DIAGNOSIS — J449 Chronic obstructive pulmonary disease, unspecified: Secondary | ICD-10-CM | POA: Diagnosis not present

## 2023-09-18 DIAGNOSIS — Y92009 Unspecified place in unspecified non-institutional (private) residence as the place of occurrence of the external cause: Secondary | ICD-10-CM

## 2023-09-18 DIAGNOSIS — I700 Atherosclerosis of aorta: Secondary | ICD-10-CM | POA: Diagnosis not present

## 2023-09-18 DIAGNOSIS — F411 Generalized anxiety disorder: Secondary | ICD-10-CM | POA: Diagnosis present

## 2023-09-18 DIAGNOSIS — W010XXA Fall on same level from slipping, tripping and stumbling without subsequent striking against object, initial encounter: Secondary | ICD-10-CM | POA: Diagnosis present

## 2023-09-18 DIAGNOSIS — J4489 Other specified chronic obstructive pulmonary disease: Secondary | ICD-10-CM | POA: Diagnosis not present

## 2023-09-18 DIAGNOSIS — J9811 Atelectasis: Secondary | ICD-10-CM | POA: Diagnosis not present

## 2023-09-18 DIAGNOSIS — S2243XA Multiple fractures of ribs, bilateral, initial encounter for closed fracture: Secondary | ICD-10-CM | POA: Diagnosis not present

## 2023-09-18 DIAGNOSIS — J9383 Other pneumothorax: Secondary | ICD-10-CM | POA: Diagnosis not present

## 2023-09-18 DIAGNOSIS — Z791 Long term (current) use of non-steroidal anti-inflammatories (NSAID): Secondary | ICD-10-CM | POA: Diagnosis not present

## 2023-09-18 DIAGNOSIS — I2510 Atherosclerotic heart disease of native coronary artery without angina pectoris: Secondary | ICD-10-CM | POA: Diagnosis present

## 2023-09-18 DIAGNOSIS — F10920 Alcohol use, unspecified with intoxication, uncomplicated: Secondary | ICD-10-CM

## 2023-09-18 DIAGNOSIS — R269 Unspecified abnormalities of gait and mobility: Secondary | ICD-10-CM | POA: Diagnosis not present

## 2023-09-18 DIAGNOSIS — R9082 White matter disease, unspecified: Secondary | ICD-10-CM | POA: Diagnosis not present

## 2023-09-18 DIAGNOSIS — Z043 Encounter for examination and observation following other accident: Secondary | ICD-10-CM | POA: Diagnosis not present

## 2023-09-18 DIAGNOSIS — E785 Hyperlipidemia, unspecified: Secondary | ICD-10-CM | POA: Diagnosis present

## 2023-09-18 DIAGNOSIS — F10129 Alcohol abuse with intoxication, unspecified: Secondary | ICD-10-CM | POA: Diagnosis not present

## 2023-09-18 DIAGNOSIS — M8589 Other specified disorders of bone density and structure, multiple sites: Secondary | ICD-10-CM | POA: Diagnosis present

## 2023-09-18 DIAGNOSIS — M5030 Other cervical disc degeneration, unspecified cervical region: Secondary | ICD-10-CM | POA: Diagnosis not present

## 2023-09-18 HISTORY — DX: Traumatic subcutaneous emphysema, initial encounter: T79.7XXA

## 2023-09-18 HISTORY — DX: Multiple fractures of ribs, unspecified side, initial encounter for closed fracture: S22.49XA

## 2023-09-18 HISTORY — DX: Personal history of other infectious and parasitic diseases: Z86.19

## 2023-09-18 LAB — COMPREHENSIVE METABOLIC PANEL WITH GFR
ALT: 30 U/L (ref 0–44)
AST: 36 U/L (ref 15–41)
Albumin: 3.7 g/dL (ref 3.5–5.0)
Alkaline Phosphatase: 82 U/L (ref 38–126)
Anion gap: 14 (ref 5–15)
BUN: 14 mg/dL (ref 8–23)
CO2: 23 mmol/L (ref 22–32)
Calcium: 9.3 mg/dL (ref 8.9–10.3)
Chloride: 98 mmol/L (ref 98–111)
Creatinine, Ser: 0.5 mg/dL (ref 0.44–1.00)
GFR, Estimated: >60 mL/min (ref >60)
Glucose, Bld: 100 mg/dL — ABNORMAL HIGH (ref 70–99)
Potassium: 3.3 mmol/L — ABNORMAL LOW (ref 3.5–5.1)
Sodium: 135 mmol/L (ref 135–145)
Total Bilirubin: 0.7 mg/dL (ref 0.0–1.2)
Total Protein: 7.1 g/dL (ref 6.5–8.1)

## 2023-09-18 LAB — ECHOCARDIOGRAM COMPLETE
Area-P 1/2: 3.12 cm2
Height: 60 in
S' Lateral: 3.4 cm
Weight: 2352 [oz_av]

## 2023-09-18 LAB — CBC WITH DIFFERENTIAL/PLATELET
Abs Immature Granulocytes: 0.12 10*3/uL — ABNORMAL HIGH (ref 0.00–0.07)
Basophils Absolute: 0.1 10*3/uL (ref 0.0–0.1)
Basophils Relative: 1 %
Eosinophils Absolute: 0 10*3/uL (ref 0.0–0.5)
Eosinophils Relative: 0 %
HCT: 45.4 % (ref 36.0–46.0)
Hemoglobin: 14.8 g/dL (ref 12.0–15.0)
Immature Granulocytes: 1 %
Lymphocytes Relative: 13 %
Lymphs Abs: 1.9 10*3/uL (ref 0.7–4.0)
MCH: 31.4 pg (ref 26.0–34.0)
MCHC: 32.6 g/dL (ref 30.0–36.0)
MCV: 96.4 fL (ref 80.0–100.0)
Monocytes Absolute: 0.7 10*3/uL (ref 0.1–1.0)
Monocytes Relative: 4 %
Neutro Abs: 12.1 10*3/uL — ABNORMAL HIGH (ref 1.7–7.7)
Neutrophils Relative %: 81 %
Platelets: 317 10*3/uL (ref 150–400)
RBC: 4.71 MIL/uL (ref 3.87–5.11)
RDW: 13.6 % (ref 11.5–15.5)
WBC: 14.9 10*3/uL — ABNORMAL HIGH (ref 4.0–10.5)
nRBC: 0 % (ref 0.0–0.2)

## 2023-09-18 LAB — MAGNESIUM: Magnesium: 1.8 mg/dL (ref 1.7–2.4)

## 2023-09-18 LAB — I-STAT CHEM 8, ED
BUN: 13 mg/dL (ref 8–23)
Calcium, Ion: 1.12 mmol/L — ABNORMAL LOW (ref 1.15–1.40)
Chloride: 99 mmol/L (ref 98–111)
Creatinine, Ser: 0.9 mg/dL (ref 0.44–1.00)
Glucose, Bld: 106 mg/dL — ABNORMAL HIGH (ref 70–99)
HCT: 46 % (ref 36.0–46.0)
Hemoglobin: 15.6 g/dL — ABNORMAL HIGH (ref 12.0–15.0)
Potassium: 3.2 mmol/L — ABNORMAL LOW (ref 3.5–5.1)
Sodium: 135 mmol/L (ref 135–145)
TCO2: 23 mmol/L (ref 22–32)

## 2023-09-18 LAB — ETHANOL: Alcohol, Ethyl (B): 157 mg/dL — ABNORMAL HIGH (ref <10)

## 2023-09-18 LAB — PHOSPHORUS: Phosphorus: 2.7 mg/dL (ref 2.5–4.6)

## 2023-09-18 MED ORDER — OYSTER SHELL CALCIUM/D3 500-5 MG-MCG PO TABS
1.0000 | ORAL_TABLET | Freq: Every day | ORAL | Status: DC
  Administered 2023-09-18 – 2023-09-22 (×5): 1 via ORAL
  Filled 2023-09-18 (×6): qty 1

## 2023-09-18 MED ORDER — METOPROLOL TARTRATE 5 MG/5ML IV SOLN: 5.0000 mg | Freq: Four times a day (QID) | INTRAVENOUS | Status: DC | PRN

## 2023-09-18 MED ORDER — BUDESON-GLYCOPYRROL-FORMOTEROL 160-9-4.8 MCG/ACT IN AERO
2.0000 | INHALATION_SPRAY | Freq: Two times a day (BID) | RESPIRATORY_TRACT | Status: DC
  Administered 2023-09-18 – 2023-09-22 (×8): 2 via RESPIRATORY_TRACT
  Filled 2023-09-18: qty 5.9

## 2023-09-18 MED ORDER — MONTELUKAST SODIUM 10 MG PO TABS
10.0000 mg | ORAL_TABLET | Freq: Every day | ORAL | Status: DC
  Administered 2023-09-18 – 2023-09-22 (×5): 10 mg via ORAL
  Filled 2023-09-18 (×5): qty 1

## 2023-09-18 MED ORDER — NAPHAZOLINE-GLYCERIN 0.012-0.25 % OP SOLN: 1.0000 [drp] | Freq: Four times a day (QID) | OPHTHALMIC | Status: DC | PRN

## 2023-09-18 MED ORDER — ATORVASTATIN CALCIUM 10 MG PO TABS
20.0000 mg | ORAL_TABLET | Freq: Every day | ORAL | Status: DC
  Administered 2023-09-18 – 2023-09-22 (×5): 20 mg via ORAL
  Filled 2023-09-18 (×5): qty 2

## 2023-09-18 MED ORDER — POLYETHYLENE GLYCOL 3350 17 G PO PACK: 17.0000 g | PACK | Freq: Every day | ORAL | Status: DC | PRN

## 2023-09-18 MED ORDER — CALCIUM POLYCARBOPHIL 625 MG PO TABS
625.0000 mg | ORAL_TABLET | Freq: Two times a day (BID) | ORAL | Status: DC
  Administered 2023-09-18 – 2023-09-22 (×9): 625 mg via ORAL
  Filled 2023-09-18 (×10): qty 1

## 2023-09-18 MED ORDER — LEVOCETIRIZINE DIHYDROCHLORIDE 5 MG PO TABS: 5.0000 mg | ORAL_TABLET | Freq: Every evening | ORAL | Status: DC

## 2023-09-18 MED ORDER — OXYCODONE HCL 5 MG PO TABS
5.0000 mg | ORAL_TABLET | ORAL | Status: DC | PRN
Start: 2023-09-18 — End: 2023-09-23
  Administered 2023-09-19 – 2023-09-21 (×9): 10 mg via ORAL
  Administered 2023-09-22 (×2): 5 mg via ORAL
  Filled 2023-09-18: qty 1
  Filled 2023-09-18 (×3): qty 2
  Filled 2023-09-18: qty 1
  Filled 2023-09-18 (×6): qty 2

## 2023-09-18 MED ORDER — MAGNESIUM 30 MG PO TABS: 30.0000 mg | ORAL_TABLET | Freq: Every day | ORAL | Status: DC

## 2023-09-18 MED ORDER — METHOCARBAMOL 500 MG PO TABS
500.0000 mg | ORAL_TABLET | Freq: Three times a day (TID) | ORAL | Status: AC
  Administered 2023-09-18 – 2023-09-20 (×9): 500 mg via ORAL
  Filled 2023-09-18 (×9): qty 1

## 2023-09-18 MED ORDER — NAPROXEN 250 MG PO TABS: 375.0000 mg | ORAL_TABLET | Freq: Three times a day (TID) | ORAL | Status: DC

## 2023-09-18 MED ORDER — HYDROMORPHONE HCL 1 MG/ML IJ SOLN
0.5000 mg | INTRAMUSCULAR | Status: AC | PRN
  Administered 2023-09-18: 1 mg via INTRAVENOUS
  Administered 2023-09-18 (×2): 0.5 mg via INTRAVENOUS
  Filled 2023-09-18 (×3): qty 1

## 2023-09-18 MED ORDER — LACTATED RINGERS IV SOLN
INTRAVENOUS | Status: AC
Start: 2023-09-18 — End: 2023-09-19

## 2023-09-18 MED ORDER — DIPHENHYDRAMINE HCL 50 MG/ML IJ SOLN: 12.5000 mg | Freq: Four times a day (QID) | INTRAMUSCULAR | Status: DC | PRN

## 2023-09-18 MED ORDER — HYDROMORPHONE HCL 1 MG/ML IJ SOLN: 1.0000 mg | INTRAMUSCULAR | Status: DC | PRN

## 2023-09-18 MED ORDER — LACTATED RINGERS IV BOLUS: 1000.0000 mL | Freq: Three times a day (TID) | INTRAVENOUS | Status: AC | PRN

## 2023-09-18 MED ORDER — HYDRALAZINE HCL 20 MG/ML IJ SOLN: 10.0000 mg | INTRAMUSCULAR | Status: DC | PRN

## 2023-09-18 MED ORDER — VITAMIN B-12 1000 MCG PO TABS
1000.0000 ug | ORAL_TABLET | Freq: Every day | ORAL | Status: DC
  Administered 2023-09-18 – 2023-09-22 (×5): 1000 ug via ORAL
  Filled 2023-09-18 (×5): qty 1

## 2023-09-18 MED ORDER — OYSTER SHELL CALCIUM/D3 500-5 MG-MCG PO TABS
1.0000 | ORAL_TABLET | Freq: Every day | ORAL | Status: DC
  Filled 2023-09-18: qty 1

## 2023-09-18 MED ORDER — MAGIC MOUTHWASH: 15.0000 mL | Freq: Four times a day (QID) | ORAL | Status: DC | PRN

## 2023-09-18 MED ORDER — BUDESONIDE 3 MG PO CPEP
6.0000 mg | ORAL_CAPSULE | Freq: Every day | ORAL | Status: DC
  Administered 2023-09-19 – 2023-09-22 (×4): 6 mg via ORAL
  Filled 2023-09-18 (×4): qty 2

## 2023-09-18 MED ORDER — IOHEXOL 300 MG/ML  SOLN
100.0000 mL | Freq: Once | INTRAMUSCULAR | Status: AC | PRN
  Administered 2023-09-18: 100 mL via INTRAVENOUS

## 2023-09-18 MED ORDER — SODIUM CHLORIDE (PF) 0.9 % IJ SOLN
INTRAMUSCULAR | Status: AC
  Filled 2023-09-18: qty 50

## 2023-09-18 MED ORDER — NAPROXEN 250 MG PO TABS
250.0000 mg | ORAL_TABLET | Freq: Three times a day (TID) | ORAL | Status: DC
  Administered 2023-09-18 – 2023-09-22 (×11): 250 mg via ORAL
  Filled 2023-09-18 (×11): qty 1

## 2023-09-18 MED ORDER — TIZANIDINE HCL 4 MG PO TABS: 2.0000 mg | ORAL_TABLET | Freq: Three times a day (TID) | ORAL | Status: DC | PRN

## 2023-09-18 MED ORDER — HYDROMORPHONE HCL 1 MG/ML IJ SOLN
0.5000 mg | INTRAMUSCULAR | Status: DC | PRN
  Administered 2023-09-18 – 2023-09-19 (×2): 1 mg via INTRAVENOUS
  Filled 2023-09-18 (×3): qty 1

## 2023-09-18 MED ORDER — METHOCARBAMOL 1000 MG/10ML IJ SOLN
500.0000 mg | Freq: Once | INTRAMUSCULAR | Status: AC
  Administered 2023-09-18: 500 mg via INTRAMUSCULAR
  Filled 2023-09-18: qty 10

## 2023-09-18 MED ORDER — PERFLUTREN LIPID MICROSPHERE
1.0000 mL | INTRAVENOUS | Status: AC | PRN
  Administered 2023-09-18: 4 mL via INTRAVENOUS

## 2023-09-18 MED ORDER — POTASSIUM CHLORIDE CRYS ER 20 MEQ PO TBCR
40.0000 meq | EXTENDED_RELEASE_TABLET | Freq: Once | ORAL | Status: AC
  Administered 2023-09-18: 40 meq via ORAL
  Filled 2023-09-18: qty 2

## 2023-09-18 MED ORDER — MENTHOL 3 MG MT LOZG: 1.0000 | LOZENGE | OROMUCOSAL | Status: DC | PRN

## 2023-09-18 MED ORDER — BUSPIRONE HCL 5 MG PO TABS
5.0000 mg | ORAL_TABLET | Freq: Two times a day (BID) | ORAL | Status: DC
Start: 2023-09-18 — End: 2023-09-23
  Administered 2023-09-18 – 2023-09-22 (×9): 5 mg via ORAL
  Filled 2023-09-18 (×9): qty 1

## 2023-09-18 MED ORDER — SERTRALINE HCL 50 MG PO TABS
50.0000 mg | ORAL_TABLET | Freq: Every day | ORAL | Status: DC
  Administered 2023-09-18 – 2023-09-22 (×5): 50 mg via ORAL
  Filled 2023-09-18 (×5): qty 1

## 2023-09-18 MED ORDER — MAGNESIUM SULFATE 2 GM/50ML IV SOLN
2.0000 g | Freq: Once | INTRAVENOUS | Status: AC
  Administered 2023-09-18: 2 g via INTRAVENOUS
  Filled 2023-09-18: qty 50

## 2023-09-18 MED ORDER — ENOXAPARIN SODIUM 30 MG/0.3ML IJ SOSY
30.0000 mg | PREFILLED_SYRINGE | Freq: Two times a day (BID) | INTRAMUSCULAR | Status: DC
  Administered 2023-09-19 – 2023-09-22 (×7): 30 mg via SUBCUTANEOUS
  Filled 2023-09-18 (×7): qty 0.3

## 2023-09-18 MED ORDER — ALBUTEROL SULFATE (2.5 MG/3ML) 0.083% IN NEBU: 3.0000 mL | INHALATION_SOLUTION | Freq: Four times a day (QID) | RESPIRATORY_TRACT | Status: DC | PRN

## 2023-09-18 MED ORDER — ONDANSETRON HCL 4 MG/2ML IJ SOLN
4.0000 mg | Freq: Four times a day (QID) | INTRAMUSCULAR | Status: DC | PRN
  Administered 2023-09-18 – 2023-09-21 (×2): 4 mg via INTRAVENOUS
  Filled 2023-09-18 (×2): qty 2

## 2023-09-18 MED ORDER — METHOCARBAMOL 1000 MG/10ML IJ SOLN: 500.0000 mg | Freq: Three times a day (TID) | INTRAMUSCULAR | Status: AC

## 2023-09-18 MED ORDER — LIDOCAINE 5 % EX PTCH
2.0000 | MEDICATED_PATCH | CUTANEOUS | Status: DC
  Administered 2023-09-18 – 2023-09-22 (×5): 2 via TRANSDERMAL
  Filled 2023-09-18 (×6): qty 2

## 2023-09-18 MED ORDER — SALINE SPRAY 0.65 % NA SOLN: 1.0000 | Freq: Four times a day (QID) | NASAL | Status: DC | PRN

## 2023-09-18 MED ORDER — PHENOL 1.4 % MT LIQD: 2.0000 | OROMUCOSAL | Status: DC | PRN

## 2023-09-18 MED ORDER — DOCUSATE SODIUM 100 MG PO CAPS
100.0000 mg | ORAL_CAPSULE | Freq: Two times a day (BID) | ORAL | Status: DC
  Administered 2023-09-18 – 2023-09-22 (×9): 100 mg via ORAL
  Filled 2023-09-18 (×9): qty 1

## 2023-09-18 MED ORDER — ACETAMINOPHEN 500 MG PO TABS: 1000.0000 mg | ORAL_TABLET | Freq: Four times a day (QID) | ORAL | Status: DC | PRN

## 2023-09-18 MED ORDER — BISACODYL 10 MG RE SUPP: 10.0000 mg | Freq: Two times a day (BID) | RECTAL | Status: DC | PRN

## 2023-09-18 MED ORDER — HYDRALAZINE HCL 20 MG/ML IJ SOLN: 5.0000 mg | INTRAMUSCULAR | Status: DC | PRN

## 2023-09-18 NOTE — ED Provider Notes (Signed)
 [place] EMERGENCY DEPARTMENT AT [facility] Provider Note  CSN: [id number] Arrival date & time: 09/18/23 0144  Chief Complaint(s) Alcohol Intoxication, Fall, and Chest Injury  HPI Ana Johnston is a 78 y.o. female here with right posterior chest wall pain following a mechanical fall at home.  She reports losing her balance while in the bathroom causing her to fall and hit her right chest onto the toile.  She denies any head trauma or loss of consciousness.  Denies any headache, neck pain, back pain, anterior chest pain, abdominal pain, hip pain or extremity pain.  Patient reports being able to crawl to the phone to call for help.  She did admit to drinking small amount of alcohol last night with dinner.  Reports that she went to bed around 11pm and awoke at 1am to use the restroom.  The history is provided by the patient.    Past Medical History Past Medical History:  Diagnosis Date   Acute pulmonary embolism without acute cor pulmonale (HCC) 05/17/2022   Formatting of this note might be different from the original. CTA of the Chest (05/13/2022): Small subsegmental right lower lobe pulmonary embolus, likely subacute in age given its relatively peripheral location within the vessel.     Arthritis    Atypical chest pain 04/09/2022   Atypical chest pain     Clostridium difficile diarrhea 05/25/2022   Colitis    Depression    Diverticulitis    Dyspnea on exertion 04/09/2022   Dyspnea on exertion     Hypertension    Seasonal allergies    Patient Active Problem List   Diagnosis Date Noted   Fall at home 09/18/2023   Pneumothorax, right 09/18/2023   Cervical spinal stenosis 05/16/2023   Traumatic fracture of ribs 7-10 of right side with pneumothorax 10/29/2022   Hyperlipidemia 08/21/2022   Coronary artery disease 08/21/2022   Generalized anxiety disorder 04/17/2022   Essential hypertension 04/09/2022   COPD (chronic obstructive pulmonary disease) (HCC) 04/09/2022    Family history of heart disease 04/09/2022   Nuclear sclerotic cataract of left eye 07/29/2014   Osteopenia of multiple sites (-1.01-2020) 06/10/2012   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 Authorizing Provider  albuterol (VENTOLIN HFA) 108 (90 Base) MCG/ACT inhaler Inhale 2 puffs into the lungs every 6 (six) hours as needed for wheezing or shortness of breath. Patient not taking: Reported on 07/09/2023 05/16/23   Felix Pacini A, DO  amLODipine (NORVASC) 5 MG tablet Take 1 tablet (5 mg total) by mouth daily. 05/16/23   Kuneff, Renee A, DO  atorvastatin (LIPITOR) 20 MG tablet Take 1 tablet (20 mg total) by mouth daily. 05/16/23   Kuneff, Renee A, DO  budesonide (ENTOCORT EC) 3 MG 24 hr capsule Take 6 mg by mouth daily.    [provider]  busPIRone (BUSPAR) 5 MG tablet Take 1 tablet (5 mg total) by mouth 2 (two) times daily. 05/16/23   Kuneff, Renee A, DO  calcium-vitamin D (OSCAL WITH D) 500-200 MG-UNIT tablet Take 1 tablet by mouth daily with breakfast.    [provider]  Cholecalciferol (VITAMIN D3) 1000 units CHEW Chew 1 tablet by mouth daily. Patient not taking: Reported on 07/09/2023    [provider]  Cyanocobalamin (VITAMIN B-12 PO) Take 1 capsule by mouth daily.    [provider]  hydrochlorothiazide (MICROZIDE) 12.5 MG capsule Take 1 capsule (12.5  mg total) by mouth as needed. 05/16/23   Kuneff, Renee A, DO  magnesium 30 MG tablet Take 30 mg by mouth daily.    [provider]  Melatonin 10 MG TABS Take 10 mg by mouth as needed.    [provider]  meloxicam (MOBIC) 15 MG tablet Take 1 tablet (15 mg total) by mouth daily. 05/16/23   Kuneff, Renee A, DO  montelukast (SINGULAIR) 10 MG tablet Take 1 tablet (10 mg total) by mouth daily. 05/16/23   Kuneff, Renee A, DO  predniSONE (DELTASONE) 20 MG tablet 60 mg x1d, 40 mg  x3d, 20 mg x2d, 10 mg x2d Patient not taking: Reported on 07/09/2023 05/24/23   Claiborne Billings, Renee A, DO  sertraline (ZOLOFT) 50 MG tablet Take 1 tablet (50 mg total) by mouth daily. 05/16/23   Kuneff, Renee A, DO  tiZANidine (ZANAFLEX) 4 MG tablet Take 0.5-1 tablets (2-4 mg total) by mouth every 8 (eight) hours as needed for muscle spasms. 05/23/23   Kuneff, Renee A, DO  TRELEGY ELLIPTA 200-62.5-25 MCG/ACT AEPB Inhale 1 Inhalation into the lungs daily. 05/16/23   Kuneff, Renee A, DO  XYZAL ALLERGY 24HR 5 MG tablet Take 1 tablet (5 mg total) by mouth every evening. 05/16/23   Kuneff, Renee A, DO                                                                                                                                    Allergies Patient has no known allergies.  Review of Systems Review of Systems As noted in HPI  Physical Exam Vital Signs  I have reviewed the triage vital signs BP 105/61 (BP Location: Right Arm)   Pulse 73   Temp 98.1 F (36.7 C) (Oral)   Resp 16   Ht 5' (1.524 m)   Wt 66.7 kg   SpO2 100%   BMI 28.71 kg/m   Physical Exam Constitutional:      General: She is not in acute distress.    Appearance: She is well-developed. She is not diaphoretic.  HENT:     Head: Normocephalic and atraumatic.     Right Ear: External ear normal.     Left Ear: External ear normal.     Nose: Nose normal.  Eyes:     General: No scleral icterus.       Right eye: No discharge.        Left eye: No discharge.     Conjunctiva/sclera: Conjunctivae normal.     Pupils: Pupils are equal, round, and reactive to light.  Cardiovascular:     Rate and Rhythm: Normal rate and regular rhythm.     Pulses:          Radial pulses are 2+ on the right side and 2+ on the left side.       Dorsalis pedis pulses are 2+ on the right side and 2+ on the left side.  Heart sounds: Normal heart sounds. No murmur heard.    No friction rub. No gallop.  Pulmonary:     Effort: Pulmonary effort is normal. No  respiratory distress.     Breath sounds: Normal breath sounds. No stridor. No wheezing.  Chest:     Chest wall: Tenderness present.    Abdominal:     General: There is no distension.     Palpations: Abdomen is soft.     Tenderness: There is no abdominal tenderness.  Musculoskeletal:     Cervical back: Normal range of motion and neck supple. No bony tenderness.     Thoracic back: Tenderness present. No bony tenderness.     Lumbar back: No bony tenderness.       Back:  Skin:    General: Skin is warm and dry.     Findings: No erythema or rash.  Neurological:     Mental Status: She is alert and oriented to person, place, and time.     Comments: Moving all extremities     ED Results and Treatments Labs (all labs ordered are listed, but only abnormal results are displayed) Labs Reviewed  CBC WITH DIFFERENTIAL/PLATELET - Abnormal; Notable for the following components:      Result Value   WBC 14.9 (*)    Neutro Abs 12.1 (*)    Abs Immature Granulocytes 0.12 (*)    All other components within normal limits  COMPREHENSIVE METABOLIC PANEL WITH GFR - Abnormal; Notable for the following components:   Potassium 3.3 (*)    Glucose, Bld 100 (*)    All other components within normal limits  ETHANOL - Abnormal; Notable for the following components:   Alcohol, Ethyl (B) 157 (*)    All other components within normal limits  I-STAT CHEM 8, ED - Abnormal; Notable for the following components:   Potassium 3.2 (*)    Glucose, Bld 106 (*)    Calcium, Ion 1.12 (*)    Hemoglobin 15.6 (*)    All other components within normal limits                                                                                                                         EKG  EKG Interpretation Date/Time:  Thursday September 18 2023 04:40:04 EDT Ventricular Rate:  74 PR Interval:  136 QRS Duration:  89 QT Interval:  387 QTC Calculation: 430 R Axis:   -30  Text Interpretation: Sinus rhythm LVH with secondary  repolarization abnormality Anterior Q waves, possibly due to LVH Confirmed by Drema Pry [phone]) on 09/18/2023 5:29:10 AM       Radiology CT CHEST ABDOMEN PELVIS W CONTRAST Result Date: 09/18/2023 CLINICAL DATA:  78 year old female status post fall while walking to bathroom. Right rib pain. EXAM: CT CHEST, ABDOMEN, AND PELVIS WITH CONTRAST TECHNIQUE: Multidetector CT imaging of the chest, abdomen and pelvis was performed following the standard protocol during bolus administration of intravenous contrast. RADIATION DOSE REDUCTION: This exam was  performed according to the departmental dose-optimization program which includes automated exposure control, adjustment of the mA and/or kV according to patient size and/or use of iterative reconstruction technique. CONTRAST:  OMNIPAQUE IOHEXOL 300 MG/ML  SOLN COMPARISON:  CTA chest 04/16/2023 and earlier. FINDINGS: CT CHEST FINDINGS Cardiovascular: Calcified aortic atherosclerosis. Calcified coronary artery atherosclerosis. Heart size remains normal. No pericardial effusion. Mediastinal vascular structures appear to remain intact. Mediastinum/Nodes: Negative. No mediastinal hematoma, gas, mediastinal mass or lymphadenopathy. Lungs/Pleura: Small to moderate right pneumothorax most apparent at the anterior costophrenic angle series 8, image 83. Major airways remain patent. Mild dependent atelectasis in both lungs. Asymmetric patchy right perihilar and peripheral right middle lobe lung opacity indeterminate for atelectasis versus contusion. Trace layering right pleural fluid. Subtle centrilobular emphysema suspected. No left-side pneumothorax, pleural effusion, or pulmonary contusion. Musculoskeletal: Displaced right lateral 7th rib fracture. Comminuted and severely displaced posterior right 8th rib fracture, with fracture fragments displaced greater than 1 cm. And a separate fracture of the lateral right 8th rib. See series 8, images 75 through 101. Comminuted  right posterior 9th rib fracture. Separate fracture of the lateral 9th rib. Mildly displaced posterior right 10th rib fracture. Associated moderate to large volume of posttraumatic subcutaneous emphysema along the right posterolateral chest wall and flank. The visible shoulder osseous structures appear intact and aligned with chronic glenohumeral degeneration. No sternal fracture identified. Multilevel chronic but no acute left-sided rib fractures identified. Previously augmented chronic T12 compression fracture. No acute thoracic vertebral fracture identified. CT ABDOMEN PELVIS FINDINGS Hepatobiliary: Adjacent rib fractures, abnormal right lung base, and lower chest and abdominal wall posttraumatic subcutaneous gas but the liver appears to remain intact. No perihepatic fluid. 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 Pancreas: Negative. Spleen: Spleen appears intact.  No perisplenic fluid. Adrenals/Urinary Tract: Normal adrenal glands. Kidneys appears symmetric and intact with symmetric renal enhancement and contrast excretion. Unremarkable bladder. Incidental pelvic phleboliths. Stomach/Bowel: Nondilated large and small bowel with redundant large bowel intermittently containing gas. Cecum on a lax mesentery in the anterior abdomen with normal appendix on coronal image 105. No pneumoperitoneum. No free fluid. Small volume retained fluid in the stomach and proximal duodenum. Vascular/Lymphatic: Aortoiliac calcified atherosclerosis. Normal caliber abdominal aorta with tortuosity. Major arterial structures in the abdomen and pelvis remain patent. Portal venous system is patent. No lymphadenopathy identified. Reproductive: Absent uterus. Diminutive ovaries are within normal limits. Other: No pelvis free fluid. Musculoskeletal: Augmented T12 compression fracture and lumbar vertebrae appear stable since a 2023 CT Abdomen and Pelvis. Chronic lumbar degenerative spondylolisthesis and vacuum disc. No lumbar, sacral,  pelvic, or proximal femur fracture identified. And no posttraumatic soft tissue gas below the L2 spinous process. IMPRESSION: 1. Positive for Acute, displaced fractures of the right ribs 7 through 10. Severely displaced right 8th rib fracture, and the right 8th and 9th ribs both fractured in two places. 2. Associated small to moderate Right Pneumothorax. Trace right hemothorax. Right perihilar and middle lobe lung opacity indeterminate for pulmonary contusion versus atelectasis. 3. Associated posttraumatic subcutaneous emphysema along the right chest wall and flank. 4. No other acute traumatic injury identified in the chest, abdomen, or pelvis. 5.  Aortic Atherosclerosis (ICD10-I70.0). Electronically Signed   By: Odessa Fleming M.D.   On: 09/18/2023 04:58   CT Cervical Spine Wo Contrast Result Date: 09/18/2023 CLINICAL DATA:  78 year old female status post fall while walking to bathroom. Right rib pain. EXAM: CT CERVICAL SPINE WITHOUT CONTRAST TECHNIQUE: Multidetector CT imaging of the cervical spine was performed without intravenous contrast.  Multiplanar CT image reconstructions were also generated. RADIATION DOSE REDUCTION: This exam was performed according to the departmental dose-optimization program which includes automated exposure control, adjustment of the mA and/or kV according to patient size and/or use of iterative reconstruction technique. COMPARISON:  Head and chest CT today.  Neck MRA 12/12/2021. FINDINGS: Alignment: Straightening of cervical lordosis with mild degenerative appearing anterolisthesis of C4 on C5 (2 mm). Cervicomedullary junction is within normal limits. Bilateral posterior element alignment is within normal limits. Skull base and vertebrae: Bone mineralization is within normal limits for age. Visualized skull base is intact. No atlanto-occipital dissociation. C1 and C2 appear chronically degenerated but intact and aligned. No acute osseous abnormality identified in the cervical spine. Soft  tissues and spinal canal: No prevertebral fluid or swelling. No visible canal hematoma. Necklace artifact. Retropharyngeal course of both carotids. Posttraumatic subcutaneous emphysema along the right posterolateral neck tracking to the right chest and shoulder. 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 Bulky and partially calcified ligamentous hypertrophy about the odontoid. However, capacious CT appearance of the cervical spinal canal. Upper chest: Abnormal, see dedicated chest CT reported separately. IMPRESSION: 1. No acute traumatic injury identified in the cervical spine. 2. Posttraumatic subcutaneous emphysema appears to be tracking up from the chest into the right posterolateral neck. See Chest CT reported separately. 3. 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 Electronically Signed   By: Odessa Fleming M.D.   On: 09/18/2023 04:45   CT Head Wo Contrast Result Date: 09/18/2023 CLINICAL DATA:  78 year old female status post fall while walking to bathroom. Right rib pain. EXAM: CT HEAD WITHOUT CONTRAST TECHNIQUE: Contiguous axial images were obtained from the base of the skull through the vertex without intravenous contrast. RADIATION DOSE REDUCTION: This exam was performed according to the departmental dose-optimization program which includes automated exposure control, adjustment of the mA and/or kV according to patient size and/or use of iterative reconstruction technique. COMPARISON:  Brain MRI 12/12/2021.  Head CT 11/01/2019. FINDINGS: Brain: Stable cerebral volume. No midline shift, ventriculomegaly, mass effect, evidence of mass lesion, intracranial hemorrhage or evidence of cortically based acute infarction. Gray-white differentiation is stable and within normal limits for age. Vascular: Calcified atherosclerosis at the skull base. No suspicious intracranial vascular hyperdensity.  Skull: Stable and intact.  No acute osseous abnormality identified. Sinuses/Orbits: Visualized paranasal sinuses and mastoids are stable and well aerated. Other: No acute orbit or scalp soft tissue injury identified. IMPRESSION: 1.  No acute traumatic injury identified. 2. Negative for age noncontrast CT appearance of the brain. Electronically Signed   By: Odessa Fleming M.D.   On: 09/18/2023 04:42    Medications Ordered in ED Medications  HYDROmorphone (DILAUDID) injection 1 mg (has no administration in time range)  methocarbamol (ROBAXIN) injection 500 mg (has no administration in time range)  iohexol (OMNIPAQUE) 300 MG/ML solution 100 mL (100 mLs Intravenous Contrast Given 09/18/23 0357)   Procedures .Critical Care  Performed by: Nira Conn, MD Authorized by: Nira Conn, MD   Critical care provider statement:    Critical care time (minutes):  45   Critical care time was exclusive of:  Separately billable procedures and treating other patients   Critical care was necessary to treat or prevent imminent or life-threatening deterioration of the following conditions:  Trauma   Critical care was time spent personally by me on the following activities:  Development of treatment plan with patient or surrogate,  discussions with consultants, evaluation of patient's response to treatment, examination of patient, obtaining history from patient or surrogate, review of old charts, re-evaluation of patient's condition, pulse oximetry, ordering and review of radiographic studies, ordering and review of laboratory studies and ordering and performing treatments and interventions   Care discussed with: admitting provider     (including critical care time) Medical Decision Making / ED Course   Medical Decision Making Amount and/or Complexity of Data Reviewed Labs: ordered. Decision-making details documented in ED Course. Radiology: ordered and independent interpretation performed.  Decision-making details documented in ED Course. ECG/medicine tests: ordered and independent interpretation performed. Decision-making details documented in ED Course.  Risk OTC drugs. Prescription drug management. Parenteral controlled substances. Decision regarding hospitalization.    Mechanical fall resulting in right lateral chest wall pain.  Noted contusion and subcutaneous crepitus.  Lungs were clear to auscultation bilaterally but given the crepitus, I have a concern for traumatic pneumothorax and likely rib fractures.  Given reported EtOH use, CT head and cervical spine ordered to assess for intracranial or cervical injuries.  No other injuries noted on exam requiring other targeted imaging.  Provided with IV fluids and pain medicine.  Clinical Course as of 09/18/23 0804  Thu Sep 18, 2023  0506 CT head and cervical spine negative for any acute injuries.  CT of the chest abdomen pelvis confirmed multiple right posterior rib fractures with mild to moderate pneumothorax with likely compression atelectasis.  No other acute injuries noted.  This was confirmed by radiology.  Patient is satting well on room air.  Likely will not require a chest tube.  Will discuss case with surgery regarding admission for pain control and close monitoring. [PC]  0523 I spoke with Dr. Hershell Lose who will coordinate evaluation and transfer to [facility]. [PC]    Clinical Course User Index [PC] Lindle Rhea, MD    Final Clinical Impression(s) / ED Diagnoses Final diagnoses:  Closed traumatic fracture of ribs of right side with pneumothorax  Alcoholic intoxication without complication [facility])    This chart was dictated using voice recognition software.  Despite best efforts to proofread,  errors can occur which can change the documentation meaning.    Lindle Rhea, MD 09/18/23 [phone]

## 2023-09-18 NOTE — ED Notes (Signed)
 Pt has Rx due and I notified pharmacy I needed those verified so I could give.  Also, pt stated to me her family would bring her meds from home, I ask her not to take anything from home unless we were made aware so Dr could be aware.

## 2023-09-18 NOTE — ED Notes (Signed)
 Patient was transferred [facility] to [facility].

## 2023-09-18 NOTE — Progress Notes (Signed)
 Echocardiogram 2D Echocardiogram has been performed.  Emmaline Haring Rich Paprocki RDCS 09/18/2023, 3:26 PM

## 2023-09-18 NOTE — Evaluation (Signed)
 Physical Therapy Evaluation Patient Details Name: Ana Johnston MRN: [id number] DOB: [birth year] Today's Date: 09/18/2023  History of Present Illness  78 y.o. female admitted with right posterior chest wall pain following a mechanical fall at home. Dx of R 7-10 rib fractures, pneumothorax. PMH: HTN, COPD, osteopenia, spinal stenosis  Clinical Impression  Pt admitted with above diagnosis. Pt ambulated 70' with RW without loss of balance, distance limited by pain. SpO2 96% on room air ~1 minute after walking, no dyspnea noted. Pt reports her son and daughter in law can provide needed level of assistance at home.  Pt currently with functional limitations due to the deficits listed below (see PT Problem List). Pt will benefit from acute skilled PT to increase their independence and safety with mobility to allow discharge.           If plan is discharge home, recommend the following: A little help with bathing/dressing/bathroom;Assistance with cooking/housework;Assist for transportation;Help with stairs or ramp for entrance   Can travel by private vehicle        Equipment Recommendations Rolling walker (2 wheels)  Recommendations for Other Services       Functional Status Assessment Patient has had a recent decline in their functional status and demonstrates the ability to make significant improvements in function in a reasonable and predictable amount of time.     Precautions / Restrictions Precautions Precautions: Other (comment) Recall of Precautions/Restrictions: Intact Precaution/Restrictions Comments: R 7-10 rib fxs Restrictions Weight Bearing Restrictions Per Provider Order: No      Mobility  Bed Mobility Overal bed mobility: Needs Assistance Bed Mobility: Rolling, Sidelying to Sit, Sit to Sidelying Rolling: Min assist Sidelying to sit: Min assist     Sit to sidelying: Min assist General bed mobility comments: VCs for log roll to L, min A to raise trunk, min A for BLEs  into bed    Transfers Overall transfer level: Needs assistance Equipment used: Rolling walker (2 wheels) Transfers: Sit to/from Stand Sit to Stand: Contact guard assist, From elevated surface           General transfer comment: VCs hand placement    Ambulation/Gait Ambulation/Gait assistance: Contact guard assist Gait Distance (Feet): 70 Feet Assistive device: Rolling walker (2 wheels) Gait Pattern/deviations: Step-through pattern, Decreased stride length Gait velocity: decr     General Gait Details: steady, distance limited by pain, SpO2 96% on room air ~1 minute after walking, no loss of balance, no dyspena  Stairs            Wheelchair Mobility     Tilt Bed    Modified Rankin (Stroke Patients Only)       Balance Overall balance assessment: History of Falls, Needs assistance Sitting-balance support: Feet unsupported, No upper extremity supported Sitting balance-Leahy Scale: Good     Standing balance support: Bilateral upper extremity supported, During functional activity, Reliant on assistive device for balance Standing balance-Leahy Scale: Fair Standing balance comment: no LOB with walking with RW                             Pertinent Vitals/Pain Pain Assessment Pain Assessment: 0-10 Pain Score: 8  Pain Location: R lateral chest especially with breathing Pain Descriptors / Indicators: Spasm, Sore    Home Living Family/patient expects to be discharged to:: Private residence Living Arrangements: Alone Available Help at Discharge: Family;Available PRN/intermittently   Home Access: Stairs to enter Entrance Stairs-Rails: Can reach both;Left;Right Entrance Stairs-Number of  Steps: 3 Alternate Level Stairs-Number of Steps: flight Home Layout: Two level;Able to live on main level with bedroom/bathroom Home Equipment: BSC/3in1;Shower seat;Transport chair Additional Comments: pt stated son/daughter in law live nearby and could stay with her     Prior Function Prior Level of Function : Independent/Modified Independent             Mobility Comments: walks without AD; 2 falls in past 6 months (including this one; get home to her dog ADLs Comments: independent     Extremity/Trunk Assessment   Upper Extremity Assessment Upper Extremity Assessment: Defer to OT evaluation    Lower Extremity Assessment Lower Extremity Assessment: Overall WFL for tasks assessed    Cervical / Trunk Assessment Cervical / Trunk Assessment: Normal  Communication   Communication Communication: No apparent difficulties    Cognition Arousal: Alert Behavior During Therapy: WFL for tasks assessed/performed   PT - Cognitive impairments: No apparent impairments                         Following commands: Intact       Cueing Cueing Techniques: Verbal cues     General Comments      Exercises     Assessment/Plan    PT Assessment Patient needs continued PT services  PT Problem List Decreased activity tolerance;Decreased balance;Pain;Decreased mobility       PT Treatment Interventions Gait training;Therapeutic activities;Therapeutic exercise;Functional mobility training;Patient/family education    PT Goals (Current goals can be found in the Care Plan section)  Acute Rehab PT Goals Patient Stated Goal: return home to her dog PT Goal Formulation: With patient Time For Goal Achievement: 10/02/23 Potential to Achieve Goals: Good    Frequency Min 3X/week     Co-evaluation               AM-PAC PT "6 Clicks" Mobility  Outcome Measure Help needed turning from your back to your side while in a flat bed without using bedrails?: A Little Help needed moving from lying on your back to sitting on the side of a flat bed without using bedrails?: A Little Help needed moving to and from a bed to a chair (including a wheelchair)?: A Little Help needed standing up from a chair using your arms (e.g., wheelchair or bedside  chair)?: A Little Help needed to walk in hospital room?: A Little Help needed climbing 3-5 steps with a railing? : A Lot 6 Click Score: 17    End of Session Equipment Utilized During Treatment: Gait belt Activity Tolerance: Patient limited by pain Patient left: in bed;with call bell/phone within reach Nurse Communication: Patient requests pain meds;Mobility status PT Visit Diagnosis: Pain;History of falling (Z91.81);Difficulty in walking, not elsewhere classified (R26.2)    Time: 8657-8469 PT Time Calculation (min) (ACUTE ONLY): 28 min   Charges:   PT Evaluation $PT Eval Moderate Complexity: 1 Mod   PT General Charges $$ ACUTE PT VISIT: 1 Visit        Daymon Evans PT 09/18/2023  Acute Rehabilitation Services  Office [phone]

## 2023-09-18 NOTE — Consult Note (Signed)
 Ana Johnston  [birth year] [id number]  CARE TEAM:  PCP: Ana Leatherwood, DO  Outpatient Care Team: Patient Care Team: Ana Leatherwood, DO as PCP - General (Family Medicine) Ana Penta, MD as Referring Physician (Gastroenterology) Ana Gess, MD as Consulting Physician (Cardiology) Ana Johnston, Ana Batty, DO as Consulting Physician (Neurology)  Inpatient Treatment Team: Treatment Team:  Ana Conn, MD Ana Johnston, EMT Md, Trauma, MD   This patient is a 78 y.o.female who presents today for surgical evaluation at the request of Ana Johnston, .   Chief complaint / Reason for evaluation: Fall with broken ribs and pneumothorax  78 year old woman.  History of prior falls in the past. (ER visits for falls 02/01/2017, 05/25/2017, 10/31/2019, today 09/17/2022) recurring laceration repairs x 2.  Former smoker with some COPD.  Quit a few years ago.  Has some osteopenia.   Apparently had some glasses of alcohol with dinner.  Was going to the bathroom and fell against the toilet on her right side.  Felt pain and discomfort.  To [facility] emergency department.  Obvious crepitus felt along the right chest wall.  CT chest shows moderate anterior pneumothorax with posterior rib fractures.  Trauma consult requested  Prior history of pulmonary embolism 2023 x 3 months of anticoagulation.  Prior C. difficile diarrhea.  Hypertension.  Some coronary disease by screening scans but no history of myocardial infarction or atrial fibrillation.  Chronic cervical and low back pain.  In the process of seeing Ana. Shon Johnston with orthopedic spine surgery.  Patient failed for the [facility].  Followed by Ana. Gery Johnston with cardiology for some hypertension.  Had an RA echocardiogram in 2023 that showed normal LV systolic function with grade 1 diastolic dysfunction.  Send coronary disease by CT scoring.  I do not see any dysrhythmia documented or workup in the past.  Sounds like the patient gets  occasionally lightheaded when she gets up hence the 4 falls including this 1 last night   Assessment  Ana Johnston  78 y.o. female       Problem List:  Principal Problem:   Traumatic fracture of ribs 7-10 of right side with pneumothorax Active Problems:   Personal history of multiple falls   Essential hypertension   COPD (chronic obstructive pulmonary disease) (HCC)   Generalized anxiety disorder   Hyperlipidemia   Coronary artery disease   Osteopenia of multiple sites (-1.01-2020)   Fall at home   Pneumothorax, right   Former heavy cigarette smoker (20-39 per day) - quit 2021   History of pulmonary embolus (PE)   Recurrent episode of fall now with posterior rib fracture and pneumothorax.  Plan:  Needs admission.  Will discuss with trauma service if this is a internal medicine admission with trauma consultation or the other way around.  Supplemental oxygen.  Not needing more than 2 L so far  Follow with x-rays and exams.  Preliminary chest x-ray does not show any major obvious pneumothorax.  Needle pigtail chest tube if does not improve.  Pain control.  EKG shows no obvious dysrhythmia but I wonder if she needs to have telemetry or dysrhythmia, cardiology workup.  Defer to trauma/medicine.  With history of recurrent falls I think needs therapies & evaluation.  At least  home safety evaluation etc.  Need to try and break the cycle of falls since this is the fourth ED visit for a fall in seven years.  -monitor electrolytes & replace as needed  Keep K>4, Mg>2, Phos>3  -VTE prophylaxis- SCDs.  Anticoagulation prophyllaxis SQ as appropriate  -mobilize as tolerated to help recovery.  Enlist therapies in moderate/high risk patients as appropriate  I updated the patient's status to the patient, ER attending, Ana. Hillery Johnston covering trauma service.  Recommendations were made.  Questions were answered.  They expressed understanding & appreciation.  -Disposition:  Disposition:   The patient is from: Home Anticipate discharge to:  Skilled Nursing Facility (SNF) Anticipated Date of Discharge is:  April 19,2025   Barriers to discharge:  Pending Clinical improvement (more likely than not), Therapy assessment & Recommendations pending, Testing result pending, and Consultant clearance & sign off    Patient currently is NOT MEDICALLY STABLE for discharge from the hospital from a surgery standpoint.     I reviewed nursing notes, ED provider notes, last 24 h vitals and pain scores, last 48 h intake and output, last 24 h labs and trends, and last 24 h imaging results. I have reviewed this patient's available data, including medical history, events of note, test results, etc as part of my evaluation.  A significant portion of that time was spent in counseling.  Care during the described time interval was provided by me.  This care required moderate level of medical decision making.  09/18/2023  Ana Sportsman, MD, FACS, MASCRS Esophageal, Gastrointestinal & Colorectal Surgery Robotic and Minimally Invasive Surgery  [facility] [address] [place], [place] [address] [phone] Fax [phone] Main  CONTACT INFORMATION: Weekday (9AM-5PM): Call [facility] main office at [phone] Weeknight (5PM-9AM) or Weekend/Holiday: Check EPIC "Web Links" tab & use "AMION" (password " [email or web address]") for General Surgery [facility] coverage  Please, DO NOT use SecureChat  (it is not reliable communication to reach operating surgeons & will lead to a delay in care).   Epic staff messaging available for outptient concerns needing 1-2 business day response.      09/18/2023      Past Medical History:  Diagnosis Date   Acute pulmonary embolism without acute cor pulmonale (HCC) 05/17/2022   Formatting of this note might be different from the original. CTA of the Chest (05/13/2022): Small subsegmental right lower lobe pulmonary  embolus, likely subacute in age given its relatively peripheral location within the vessel.     Arthritis    Atypical chest pain 04/09/2022   Atypical chest pain     Clostridium difficile diarrhea 05/25/2022   Colitis    Depression    Diverticulitis    Dyspnea on exertion 04/09/2022   Dyspnea on exertion     History of Clostridioides difficile colitis 09/18/2023   Hypertension    Seasonal allergies     Past Surgical History:  Procedure Laterality Date   ABDOMINAL HYSTERECTOMY     BACK SURGERY  09/2016   CATARACT EXTRACTION, BILATERAL     CHOLECYSTECTOMY     1966   COLONOSCOPY  09/2022   TONSILLECTOMY AND ADENOIDECTOMY     1965    Social History   Socioeconomic History   Marital status: Widowed    Spouse name: Not on file   Number of children: Not on file   Years of education: Not on file   Highest education level: Not on file  Occupational History   Occupation: retired  Tobacco Use   Smoking status: Former    Types: Cigarettes    Passive exposure: Never   Smokeless tobacco: Never   Tobacco comments:  1/2 ppd x 50+ years  Vaping Use   Vaping status: Never Used  Substance and Sexual Activity   Alcohol use: Yes    Comment: 2 glasses of wine a day   Drug use: Never   Sexual activity: Not Currently    Partners: Male  Other Topics Concern   Not on file  Social History Narrative   Marital status/children/pets:  widowed, G2P2   Education/employment: 12th grade education, retired   Field seismologist:      -smoke alarm in the home:Yes     - wears seatbelt: Yes     - Feels safe in their relationships: Yes      Social Drivers of Corporate investment banker Strain: Low Risk  (05/10/2023)   Received from [facility]   Overall Financial Resource Strain (CARDIA)    Difficulty of Paying Living Expenses: Not hard at all  Food Insecurity: Unknown (05/10/2023)   Received from [facility]   Hunger Vital Sign    Worried About Running Out of Food in the Last Year: Never true     Ran Out of Food in the Last Year: Patient declined  Transportation Needs: Patient Declined (05/10/2023)   Received [facility] - Transportation    Lack of Transportation (Medical): Patient declined    Lack of Transportation (Non-Medical): Patient declined  Physical Activity: Unknown (05/10/2023)   Received from [facility]   Exercise Vital Sign    Days of Exercise per Week: Patient declined    Minutes of Exercise per Session: 0 min  Stress: Patient Declined (05/10/2023)   Received from [facility] - Occupational Stress Questionnaire    Feeling of Stress : Patient declined  Social Connections: Patient Declined (05/10/2023)   Received from [facility]   Social Network    How would you rate your social network (family, work, friends)?: Patient declined  Intimate Partner Violence: Patient Declined (05/10/2023)   Received from [facility]   HITS    Over the last 12 months how often did your partner physically hurt you?: Patient declined    Over the last 12 months how often did your partner insult you or talk down to you?: Patient declined    Over the last 12 months how often did your partner threaten you with physical harm?: Patient declined    Over the last 12 months how often did your partner scream or curse at you?: Patient declined    Family History  Problem Relation Age of Onset   Diabetes Mother    Heart attack Father    Hearing loss Sister    Breast cancer Sister    Hyperlipidemia Brother    Heart attack Brother    Hearing loss Brother    Hypertension Son    Hyperlipidemia Son    Alcohol abuse Son     Current Facility-Administered Medications  Medication Dose Route Frequency Provider Last Rate Last Admin   acetaminophen (TYLENOL) tablet 1,000 mg  1,000 mg Oral Q6H PRN Karie Soda, MD       albuterol (PROVENTIL) (2.5 MG/3ML) 0.083% nebulizer solution 3 mL  3 mL Inhalation Q6H PRN Karie Soda, MD        atorvastatin (LIPITOR) tablet 20 mg  20 mg Oral Daily Shelbi Vaccaro, Viviann Spare, MD       bisacodyl (DULCOLAX) suppository 10 mg  10 mg Rectal Q12H PRN Karie Soda, MD       budeson-glycopyrrolate-formoterol (BREZTRI) 160-9-4.8 MCG/ACT inhaler  2 puff  2 puff Inhalation BID Candyce Champagne, MD       budesonide (ENTOCORT EC) 24 hr capsule 6 mg  6 mg Oral Daily Candyce Champagne, MD       busPIRone (BUSPAR) tablet 5 mg  5 mg Oral BID Candyce Champagne, MD       calcium-vitamin D (OSCAL WITH D) 500-200 MG-UNIT per tablet 1 tablet  1 tablet Oral Q breakfast Candyce Champagne, MD       cyanocobalamin (VITAMIN B12) tablet 1,000 mcg  1,000 mcg Oral Daily Candyce Champagne, MD       diphenhydrAMINE (BENADRYL) injection 12.5-25 mg  12.5-25 mg Intravenous Q6H PRN Candyce Champagne, MD       hydrALAZINE (APRESOLINE) injection 5-10 mg  5-10 mg Intravenous Q4H PRN Candyce Champagne, MD       HYDROmorphone (DILAUDID) injection 0.5-1 mg  0.5-1 mg Intravenous Q2H PRN Candyce Champagne, MD       lactated ringers bolus 1,000 mL  1,000 mL Intravenous Q8H PRN Candyce Champagne, MD       levocetirizine (XYZAL) tablet 5 mg  5 mg Oral QPM Candyce Champagne, MD       magic mouthwash  15 mL Oral QID PRN Candyce Champagne, MD       magnesium tablet 30 mg  30 mg Oral Daily Candyce Champagne, MD       menthol-cetylpyridinium (CEPACOL) lozenge 3 mg  1 lozenge Oral PRN Candyce Champagne, MD       methocarbamol (ROBAXIN) injection 500 mg  500 mg Intramuscular Once Cardama, Pedro Eduardo, MD       metoprolol tartrate (LOPRESSOR) injection 5 mg  5 mg Intravenous Q6H PRN Candyce Champagne, MD       montelukast (SINGULAIR) tablet 10 mg  10 mg Oral Daily Candyce Champagne, MD       naphazoline-glycerin (CLEAR EYES REDNESS) ophth solution 1-2 drop  1-2 drop Both Eyes QID PRN Candyce Champagne, MD       naproxen (NAPROSYN) tablet 375 mg  375 mg Oral TID WC Candyce Champagne, MD       ondansetron (ZOFRAN) injection 4 mg  4 mg Intravenous Q6H PRN Candyce Champagne, MD       oxyCODONE (Oxy IR/ROXICODONE)  immediate release tablet 5-10 mg  5-10 mg Oral Q4H PRN Candyce Champagne, MD       phenol (CHLORASEPTIC) mouth spray 2 spray  2 spray Mouth/Throat PRN Candyce Champagne, MD       polycarbophil (FIBERCON) tablet 625 mg  625 mg Oral BID Candyce Champagne, MD       sertraline (ZOLOFT) tablet 50 mg  50 mg Oral Daily Candyce Champagne, MD       sodium chloride (OCEAN) 0.65 % nasal spray 1-2 spray  1-2 spray Each Nare Q6H PRN Candyce Champagne, MD       tiZANidine (ZANAFLEX) tablet 2-4 mg  2-4 mg Oral Q8H PRN Candyce Champagne, MD       Current Outpatient Medications  Medication Sig Dispense Refill   albuterol (VENTOLIN HFA) 108 (90 Base) MCG/ACT inhaler Inhale 2 puffs into the lungs every 6 (six) hours as needed for wheezing or shortness of breath. (Patient not taking: Reported on 07/09/2023) 18 g 11   amLODipine (NORVASC) 5 MG tablet Take 1 tablet (5 mg total) by mouth daily. 90 tablet 1   atorvastatin (LIPITOR) 20 MG tablet Take 1 tablet (20 mg total) by mouth daily. 90 tablet 3   budesonide (ENTOCORT EC) 3 MG 24 hr capsule  Take 6 mg by mouth daily.     busPIRone (BUSPAR) 5 MG tablet Take 1 tablet (5 mg total) by mouth 2 (two) times daily. 180 tablet 1   calcium-vitamin D (OSCAL WITH D) 500-200 MG-UNIT tablet Take 1 tablet by mouth daily with breakfast.     Cholecalciferol (VITAMIN D3) 1000 units CHEW Chew 1 tablet by mouth daily. (Patient not taking: Reported on 07/09/2023)     Cyanocobalamin (VITAMIN B-12 PO) Take 1 capsule by mouth daily.     hydrochlorothiazide (MICROZIDE) 12.5 MG capsule Take 1 capsule (12.5 mg total) by mouth as needed. 90 capsule 1   magnesium 30 MG tablet Take 30 mg by mouth daily.     Melatonin 10 MG TABS Take 10 mg by mouth as needed.     meloxicam (MOBIC) 15 MG tablet Take 1 tablet (15 mg total) by mouth daily. 90 tablet 1   montelukast (SINGULAIR) 10 MG tablet Take 1 tablet (10 mg total) by mouth daily. 90 tablet 1   predniSONE (DELTASONE) 20 MG tablet 60 mg x1d, 40 mg x3d, 20 mg x2d, 10 mg x2d  (Patient not taking: Reported on 07/09/2023) 12 tablet 0   sertraline (ZOLOFT) 50 MG tablet Take 1 tablet (50 mg total) by mouth daily. 90 tablet 1   tiZANidine (ZANAFLEX) 4 MG tablet Take 0.5-1 tablets (2-4 mg total) by mouth every 8 (eight) hours as needed for muscle spasms. 60 tablet 0   TRELEGY ELLIPTA 200-62.5-25 MCG/ACT AEPB Inhale 1 Inhalation into the lungs daily. 60 each 11   XYZAL ALLERGY 24HR 5 MG tablet Take 1 tablet (5 mg total) by mouth every evening. 90 tablet 3     No Known Allergies  ROS:   All other systems reviewed & are negative except per HPI or as noted below: Constitutional:  No fevers, chills, sweats.  Weight stable Eyes:  No vision changes, No discharge HENT:  No sore throats, nasal drainage Lymph: No neck swelling, No bruising easily Pulmonary:  No cough, productive sputum CV: No orthopnea, PND  Patient walks  10 minutes without difficulty.  No exertional chest/neck/shoulder/arm pain.  GI: No personal nor family history of GI/colon cancer, inflammatory bowel disease, irritable bowel syndrome, allergy such as Celiac Sprue, dietary/dairy problems, colitis, ulcers nor gastritis.  No recent sick contacts/gastroenteritis.  No travel outside the country.  No changes in diet.  Renal: No UTIs, No hematuria Genital:  No drainage, bleeding, masses Musculoskeletal: No severe joint pain.  Good ROM major joints Skin:  No sores or lesions Heme/Lymph:  No easy bleeding.  No swollen lymph nodes   BP 105/61 (BP Location: Right Arm)   Pulse 73   Temp 98.1 F (36.7 C) (Oral)   Resp 16   Ht 5' (1.524 m)   Wt 66.7 kg   SpO2 100%   BMI 28.71 kg/m   Physical Exam:  Constitutional: Not cachectic.  Hygeine adequate.  Vitals signs as above.   Eyes: Pupils reactive, normal extraocular movements. Sclera nonicteric Neuro: CN II-XII intact.  No major focal sensory defects.  No major motor deficits. Lymph: No head/neck/groin lymphadenopathy Psych:  No severe agitation.  No severe  anxiety.  Judgment & insight Adequate, Oriented x4, HENT: Normocephalic, Mucus membranes moist.  No thrush.   Neck: Supple, No tracheal deviation.  No obvious thyromegaly  Chest: No conversational dyspnea with splinting.  Obvious right lateral and posterior chest wall pain with crepitus.  Mildly decreased respirations on right side but decent excursion.  No  audible wheezing  CV:  Pulses intact.  regular rhythm.  No major extremity edema  Abdomen:  Flat Hernia: Not present. Diastasis recti: Not present. Soft.   Nondistended.  Nontender.  No hepatomegaly.  No splenomegaly  Gen:  Inguinal hernia: Not present.  Inguinal lymph nodes: without lymphadenopathy.    Rectal: Pelvis stable.  No obvious rectal bleeding or injury externally.  No incontinence to stool or urine  Ext: No obvious deformity or contracture.  No swelling or pain on wrist/elbow/shoulders hips/knees/ankles.  Normal active and passive range of motion.  Edema: Not present.  No cyanosis Skin: No major subcutaneous nodules.  Warm and dry Musculoskeletal: Severe joint rigidity not present.  No obvious clubbing.  No digital petechiae.     Results:   Labs: Results for orders placed or performed during the hospital encounter of 09/18/23 (from the past 48 hours)  CBC with Differential     Status: Abnormal   Collection Time: 09/18/23  2:52 AM  Result Value Ref Range   WBC 14.9 (H) 4.0 - 10.5 K/uL   RBC 4.71 3.87 - 5.11 MIL/uL   Hemoglobin 14.8 12.0 - 15.0 g/dL   HCT 01.0 27.2 - 53.6 %   MCV 96.4 80.0 - 100.0 fL   MCH 31.4 26.0 - 34.0 pg   MCHC 32.6 30.0 - 36.0 g/dL   RDW 64.4 03.4 - 74.2 %   Platelets 317 150 - 400 K/uL   nRBC 0.0 0.0 - 0.2 %   Neutrophils Relative % 81 %   Neutro Abs 12.1 (H) 1.7 - 7.7 K/uL   Lymphocytes Relative 13 %   Lymphs Abs 1.9 0.7 - 4.0 K/uL   Monocytes Relative 4 %   Monocytes Absolute 0.7 0.1 - 1.0 K/uL   Eosinophils Relative 0 %   Eosinophils Absolute 0.0 0.0 - 0.5 K/uL   Basophils Relative 1  %   Basophils Absolute 0.1 0.0 - 0.1 K/uL   Immature Granulocytes 1 %   Abs Immature Granulocytes 0.12 (H) 0.00 - 0.07 K/uL    Comment: Performed at [facility], [address], [place], [place] [address]  Comprehensive metabolic panel     Status: Abnormal   Collection Time: 09/18/23  2:52 AM  Result Value Ref Range   Sodium 135 135 - 145 mmol/L   Potassium 3.3 (L) 3.5 - 5.1 mmol/L   Chloride 98 98 - 111 mmol/L   CO2 23 22 - 32 mmol/L   Glucose, Bld 100 (H) 70 - 99 mg/dL    Comment: Glucose reference range applies only to samples taken after fasting for at least 8 hours.   BUN 14 8 - 23 mg/dL   Creatinine, Ser 8.75 0.44 - 1.00 mg/dL   Calcium 9.3 8.9 - 64.3 mg/dL   Total Protein 7.1 6.5 - 8.1 g/dL   Albumin 3.7 3.5 - 5.0 g/dL   AST 36 15 - 41 U/L   ALT 30 0 - 44 U/L   Alkaline Phosphatase 82 38 - 126 U/L   Total Bilirubin 0.7 0.0 - 1.2 mg/dL   GFR, Estimated >32 >95 mL/min    Comment: (NOTE) Calculated using the CKD-EPI Creatinine Equation (2021)    Anion gap 14 5 - 15    Comment: Performed at [facility], [address] [place], [place] [address]  Ethanol     Status: Abnormal   Collection Time: 09/18/23  2:52 AM  Result Value Ref Range   Alcohol, Ethyl (B) 157 (H) <  10 mg/dL    Comment: (NOTE) For medical purposes only. Performed at [facility], [address] [place], [place] [address]   I-stat chem 8, ED (not at [facility], [facility] or [facility])     Status: Abnormal   Collection Time: 09/18/23  3:02 AM  Result Value Ref Range   Sodium 135 135 - 145 mmol/L   Potassium 3.2 (L) 3.5 - 5.1 mmol/L   Chloride 99 98 - 111 mmol/L   BUN 13 8 - 23 mg/dL   Creatinine, Ser 6.04 0.44 - 1.00 mg/dL   Glucose, Bld 540 (H) 70 - 99 mg/dL    Comment: Glucose reference range applies only to samples taken after fasting for at least 8 hours.   Calcium, Ion 1.12 (L) 1.15 - 1.40 mmol/L   TCO2 23 22 - 32 mmol/L   Hemoglobin 15.6 (H) 12.0 - 15.0  g/dL   HCT 98.1 19.1 - 47.8 %    Imaging / Studies: CT CHEST ABDOMEN PELVIS W CONTRAST Result Date: 09/18/2023 CLINICAL DATA:  78 year old female status post fall while walking to bathroom. Right rib pain. EXAM: CT CHEST, ABDOMEN, AND PELVIS WITH CONTRAST TECHNIQUE: Multidetector CT imaging of the chest, abdomen and pelvis was performed following the standard protocol during bolus administration of intravenous contrast. RADIATION DOSE REDUCTION: This exam was performed according to the departmental dose-optimization program which includes automated exposure control, adjustment of the mA and/or kV according to patient size and/or use of iterative reconstruction technique. CONTRAST:  OMNIPAQUE IOHEXOL 300 MG/ML  SOLN COMPARISON:  CTA chest 04/16/2023 and earlier. FINDINGS: CT CHEST FINDINGS Cardiovascular: Calcified aortic atherosclerosis. Calcified coronary artery atherosclerosis. Heart size remains normal. No pericardial effusion. Mediastinal vascular structures appear to remain intact. Mediastinum/Nodes: Negative. No mediastinal hematoma, gas, mediastinal mass or lymphadenopathy. Lungs/Pleura: Small to moderate right pneumothorax most apparent at the anterior costophrenic angle series 8, image 83. Major airways remain patent. Mild dependent atelectasis in both lungs. Asymmetric patchy right perihilar and peripheral right middle lobe lung opacity indeterminate for atelectasis versus contusion. Trace layering right pleural fluid. Subtle centrilobular emphysema suspected. No left-side pneumothorax, pleural effusion, or pulmonary contusion. Musculoskeletal: Displaced right lateral 7th rib fracture. Comminuted and severely displaced posterior right 8th rib fracture, with fracture fragments displaced greater than 1 cm. And a separate fracture of the lateral right 8th rib. See series 8, images 75 through 101. Comminuted right posterior 9th rib fracture. Separate fracture of the lateral 9th rib. Mildly  displaced posterior right 10th rib fracture. Associated moderate to large volume of posttraumatic subcutaneous emphysema along the right posterolateral chest wall and flank. The visible shoulder osseous structures appear intact and aligned with chronic glenohumeral degeneration. No sternal fracture identified. Multilevel chronic but no acute left-sided rib fractures identified. Previously augmented chronic T12 compression fracture. No acute thoracic vertebral fracture identified. CT ABDOMEN PELVIS FINDINGS Hepatobiliary: Adjacent rib fractures, abnormal right lung base, and lower chest and abdominal wall posttraumatic subcutaneous gas but the liver appears to remain intact. No perihepatic fluid. 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 Pancreas: Negative. Spleen: Spleen appears intact.  No perisplenic fluid. Adrenals/Urinary Tract: Normal adrenal glands. Kidneys appears symmetric and intact with symmetric renal enhancement and contrast excretion. Unremarkable bladder. Incidental pelvic phleboliths. Stomach/Bowel: Nondilated large and small bowel with redundant large bowel intermittently containing gas. Cecum on a lax mesentery in the anterior abdomen with normal appendix on coronal image 105. No pneumoperitoneum. No free fluid. Small volume retained fluid in the stomach and proximal duodenum. Vascular/Lymphatic: Aortoiliac  calcified atherosclerosis. Normal caliber abdominal aorta with tortuosity. Major arterial structures in the abdomen and pelvis remain patent. Portal venous system is patent. No lymphadenopathy identified. Reproductive: Absent uterus. Diminutive ovaries are within normal limits. Other: No pelvis free fluid. Musculoskeletal: Augmented T12 compression fracture and lumbar vertebrae appear stable since a 2023 CT Abdomen and Pelvis. Chronic lumbar degenerative spondylolisthesis and vacuum disc. No lumbar, sacral, pelvic, or proximal femur fracture identified. And no posttraumatic soft tissue gas  below the L2 spinous process. IMPRESSION: 1. Positive for Acute, displaced fractures of the right ribs 7 through 10. Severely displaced right 8th rib fracture, and the right 8th and 9th ribs both fractured in two places. 2. Associated small to moderate Right Pneumothorax. Trace right hemothorax. Right perihilar and middle lobe lung opacity indeterminate for pulmonary contusion versus atelectasis. 3. Associated posttraumatic subcutaneous emphysema along the right chest wall and flank. 4. No other acute traumatic injury identified in the chest, abdomen, or pelvis. 5.  Aortic Atherosclerosis (ICD10-I70.0). Electronically Signed   By: Odessa Fleming M.D.   On: 09/18/2023 04:58   CT Cervical Spine Wo Contrast Result Date: 09/18/2023 CLINICAL DATA:  78 year old female status post fall while walking to bathroom. Right rib pain. EXAM: CT CERVICAL SPINE WITHOUT CONTRAST TECHNIQUE: Multidetector CT imaging of the cervical spine was performed without intravenous contrast. Multiplanar CT image reconstructions were also generated. RADIATION DOSE REDUCTION: This exam was performed according to the departmental dose-optimization program which includes automated exposure control, adjustment of the mA and/or kV according to patient size and/or use of iterative reconstruction technique. COMPARISON:  Head and chest CT today.  Neck MRA 12/12/2021. FINDINGS: Alignment: Straightening of cervical lordosis with mild degenerative appearing anterolisthesis of C4 on C5 (2 mm). Cervicomedullary junction is within normal limits. Bilateral posterior element alignment is within normal limits. Skull base and vertebrae: Bone mineralization is within normal limits for age. Visualized skull base is intact. No atlanto-occipital dissociation. C1 and C2 appear chronically degenerated but intact and aligned. No acute osseous abnormality identified in the cervical spine. Soft tissues and spinal canal: No prevertebral fluid or swelling. No visible canal  hematoma. Necklace artifact. Retropharyngeal course of both carotids. Posttraumatic subcutaneous emphysema along the right posterolateral neck tracking to the right chest and shoulder. 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 Bulky and partially calcified ligamentous hypertrophy about the odontoid. However, capacious CT appearance of the cervical spinal canal. Upper chest: Abnormal, see dedicated chest CT reported separately. IMPRESSION: 1. No acute traumatic injury identified in the cervical spine. 2. Posttraumatic subcutaneous emphysema appears to be tracking up from the chest into the right posterolateral neck. See Chest CT reported separately. 3. 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 Electronically Signed   By: Odessa Fleming M.D.   On: 09/18/2023 04:45   CT Head Wo Contrast Result Date: 09/18/2023 CLINICAL DATA:  78 year old female status post fall while walking to bathroom. Right rib pain. EXAM: CT HEAD WITHOUT CONTRAST TECHNIQUE: Contiguous axial images were obtained from the base of the skull through the vertex without intravenous contrast. RADIATION DOSE REDUCTION: This exam was performed according to the departmental dose-optimization program which includes automated exposure control, adjustment of the mA and/or kV according to patient size and/or use of iterative reconstruction technique. COMPARISON:  Brain MRI 12/12/2021.  Head CT 11/01/2019. FINDINGS: Brain: Stable cerebral volume. No midline shift, ventriculomegaly, mass effect, evidence of mass lesion, intracranial hemorrhage or evidence of cortically  based acute infarction. Gray-white differentiation is stable and within normal limits for age. Vascular: Calcified atherosclerosis at the skull base. No suspicious intracranial vascular hyperdensity. Skull: Stable and intact.  No acute osseous abnormality identified.  Sinuses/Orbits: Visualized paranasal sinuses and mastoids are stable and well aerated. Other: No acute orbit or scalp soft tissue injury identified. IMPRESSION: 1.  No acute traumatic injury identified. 2. Negative for age noncontrast CT appearance of the brain. Electronically Signed   By: Marlise Simpers M.D.   On: 09/18/2023 04:42    Medications / Allergies: per chart  Antibiotics: Anti-infectives (From admission, onward)    None         Note: Portions of this report may have been transcribed using voice recognition software. Every effort was made to ensure accuracy; however, inadvertent computerized transcription errors may be present.   Any transcriptional errors that result from this process are unintentional.    Eddye Goodie, MD, FACS, MASCRS Esophageal, Gastrointestinal & Colorectal Surgery Robotic and Minimally Invasive Surgery  [facility] [address] [place], [place] [address] [phone] Fax [phone] Main  CONTACT INFORMATION: Weekday (9AM-5PM): Call [facility] main office at [phone] Weeknight (5PM-9AM) or Weekend/Holiday: Check EPIC "Web Links" tab & use "AMION" (password " [email or web address]") for General Surgery [facility] coverage  Please, DO NOT use SecureChat  (it is not reliable communication to reach operating surgeons & will lead to a delay in care).   Epic staff messaging available for outptient concerns needing 1-2 business day response.       09/18/2023  5:42 AM

## 2023-09-18 NOTE — ED Triage Notes (Signed)
 Pt arrived via EMS from home for etoh and fall while walking to the bathroom r sided rib pain. Denies LOC

## 2023-09-18 NOTE — Evaluation (Signed)
 Occupational Therapy Evaluation Patient Details Name: Ana Johnston MRN: [id number] DOB: 10/19/1945 Today's Date: 09/18/2023   History of Present Illness   63 y fer old female admitted with right posterior chest wall pain following a mechanical fall at home. Found to have R 7-10 rib fractures, pneumothorax. PMH: HTN, COPD, osteopenia, spinal stenosis     Clinical Impressions The pt is currently presenting below her baseline level of functioning for self-care management, as she presents with below limitations (see OT problem list). During the session today, she required min assist for supine to sit, mod assist for lower body dressing, and CGA to stand using a RW. She intermittently presented with slower, guarded movements, due to pain secondary R rib fractures. She also reported discomfort with deep breathing. Without further OT services, she is at risk for restricted ADL participation and further deconditioning. Recommend home health OT at discharge with family supervision (pt reports her son and daughter in law are very near by and can assist as needed).      If plan is discharge home, recommend the following:   Assistance with cooking/housework;Help with stairs or ramp for entrance;A little help with bathing/dressing/bathroom     Functional Status Assessment   Patient has had a recent decline in their functional status and demonstrates the ability to make significant improvements in function in a reasonable and predictable amount of time.     Equipment Recommendations   None recommended by OT     Recommendations for Other Services         Precautions/Restrictions   Precautions Precautions: Fall Restrictions Weight Bearing Restrictions Per Provider Order: No     Mobility Bed Mobility Overal bed mobility: Needs Assistance Bed Mobility: Supine to Sit, Sit to Supine     Supine to sit: Min assist Sit to supine: Contact guard assist        Transfers Overall  transfer level: Needs assistance Equipment used: Rolling walker (2 wheels) Transfers: Sit to/from Stand Sit to Stand: Contact guard assist, From elevated surface                  Balance     Sitting balance-Leahy Scale: Good       Standing balance-Leahy Scale: Fair          ADL either performed or assessed with clinical judgement   ADL Overall ADL's : Needs assistance/impaired Eating/Feeding: Independent;Sitting   Grooming: Set up;Sitting           Upper Body Dressing : Set up;Sitting   Lower Body Dressing: Moderate assistance;Sitting/lateral leans Lower Body Dressing Details (indicate cue type and reason): limited by pain due to rib fractures Toilet Transfer: Contact guard assist;Ambulation                                Pertinent Vitals/Pain Pain Assessment Pain Assessment: 0-10 Pain Score: 5  Pain Location: R lateral chest especially with breathing Pain Intervention(s): Limited activity within patient's tolerance, Monitored during session     Extremity/Trunk Assessment Upper Extremity Assessment Upper Extremity Assessment: Overall WFL for tasks assessed;Right hand dominant   Lower Extremity Assessment Lower Extremity Assessment: Overall WFL for tasks assessed       Communication Communication Communication: No apparent difficulties   Cognition Arousal: Alert Behavior During Therapy: WFL for tasks assessed/performed Cognition: No apparent impairments      Following commands: Intact  Home Living Family/patient expects to be discharged to:: Private residence Living Arrangements: Alone Available Help at Discharge: Family;Available PRN/intermittently Type of Home: House       Home Layout: Two level;Able to live on main level with bedroom/bathroom     Bathroom Shower/Tub: Walk-in shower         Home Equipment: Shower seat;Cane - single point;Wheelchair - Counsellor (4 wheels)   Additional  Comments: pt stated son/daughter in law live nearby and could stay with her      Prior Functioning/Environment Prior Level of Function : Independent/Modified Independent;Driving             Mobility Comments:  (Independent) ADLs Comments: independent    OT Problem List: Decreased strength;Decreased activity tolerance;Impaired balance (sitting and/or standing);Decreased knowledge of use of DME or AE;Pain   OT Treatment/Interventions: Self-care/ADL training;Therapeutic exercise;Energy conservation;Therapeutic activities;Patient/family education;DME and/or AE instruction      OT Goals(Current goals can be found in the care plan section)   Acute Rehab OT Goals Patient Stated Goal: decreased pain & to return to normal activities OT Goal Formulation: With patient Time For Goal Achievement: 10/02/23 Potential to Achieve Goals: Good ADL Goals Pt Will Perform Grooming: with supervision;standing Pt Will Perform Lower Body Dressing: with supervision;sitting/lateral leans;sit to/from stand Pt Will Transfer to Toilet: with supervision;ambulating Pt Will Perform Toileting - Clothing Manipulation and hygiene: with supervision;sit to/from stand   OT Frequency:  Min 2X/week       AM-PAC OT "6 Clicks" Daily Activity     Outcome Measure Help from another person eating meals?: None Help from another person taking care of personal grooming?: A Little Help from another person toileting, which includes using toliet, bedpan, or urinal?: A Little Help from another person bathing (including washing, rinsing, drying)?: A Lot Help from another person to put on and taking off regular upper body clothing?: A Little Help from another person to put on and taking off regular lower body clothing?: A Lot 6 Click Score: 17   End of Session Equipment Utilized During Treatment: Oxygen Nurse Communication: Mobility status  Activity Tolerance: Patient limited by pain Patient left: in bed;with call  bell/phone within reach  OT Visit Diagnosis: Pain;Unsteadiness on feet (R26.81)                Time: 9147-8295 OT Time Calculation (min): 15 min Charges:  OT General Charges $OT Visit: 1 Visit OT Evaluation $OT Eval Moderate Complexity: 1 Mod    Keala Drum L Noreen Mackintosh, OTR/L 09/18/2023, 8:20 PM

## 2023-09-18 NOTE — Consult Note (Signed)
 Initial Consultation Note   Patient: Ana Johnston JYN:[id number] DOB: 07/28/1945 PCP: Natalia Leatherwood, DO DOA: 09/18/2023 DOS: the patient was seen and examined on 09/18/2023 Primary service: Md, Trauma, MD  Referring physician:  Reason for consult: Near syncope.  Assessment/Plan: Principal Problem:   Fall at home   Personal history of multiple falls Resulting in:   Traumatic fracture of ribs 7-10 of right side with pneumothorax Complicated by:   Pneumothorax, right   Subcutaneous emphysema due to trauma [facility]) Continue current treatment measures per trauma team.  Active Problems:   Essential hypertension Monitor blood pressure. IV metoprolol as needed.    Former heavy cigarette smoker (20-39 per day) - quit 2021   COPD (chronic obstructive pulmonary disease) (HCC) Continue supplemental oxygen.   Continue Breztri 2 puffs twice daily. Continue albuterol nebs.    Generalized anxiety disorder Continue buspirone 5 mg p.o. twice daily. Continue sertraline 50 mg p.o. daily.    Hyperlipidemia Continue atorvastatin 20 mg p.o. daily.    Coronary artery disease Continue statin and beta-blocker.    Osteopenia of multiple sites (-1.01-2020) Follow-up with PCP.    History of pulmonary embolus (PE) Continue DVT prophylaxis with enoxaparin.    Episodic lightheadedness Will check echocardiogram and carotid Doppler.    Hypokalemia  Supplemented orally. Also received magnesium sulfate.     TRH will continue to follow the patient.  HPI: Ana Johnston is a 78 y.o. female with past medical history of pulmonary embolism, arthritis, atypical chest pain, CAD, COPD, hyperlipidemia, cervical spinal stenosis, C. difficile colitis, GAD, depression, diverticulosis, diverticulitis, dyspnea on exertion, hypertension, seasonal allergies who was admitted by general surgery trauma team due to having a fall at home resulting in multiple right rib fractures complicated by subcutaneous emphysema and  pneumothorax.  Review of Systems: As mentioned in the history of present illness. All other systems reviewed and are negative. Past Medical History:  Diagnosis Date   Acute pulmonary embolism without acute cor pulmonale (HCC) 05/17/2022   Formatting of this note might be different from the original. CTA of the Chest (05/13/2022): Small subsegmental right lower lobe pulmonary embolus, likely subacute in age given its relatively peripheral location within the vessel.     Arthritis    Atypical chest pain 04/09/2022   Atypical chest pain     Clostridium difficile diarrhea 05/25/2022   Colitis    Depression    Diverticulitis    Dyspnea on exertion 04/09/2022   Dyspnea on exertion     History of Clostridioides difficile colitis 09/18/2023   Hypertension    Seasonal allergies    Past Surgical History:  Procedure Laterality Date   ABDOMINAL HYSTERECTOMY     BACK SURGERY  09/2016   CATARACT EXTRACTION, BILATERAL     CHOLECYSTECTOMY     1966   COLONOSCOPY  09/2022   TONSILLECTOMY AND ADENOIDECTOMY     1965   Social History:  reports that she has quit smoking. Her smoking use included cigarettes. She has never been exposed to tobacco smoke. She has never used smokeless tobacco. She reports current alcohol use. She reports that she does not use drugs.  No Known Allergies  Family History  Problem Relation Age of Onset   Diabetes Mother    Heart attack Father    Hearing loss Sister    Breast cancer Sister    Hyperlipidemia Brother    Heart attack Brother    Hearing loss Brother    Hypertension Son    Hyperlipidemia Son  Alcohol abuse Son     Prior to Admission medications   Medication Sig Start Date End Date Taking? Authorizing Provider  albuterol (VENTOLIN HFA) 108 (90 Base) MCG/ACT inhaler Inhale 2 puffs into the lungs every 6 (six) hours as needed for wheezing or shortness of breath. Patient not taking: Reported on 07/09/2023 05/16/23   Napolean Backbone A, DO  amLODipine  (NORVASC) 5 MG tablet Take 1 tablet (5 mg total) by mouth daily. 05/16/23   Kuneff, Renee A, DO  atorvastatin (LIPITOR) 20 MG tablet Take 1 tablet (20 mg total) by mouth daily. 05/16/23   Kuneff, Renee A, DO  budesonide (ENTOCORT EC) 3 MG 24 hr capsule Take 6 mg by mouth daily.    [provider]  busPIRone (BUSPAR) 5 MG tablet Take 1 tablet (5 mg total) by mouth 2 (two) times daily. 05/16/23   Kuneff, Renee A, DO  calcium-vitamin D (OSCAL WITH D) 500-200 MG-UNIT tablet Take 1 tablet by mouth daily with breakfast.    [provider]  Cholecalciferol (VITAMIN D3) 1000 units CHEW Chew 1 tablet by mouth daily. Patient not taking: Reported on 07/09/2023    [provider]  colestipol (COLESTID) 1 g tablet Take 1 g by mouth at bedtime. 09/11/23   [provider]  Cyanocobalamin (VITAMIN B-12 PO) Take 1 capsule by mouth daily.    [provider]  doxycycline (VIBRAMYCIN) 50 MG capsule Take 50 mg by mouth 2 (two) times daily. 08/27/23   [provider]  hydrochlorothiazide (MICROZIDE) 12.5 MG capsule Take 1 capsule (12.5 mg total) by mouth as needed. Patient taking differently: Take 12.5 mg by mouth daily. 05/16/23   Kuneff, Renee A, DO  magnesium 30 MG tablet Take 30 mg by mouth daily.    [provider]  Melatonin 10 MG TABS Take 10 mg by mouth at bedtime as needed (sleep).    [provider]  meloxicam (MOBIC) 15 MG tablet Take 1 tablet (15 mg total) by mouth daily. 05/16/23   Kuneff, Renee A, DO  montelukast (SINGULAIR) 10 MG tablet Take 1 tablet (10 mg total) by mouth daily. 05/16/23   Kuneff, Renee A, DO  Omega-3 Fatty Acids (FISH OIL PO) Take 1 tablet by mouth daily.    [provider]  sertraline (ZOLOFT) 50 MG tablet Take 1 tablet (50 mg total) by mouth daily. 05/16/23   Kuneff, Renee A, DO  tiZANidine (ZANAFLEX) 4 MG tablet Take 0.5-1 tablets (2-4 mg total) by mouth every 8 (eight) hours as needed for muscle spasms.  05/23/23   Kuneff, Renee A, DO  TRELEGY ELLIPTA 200-62.5-25 MCG/ACT AEPB Inhale 1 Inhalation into the lungs daily. 05/16/23   Kuneff, Renee A, DO  XYZAL ALLERGY 24HR 5 MG tablet Take 1 tablet (5 mg total) by mouth every evening. 05/16/23   Napolean Backbone A, DO    Physical Exam: Vitals:   09/18/23 0522 09/18/23 0645 09/18/23 0745 09/18/23 0801  BP: 105/61 108/71 126/74   Pulse: 73 73 70   Resp: 16 (!) 24 (!) 21   Temp: 98.1 F (36.7 C)   97.9 F (36.6 C)  TempSrc: Oral   Oral  SpO2: 100% 98% 100%   Weight:      Height:       Physical Exam Vitals and nursing note reviewed.  Constitutional:      General: She is awake. She is not in acute distress.    Appearance: Normal appearance. She is ill-appearing.     Interventions:  Nasal cannula in place.  HENT:     Head: Normocephalic.     Nose: No rhinorrhea.     Mouth/Throat:     Mouth: Mucous membranes are moist.  Eyes:     General: No scleral icterus.    Pupils: Pupils are equal, round, and reactive to light.  Neck:     Vascular: No JVD.  Cardiovascular:     Rate and Rhythm: Normal rate and regular rhythm.     Heart sounds: S1 normal and S2 normal.  Pulmonary:     Effort: Pulmonary effort is normal.     Breath sounds: Examination of the right-lower field reveals decreased breath sounds. Examination of the left-lower field reveals decreased breath sounds. Decreased breath sounds present. No wheezing, rhonchi or rales.  Abdominal:     General: Bowel sounds are normal. There is no distension.     Palpations: Abdomen is soft.     Tenderness: There is no abdominal tenderness.  Musculoskeletal:     Cervical back: Neck supple.     Right lower leg: No edema.     Left lower leg: No edema.  Skin:    General: Skin is warm and dry.  Neurological:     General: No focal deficit present.     Mental Status: She is alert and oriented to person, place, and time.  Psychiatric:        Mood and Affect: Mood normal.        Behavior: Behavior  normal. Behavior is cooperative.     Data Reviewed:   Results are pending, will review when available.  EKG: Vent. rate 74 BPM PR interval 136 ms QRS duration 89 ms QT/QTcB 387/430 ms P-R-T axes 242 -30 107 Sinus rhythm LVH with secondary repolarization abnormality Anterior Q waves, possibly due to LVH  Family Communication:  Primary team communication:  Thank you very much for involving us  in the care of your patient.  Author: Danice Dural, MD 09/18/2023 8:04 AM  For on call review [email or web address].   This document was prepared using Dragon voice recognition software and may contain some unintended transcription errors.

## 2023-09-19 ENCOUNTER — Other Ambulatory Visit: Payer: Self-pay

## 2023-09-19 ENCOUNTER — Inpatient Hospital Stay (HOSPITAL_COMMUNITY)

## 2023-09-19 ENCOUNTER — Encounter (HOSPITAL_COMMUNITY): Payer: Self-pay

## 2023-09-19 DIAGNOSIS — S270XXA Traumatic pneumothorax, initial encounter: Secondary | ICD-10-CM | POA: Diagnosis not present

## 2023-09-19 DIAGNOSIS — R55 Syncope and collapse: Secondary | ICD-10-CM | POA: Diagnosis not present

## 2023-09-19 DIAGNOSIS — S2241XA Multiple fractures of ribs, right side, initial encounter for closed fracture: Secondary | ICD-10-CM | POA: Diagnosis not present

## 2023-09-19 LAB — BASIC METABOLIC PANEL WITH GFR
Anion gap: 12 (ref 5–15)
BUN: 13 mg/dL (ref 8–23)
CO2: 22 mmol/L (ref 22–32)
Calcium: 9.3 mg/dL (ref 8.9–10.3)
Chloride: 98 mmol/L (ref 98–111)
Creatinine, Ser: 1.24 mg/dL — ABNORMAL HIGH (ref 0.44–1.00)
GFR, Estimated: 45 mL/min — ABNORMAL LOW (ref >60)
Glucose, Bld: 154 mg/dL — ABNORMAL HIGH (ref 70–99)
Potassium: 4 mmol/L (ref 3.5–5.1)
Sodium: 132 mmol/L — ABNORMAL LOW (ref 135–145)

## 2023-09-19 LAB — CBC
HCT: 36.4 % (ref 36.0–46.0)
Hemoglobin: 12.1 g/dL (ref 12.0–15.0)
MCH: 31.8 pg (ref 26.0–34.0)
MCHC: 33.2 g/dL (ref 30.0–36.0)
MCV: 95.5 fL (ref 80.0–100.0)
Platelets: 262 10*3/uL (ref 150–400)
RBC: 3.81 MIL/uL — ABNORMAL LOW (ref 3.87–5.11)
RDW: 13.8 % (ref 11.5–15.5)
WBC: 10.3 10*3/uL (ref 4.0–10.5)
nRBC: 0 % (ref 0.0–0.2)

## 2023-09-19 NOTE — Progress Notes (Signed)
 Pt repositioned in bed. C/o pain in her right side. Medicated with dilaudid  1mg  IV. Sr x3 elevated. Call light in reach. Bed in low position. Bed alarm activated.

## 2023-09-19 NOTE — Progress Notes (Signed)
 Carotid arterial duplex completed. Please see CV Procedures for preliminary results.  Estanislao Heimlich, RVT 09/19/23 1:59 PM

## 2023-09-19 NOTE — Progress Notes (Addendum)
 PROGRESS NOTE    Ana Johnston  WUJ:[id number] DOB: Jan 30, 1946 DOA: 09/18/2023 PCP: Mariel Shope, DO   Brief Narrative:  Ana Johnston is a 78 y.o. female with past medical history of pulmonary embolism, arthritis, atypical chest pain, CAD, COPD, hyperlipidemia, cervical spinal stenosis, C. difficile colitis, GAD, depression, diverticulosis, diverticulitis, dyspnea on exertion, hypertension, seasonal allergies who was admitted by general surgery trauma team due to having a fall at home resulting in multiple right rib fractures complicated by subcutaneous emphysema and hemo/pneumothorax.   Assessment & Plan:   Principal Problem:   Traumatic fracture of ribs 7-10 of right side with pneumothorax Active Problems:   Essential hypertension   COPD (chronic obstructive pulmonary disease) (HCC)   Generalized anxiety disorder   Hyperlipidemia   Coronary artery disease   Osteopenia of multiple sites (-1.01-2020)   Fall at home   Pneumothorax, right   Former heavy cigarette smoker (20-39 per day) - quit 2021   History of pulmonary embolus (PE)   Personal history of multiple falls   Subcutaneous emphysema due to trauma [facility])   Episodic lightheadedness   Traumatic fracture of ribs with pneumothorax   Hypokalemia   Acute hypoxic respiratory failure COPD (chronic obstructive pulmonary disease) (HCC) not in acute exacerbation Secondary to rib fractures/trauma (see below) Wean oxygen as tolerated, walk screen pending Continue incentive spirometry and flutter Continue Breztri  2 puffs twice daily/albuterol  nebs. Former heavy cigarette smoker (20-39 per day) - quit 2021  AKI, mild  - Likely secondary to poor p.o. intake in the setting of above, continue to increase p.o. intake as appropriate  Essential hypertension Well-controlled off medications  Generalized anxiety disorder Continue buspirone , sertraline   Hyperlipidemia Continue atorvastatin   Coronary artery disease Continue  statin Beta-blocker on hold   Osteopenia of multiple sites (-1.01-2020) Follow-up with PCP.   History of pulmonary embolus (PE) Continue DVT prophylaxis with enoxaparin .   Episodic lightheadedness Will check echocardiogram and carotid Doppler.   Hypokalemia  Supplemented orally. Also received magnesium  sulfate.  Ambulatory dysfunction with multiple falls at home Traumatic fracture of ribs 7-10 of right side with pneumothorax Pneumothorax, right Subcutaneous emphysema due to trauma [facility]) Continue current treatment measures per trauma team.   DVT prophylaxis: enoxaparin  (LOVENOX ) injection 30 mg Start: 09/19/23 1000 SCDs Start: 09/18/23 0702 Code Status:   Code Status: Full Code Family Communication: None present  Status is: Inpatient  Dispo: The patient is from: Home              Anticipated d/c is to: To be determined              Anticipated d/c date is: 24 to 48 hours              Patient currently not medically stable for discharge  Consultants:  We are, trauma primary  Procedures:  None  Antimicrobials:  None  Subjective: No acute issues or events overnight, pain currently well-controlled, respiratory status improving but not yet back to baseline  Objective: Vitals:   09/18/23 2030 09/19/23 0027 09/19/23 0459 09/19/23 0746  BP: 109/68 106/61 137/71 (!) 103/59  Pulse: 74 71 95 81  Resp: 16 16 17 16   Temp: 97.7 F (36.5 C) 98.5 F (36.9 C) 98.9 F (37.2 C)   TempSrc: Oral Oral Oral   SpO2: 92% 95% 95% (!) 85%  Weight:      Height:       No intake or output data in the 24 hours ending 09/19/23 0804 [employer]  09/18/23 0156  Weight: 66.7 kg    Examination:  General:  Pleasantly resting in bed, No acute distress. HEENT:  Normocephalic atraumatic.  Sclerae nonicteric, noninjected.  Extraocular movements intact bilaterally. Neck:  Without mass or deformity.  Trachea is midline. Lungs: Diminished without overt rhonchi, wheeze, or rales. Heart:   Regular rate and rhythm.  Without murmurs, rubs, or gallops. Abdomen:  Soft, nontender, nondistended.  Without guarding or rebound. Extremities: Without cyanosis, clubbing, edema, or obvious deformity. Skin:  Warm and dry, no erythema.  Data Reviewed: I have personally reviewed following labs and imaging studies  CBC: Recent Labs  Lab 09/18/23 0252 09/18/23 0302 09/19/23 0403  WBC 14.9*  --  10.3  NEUTROABS 12.1*  --   --   HGB 14.8 15.6* 12.1  HCT 45.4 46.0 36.4  MCV 96.4  --  95.5  PLT 317  --  262   Basic Metabolic Panel: Recent Labs  Lab 09/18/23 0252 09/18/23 0302 09/19/23 0403  NA 135 135 132*  K 3.3* 3.2* 4.0  CL 98 99 98  CO2 23  --  22  GLUCOSE 100* 106* 154*  BUN 14 13 13   CREATININE 0.50 0.90 1.24*  CALCIUM  9.3  --  9.3  MG 1.8  --   --   PHOS 2.7  --   --    GFR: Estimated Creatinine Clearance: 31.9 mL/min (A) (by C-G formula based on SCr of 1.24 mg/dL (H)).  Liver Function Tests: Recent Labs  Lab 09/18/23 0252  AST 36  ALT 30  ALKPHOS 82  BILITOT 0.7  PROT 7.1  ALBUMIN 3.7   No results found for this or any previous visit (from the past 240 hours).   Radiology Studies: DG CHEST PORT 1 VIEW Result Date: 09/19/2023 CLINICAL DATA:  77 year old female with fall with right rib fractures, right pneumothorax. EXAM: PORTABLE CHEST 1 VIEW COMPARISON:  Chest CT and radiographs yesterday. FINDINGS: Portable AP semi upright view at 0607 hours. Mildly improved lung volumes. Mediastinal contours are stable and within normal limits. No right pneumothorax now visible. But ongoing moderate right chest wall, abdominal wall, and supraclavicular subcutaneous gas which has mildly increased. Right rib fractures better demonstrated by CT. No confluent lung opacity now. Visualized tracheal air column is within normal limits. Chronic left rib fractures. Left lung appears negative. Nonobstructed visible bowel gas pattern. Augmented T12 compression fracture again noted.  IMPRESSION: 1. Ongoing, mildly increased body wall subcutaneous emphysema but no right pneumothorax visible now. 2. Right rib fractures better demonstrated by CT. No new cardiopulmonary abnormality. Electronically Signed   By: Marlise Simpers M.D.   On: 09/19/2023 06:31   ECHOCARDIOGRAM COMPLETE Result Date: 09/18/2023    ECHOCARDIOGRAM REPORT   Patient Name:   Ana Johnston Date of Exam: 09/18/2023 Medical Rec #:  [id number]    Height:       60.0 in Accession #:    [id number]   Weight:       147.0 lb Date of Birth:  1946/02/20    BSA:          1.638 m Patient Age:    78 years     BP:           131/69 mmHg Patient Gender: F            HR:           66 bpm. Exam Location:  Inpatient Procedure: 2D Echo, Color Doppler, Cardiac Doppler and Intracardiac  Opacification Agent (Both Spectral and Color Flow Doppler were            utilized during procedure). Indications:    R55 Near-Syncope  History:        Patient has prior history of Echocardiogram examinations, most                 recent 05/01/2022. CAD, COPD; Risk Factors:Hypertension and                 Dyslipidemia.  Sonographer:    Sherline Distel Senior RDCS Referring Phys: [phone] DAVID MANUEL ORTIZ  Sonographer Comments: Technically difficult due to COPD, scanned supine due to broken ribs IMPRESSIONS  1. Left ventricular ejection fraction, by estimation, is 50 to 55%. The left ventricle has low normal function. The left ventricle demonstrates regional wall motion abnormalities (see scoring diagram/findings for description). Left ventricular diastolic  parameters are consistent with Grade I diastolic dysfunction (impaired relaxation).  2. Right ventricular systolic function is normal. The right ventricular size is normal.  3. The mitral valve is normal in structure. No evidence of mitral valve regurgitation. No evidence of mitral stenosis.  4. The aortic valve is tricuspid. Aortic valve regurgitation is not visualized. No aortic stenosis is present.  5. The inferior vena  cava is normal in size with greater than 50% respiratory variability, suggesting right atrial pressure of 3 mmHg. FINDINGS  Left Ventricle: Left ventricular ejection fraction, by estimation, is 50 to 55%. The left ventricle has low normal function. The left ventricle demonstrates regional wall motion abnormalities. Definity  contrast agent was given IV to delineate the left ventricular endocardial borders. The left ventricular internal cavity size was normal in size. There is no left ventricular hypertrophy. Left ventricular diastolic parameters are consistent with Grade I diastolic dysfunction (impaired relaxation).  LV Wall Scoring: The basal inferoseptal segment is hypokinetic. Right Ventricle: The right ventricular size is normal. No increase in right ventricular wall thickness. Right ventricular systolic function is normal. Left Atrium: Left atrial size was normal in size. Right Atrium: Right atrial size was normal in size. Pericardium: There is no evidence of pericardial effusion. Mitral Valve: The mitral valve is normal in structure. No evidence of mitral valve regurgitation. No evidence of mitral valve stenosis. Tricuspid Valve: The tricuspid valve is normal in structure. Tricuspid valve regurgitation is not demonstrated. No evidence of tricuspid stenosis. Aortic Valve: The aortic valve is tricuspid. Aortic valve regurgitation is not visualized. No aortic stenosis is present. Pulmonic Valve: The pulmonic valve was normal in structure. Pulmonic valve regurgitation is not visualized. No evidence of pulmonic stenosis. Aorta: The aortic root and ascending aorta are structurally normal, with no evidence of dilitation. Venous: The inferior vena cava is normal in size with greater than 50% respiratory variability, suggesting right atrial pressure of 3 mmHg. IAS/Shunts: No atrial level shunt detected by color flow Doppler.  LEFT VENTRICLE PLAX 2D LVIDd:         4.40 cm   Diastology LVIDs:         3.40 cm   LV e'  medial:    4.57 cm/s LV PW:         0.90 cm   LV E/e' medial:  12.7 LV IVS:        0.70 cm   LV e' lateral:   6.53 cm/s LVOT diam:     1.90 cm   LV E/e' lateral: 8.9 LV SV:         55 LV SV Index:  34 LVOT Area:     2.84 cm  RIGHT VENTRICLE RV S prime:     10.80 cm/s TAPSE (M-mode): 2.3 cm LEFT ATRIUM             Index        RIGHT ATRIUM           Index LA diam:        2.80 cm 1.71 cm/m   RA Area:     14.00 cm LA Vol (A2C):   47.4 ml 28.94 ml/m  RA Volume:   32.40 ml  19.78 ml/m LA Vol (A4C):   30.1 ml 18.38 ml/m LA Biplane Vol: 39.1 ml 23.88 ml/m  AORTIC VALVE LVOT Vmax:   104.00 cm/s LVOT Vmean:  70.800 cm/s LVOT VTI:    0.194 m  AORTA Ao Root diam: 3.10 cm Ao Asc diam:  3.50 cm MITRAL VALVE MV Area (PHT): 3.12 cm    SHUNTS MV Decel Time: 243 msec    Systemic VTI:  0.19 m MV E velocity: 58.10 cm/s  Systemic Diam: 1.90 cm MV A velocity: 88.80 cm/s MV E/A ratio:  0.65 Arta Lark Electronically signed by Arta Lark Signature Date/Time: 09/18/2023/3:41:45 PM    Final    DG CHEST PORT 1 VIEW Result Date: 09/18/2023 CLINICAL DATA:  Patient fell while walking to the bathroom. Right-sided rib pain. EXAM: PORTABLE CHEST 1 VIEW COMPARISON:  09/18/2023 FINDINGS: Small right apical pneumothorax has become more conspicuous in the interval. No substantial pleural effusion. Interstitial markings are diffusely coarsened with chronic features. The cardiopericardial silhouette is within normal limits for size. Multiple acute right rib fractures were better characterized on CT imaging earlier today. Old healed left-sided rib fractures evident. Soft tissue gas noted in the right chest wall and lower neck. Telemetry leads overlie the chest. IMPRESSION: 1. Small right apical pneumothorax has become more conspicuous in the interval. 2. Multiple acute right rib fractures were better characterized on CT imaging earlier today. Electronically Signed   By: Donnal Fusi M.D.   On: 09/18/2023 07:35   CT CHEST ABDOMEN  PELVIS W CONTRAST Addendum Date: 09/18/2023 ADDENDUM REPORT: 09/18/2023 05:59 ADDENDUM: Study discussed by telephone with Dr. Townsend Freud on 09/18/2023 at 0504 hours. Electronically Signed   By: Marlise Simpers M.D.   On: 09/18/2023 05:59   Result Date: 09/18/2023 CLINICAL DATA:  78 year old female status post fall while walking to bathroom. Right rib pain. EXAM: CT CHEST, ABDOMEN, AND PELVIS WITH CONTRAST TECHNIQUE: Multidetector CT imaging of the chest, abdomen and pelvis was performed following the standard protocol during bolus administration of intravenous contrast. RADIATION DOSE REDUCTION: This exam was performed according to the departmental dose-optimization program which includes automated exposure control, adjustment of the mA and/or kV according to patient size and/or use of iterative reconstruction technique. CONTRAST:  OMNIPAQUE  IOHEXOL  300 MG/ML  SOLN COMPARISON:  CTA chest 04/16/2023 and earlier. FINDINGS: CT CHEST FINDINGS Cardiovascular: Calcified aortic atherosclerosis. Calcified coronary artery atherosclerosis. Heart size remains normal. No pericardial effusion. Mediastinal vascular structures appear to remain intact. Mediastinum/Nodes: Negative. No mediastinal hematoma, gas, mediastinal mass or lymphadenopathy. Lungs/Pleura: Small to moderate right pneumothorax most apparent at the anterior costophrenic angle series 8, image 83. Major airways remain patent. Mild dependent atelectasis in both lungs. Asymmetric patchy right perihilar and peripheral right middle lobe lung opacity indeterminate for atelectasis versus contusion. Trace layering right pleural fluid. Subtle centrilobular emphysema suspected. No left-side pneumothorax, pleural effusion, or pulmonary contusion. Musculoskeletal: Displaced right lateral 7th rib fracture.  Comminuted and severely displaced posterior right 8th rib fracture, with fracture fragments displaced greater than 1 cm. And a separate fracture of the lateral right 8th  rib. See series 8, images 75 through 101. Comminuted right posterior 9th rib fracture. Separate fracture of the lateral 9th rib. Mildly displaced posterior right 10th rib fracture. Associated moderate to large volume of posttraumatic subcutaneous emphysema along the right posterolateral chest wall and flank. The visible shoulder osseous structures appear intact and aligned with chronic glenohumeral degeneration. No sternal fracture identified. Multilevel chronic but no acute left-sided rib fractures identified. Previously augmented chronic T12 compression fracture. No acute thoracic vertebral fracture identified. CT ABDOMEN PELVIS FINDINGS Hepatobiliary: Adjacent rib fractures, abnormal right lung base, and lower chest and abdominal wall posttraumatic subcutaneous gas but the liver appears to remain intact. No perihepatic fluid. 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 Pancreas: Negative. Spleen: Spleen appears intact.  No perisplenic fluid. Adrenals/Urinary Tract: Normal adrenal glands. Kidneys appears symmetric and intact with symmetric renal enhancement and contrast excretion. Unremarkable bladder. Incidental pelvic phleboliths. Stomach/Bowel: Nondilated large and small bowel with redundant large bowel intermittently containing gas. Cecum on a lax mesentery in the anterior abdomen with normal appendix on coronal image 105. No pneumoperitoneum. No free fluid. Small volume retained fluid in the stomach and proximal duodenum. Vascular/Lymphatic: Aortoiliac calcified atherosclerosis. Normal caliber abdominal aorta with tortuosity. Major arterial structures in the abdomen and pelvis remain patent. Portal venous system is patent. No lymphadenopathy identified. Reproductive: Absent uterus. Diminutive ovaries are within normal limits. Other: No pelvis free fluid. Musculoskeletal: Augmented T12 compression fracture and lumbar vertebrae appear stable since a 2023 CT Abdomen and Pelvis. Chronic lumbar degenerative  spondylolisthesis and vacuum disc. No lumbar, sacral, pelvic, or proximal femur fracture identified. And no posttraumatic soft tissue gas below the L2 spinous process. IMPRESSION: 1. Positive for Acute, displaced fractures of the right ribs 7 through 10. Severely displaced right 8th rib fracture, and the right 8th and 9th ribs both fractured in two places. 2. Associated small to moderate Right Pneumothorax. Trace right hemothorax. Right perihilar and middle lobe lung opacity indeterminate for pulmonary contusion versus atelectasis. 3. Associated posttraumatic subcutaneous emphysema along the right chest wall and flank. 4. No other acute traumatic injury identified in the chest, abdomen, or pelvis. 5.  Aortic Atherosclerosis (ICD10-I70.0). Electronically Signed: By: Marlise Simpers M.D. On: 09/18/2023 04:58   DG Chest Port 1 View Result Date: 09/18/2023 CLINICAL DATA:  [id number].  Posttraumatic pneumothorax. EXAM: PORTABLE CHEST 1 VIEW COMPARISON:  Chest CT earlier today at 4:09 a.m. FINDINGS: 5:36 a.m. extensive right chest wall/supraclavicular fossa soft tissue emphysema again is noted. There is a small right medial apical pneumothorax. The CT also demonstrated anterior pneumothorax extending inferiorly to the chest base but this is not well seen AP. This has probably not notably changed. On CT it is estimated up to 20% of the chest volume. There is no increased contralateral mediastinal shift. There is hazy increased opacity in the right upper lobe and right base which is probably due to contusions. The left lung is clear with overall generally stable aeration, with low lung volumes. The cardiomediastinal silhouette and vasculature are normal. There is aortic atherosclerosis. There is right acromiohumeral abutment consistent with chronic rotator cuff arthropathy. Fractures of the right seventh through tenth ribs, some with comminution, are again noted. No new fracture has become apparent. Osteopenia. IMPRESSION: 1. Small  right medial apical pneumothorax. The CT also demonstrated anterior pneumothorax extending inferiorly to the chest base but this  is not well seen AP. This has probably not notably changed. On CT it is estimated up to 20% of the chest volume. 2. Extensive right chest wall/supraclavicular fossa soft tissue emphysema. 3. Right seventh through tenth rib fractures, some with comminution. 4. Hazy increased opacity in the right upper lobe and right base which is probably due to contusions. 5. Aortic atherosclerosis. Electronically Signed   By: Denman Fischer M.D.   On: 09/18/2023 05:59   CT Cervical Spine Wo Contrast Result Date: 09/18/2023 CLINICAL DATA:  78 year old female status post fall while walking to bathroom. Right rib pain. EXAM: CT CERVICAL SPINE WITHOUT CONTRAST TECHNIQUE: Multidetector CT imaging of the cervical spine was performed without intravenous contrast. Multiplanar CT image reconstructions were also generated. RADIATION DOSE REDUCTION: This exam was performed according to the departmental dose-optimization program which includes automated exposure control, adjustment of the mA and/or kV according to patient size and/or use of iterative reconstruction technique. COMPARISON:  Head and chest CT today.  Neck MRA 12/12/2021. FINDINGS: Alignment: Straightening of cervical lordosis with mild degenerative appearing anterolisthesis of C4 on C5 (2 mm). Cervicomedullary junction is within normal limits. Bilateral posterior element alignment is within normal limits. Skull base and vertebrae: Bone mineralization is within normal limits for age. Visualized skull base is intact. No atlanto-occipital dissociation. C1 and C2 appear chronically degenerated but intact and aligned. No acute osseous abnormality identified in the cervical spine. Soft tissues and spinal canal: No prevertebral fluid or swelling. No visible canal hematoma. Necklace artifact. Retropharyngeal course of both carotids. Posttraumatic  subcutaneous emphysema along the right posterolateral neck tracking to the right chest and shoulder. 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 Bulky and partially calcified ligamentous hypertrophy about the odontoid. However, capacious CT appearance of the cervical spinal canal. Upper chest: Abnormal, see dedicated chest CT reported separately. IMPRESSION: 1. No acute traumatic injury identified in the cervical spine. 2. Posttraumatic subcutaneous emphysema appears to be tracking up from the chest into the right posterolateral neck. See Chest CT reported separately. 3. 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 Electronically Signed   By: Marlise Simpers M.D.   On: 09/18/2023 04:45   CT Head Wo Contrast Result Date: 09/18/2023 CLINICAL DATA:  78 year old female status post fall while walking to bathroom. Right rib pain. EXAM: CT HEAD WITHOUT CONTRAST TECHNIQUE: Contiguous axial images were obtained from the base of the skull through the vertex without intravenous contrast. RADIATION DOSE REDUCTION: This exam was performed according to the departmental dose-optimization program which includes automated exposure control, adjustment of the mA and/or kV according to patient size and/or use of iterative reconstruction technique. COMPARISON:  Brain MRI 12/12/2021.  Head CT 11/01/2019. FINDINGS: Brain: Stable cerebral volume. No midline shift, ventriculomegaly, mass effect, evidence of mass lesion, intracranial hemorrhage or evidence of cortically based acute infarction. Gray-white differentiation is stable and within normal limits for age. Vascular: Calcified atherosclerosis at the skull base. No suspicious intracranial vascular hyperdensity. Skull: Stable and intact.  No acute osseous abnormality identified. Sinuses/Orbits: Visualized paranasal sinuses and mastoids are stable and well aerated. Other: No  acute orbit or scalp soft tissue injury identified. IMPRESSION: 1.  No acute traumatic injury identified. 2. Negative for age noncontrast CT appearance of the brain. Electronically Signed   By: Marlise Simpers M.D.   On: 09/18/2023 04:42   Scheduled Meds:  atorvastatin   20 mg Oral Daily   budeson-glycopyrrolate -formoterol   2 puff Inhalation  BID   budesonide   6 mg Oral Daily   busPIRone   5 mg Oral BID   calcium -vitamin D  1 tablet Oral Q breakfast   vitamin B-12  1,000 mcg Oral Daily   docusate sodium   100 mg Oral BID   enoxaparin  (LOVENOX ) injection  30 mg Subcutaneous Q12H   lidocaine   2 patch Transdermal Q24H   methocarbamol   500 mg Oral Q8H   Or   methocarbamol  (ROBAXIN ) injection  500 mg Intravenous Q8H   montelukast   10 mg Oral Daily   naproxen   250 mg Oral TID WC   polycarbophil  625 mg Oral BID   sertraline   50 mg Oral Daily   Continuous Infusions:  lactated ringers        LOS: 1 day   Time spent:  Haydee Lipa, DO [facility]  If 7PM-7AM, please contact night-coverage [email or web address]  09/19/2023, 8:04 AM

## 2023-09-19 NOTE — Progress Notes (Signed)
 Trauma Event Note    Family requests a PT consult and possibly PT follow up at home, per daughter in law- ortho PT was going to be started. Pt lives by herself and has been having a difficult time lifting legs up.  Has 3 or 4 steps going into house from garage and has railings, but needs strengthening to lift feet per family.    Last imported Vital Signs BP (!) 103/59 (BP Location: Right Arm)   Pulse 81   Temp 98.9 F (37.2 C) (Oral)   Resp 16   Ht 5' (1.524 m)   Wt 147 lb (66.7 kg)   SpO2 (!) 85%   BMI 28.71 kg/m   Trending CBC Recent Labs    09/18/23 0252 09/18/23 0302 09/19/23 0403  WBC 14.9*  --  10.3  HGB 14.8 15.6* 12.1  HCT 45.4 46.0 36.4  PLT 317  --  262    Trending Coag's No results for input(s): APTT, INR in the last 72 hours.  Trending BMET Recent Labs    09/18/23 0252 09/18/23 0302 09/19/23 0403  NA 135 135 132*  K 3.3* 3.2* 4.0  CL 98 99 98  CO2 23  --  22  BUN 14 13 13   CREATININE 0.50 0.90 1.24*  GLUCOSE 100* 106* 154*      Jadis Pitter M Shemeca Lukasik  Trauma Response RN  Please call TRN at [phone] for further assistance.

## 2023-09-19 NOTE — Progress Notes (Signed)
 Mobility Specialist Progress Note:    09/19/23 1000  Mobility  Activity Ambulated with assistance in hallway  Level of Assistance Contact guard assist, steadying assist  Assistive Device Front wheel walker  Distance Ambulated (ft) 70 ft  Activity Response Tolerated well  Mobility Referral Yes  Mobility visit 1 Mobility  Mobility Specialist Start Time (ACUTE ONLY) 0947  Mobility Specialist Stop Time (ACUTE ONLY) 0959  Mobility Specialist Time Calculation (min) (ACUTE ONLY) 12 min   Pt received in bed and agreeable. Required only contact guard throughout. Asymptomatic w/ no complaints. Pt left in bed with call bell and all needs met. Family present.  D'Vante Nolon Baxter Mobility Specialist Please contact via Special educational needs teacher or Rehab office at [phone]

## 2023-09-19 NOTE — Progress Notes (Signed)
 Pt at bedrest. Medicated x1 for c/o pain in her rt side/flank. No acute distress noted. Call light in reach. Sr x3 elevated. Bed in low position.

## 2023-09-19 NOTE — Progress Notes (Signed)
 Assessment & Plan: HD#2 Traumatic fracture of ribs 7-10 of right side with pneumothorax Active Problems:   Personal history of multiple falls   Essential hypertension   COPD (chronic obstructive pulmonary disease) (HCC)   Generalized anxiety disorder   Hyperlipidemia   Coronary artery disease   Osteopenia of multiple sites (-1.01-2020)   Fall at home   Pneumothorax, right   Former heavy cigarette smoker (20-39 per day) - quit 2021   History of pulmonary embolus (PE)  Patient appears stable at this time with improvement in pain.  Moving around room and to bathroom.  CXR this AM without PTX but some subcutaneous air noted.  Crepitance on exam, mild.  Will repeat CXR in AM 4/19.        [name], MD [facility] practice Office: [phone]        Chief Complaint: Fall, rib fx's  Subjective: Patient up to bathroom, sitting in lounge chair.  Pain improved.  Objective: Vital signs in last 24 hours: Temp:  [97.7 F (36.5 C)-98.9 F (37.2 C)] 98.9 F (37.2 C) (04/18 0459) Pulse Rate:  [63-95] 81 (04/18 0746) Resp:  [12-18] 16 (04/18 0746) BP: (103-137)/(59-88) 103/59 (04/18 0746) SpO2:  [85 %-100 %] 85 % (04/18 0746) Last BM Date : 09/16/23  Intake/Output from previous day: No intake/output data recorded. Intake/Output this shift: No intake/output data recorded.  Physical Exam: HEENT - sclerae clear, mucous membranes moist Chest - ecchymosis on right flank; moderate tenderness to palpation right chest wall; mild crepitance right posterior chest wall  Lab Results:  Recent Labs    09/18/23 0252 09/18/23 0302 09/19/23 0403  WBC 14.9*  --  10.3  HGB 14.8 15.6* 12.1  HCT 45.4 46.0 36.4  PLT 317  --  262   BMET Recent Labs    09/18/23 0252 09/18/23 0302 09/19/23 0403  NA 135 135 132*  K 3.3* 3.2* 4.0  CL 98 99 98  CO2 23  --  22  GLUCOSE 100* 106* 154*  BUN 14 13 13   CREATININE 0.50 0.90 1.24*  CALCIUM  9.3  --  9.3    PT/INR No results for input(s): "LABPROT", "INR" in the last 72 hours. Comprehensive Metabolic Panel:    Component Value Date/Time   NA 132 (L) 09/19/2023 0403   NA 135 09/18/2023 0302   NA 135 (A) 12/27/2021 0000   K 4.0 09/19/2023 0403   K 3.2 (L) 09/18/2023 0302   CL 98 09/19/2023 0403   CL 99 09/18/2023 0302   CO2 22 09/19/2023 0403   CO2 23 09/18/2023 0252   BUN 13 09/19/2023 0403   BUN 13 09/18/2023 0302   BUN 15 12/27/2021 0000   CREATININE 1.24 (H) 09/19/2023 0403   CREATININE 0.90 09/18/2023 0302   GLUCOSE 154 (H) 09/19/2023 0403   GLUCOSE 106 (H) 09/18/2023 0302   CALCIUM  9.3 09/19/2023 0403   CALCIUM  9.3 09/18/2023 0252   AST 36 09/18/2023 0252   AST 24 08/22/2022 1201   ALT 30 09/18/2023 0252   ALT 21 08/22/2022 1201   ALKPHOS 82 09/18/2023 0252   ALKPHOS 101 08/22/2022 1201   BILITOT 0.7 09/18/2023 0252   BILITOT 0.7 08/22/2022 1201   BILITOT 0.6 05/24/2022 1557   PROT 7.1 09/18/2023 0252   PROT 6.9 08/22/2022 1201   PROT 7.4 05/24/2022 1557   ALBUMIN 3.7 09/18/2023 0252   ALBUMIN 4.2 08/22/2022 1201   ALBUMIN 3.5 05/24/2022 1557    Studies/Results: DG CHEST  PORT 1 VIEW Result Date: 09/19/2023 CLINICAL DATA:  78 year old female with fall with right rib fractures, right pneumothorax. EXAM: PORTABLE CHEST 1 VIEW COMPARISON:  Chest CT and radiographs yesterday. FINDINGS: Portable AP semi upright view at 0607 hours. Mildly improved lung volumes. Mediastinal contours are stable and within normal limits. No right pneumothorax now visible. But ongoing moderate right chest wall, abdominal wall, and supraclavicular subcutaneous gas which has mildly increased. Right rib fractures better demonstrated by CT. No confluent lung opacity now. Visualized tracheal air column is within normal limits. Chronic left rib fractures. Left lung appears negative. Nonobstructed visible bowel gas pattern. Augmented T12 compression fracture again noted. IMPRESSION: 1. Ongoing, mildly  increased body wall subcutaneous emphysema but no right pneumothorax visible now. 2. Right rib fractures better demonstrated by CT. No new cardiopulmonary abnormality. Electronically Signed   By: Marlise Simpers M.D.   On: 09/19/2023 06:31   ECHOCARDIOGRAM COMPLETE Result Date: 09/18/2023    ECHOCARDIOGRAM REPORT   Patient Name:   Ana Johnston Date of Exam: 09/18/2023 Medical Rec #:  [id number]    Height:       60.0 in Accession #:    [id number]   Weight:       147.0 lb Date of Birth:  [birth year]    BSA:          1.638 m Patient Age:    78 years     BP:           131/69 mmHg Patient Gender: F            HR:           66 bpm. Exam Location:  Inpatient Procedure: 2D Echo, Color Doppler, Cardiac Doppler and Intracardiac            Opacification Agent (Both Spectral and Color Flow Doppler were            utilized during procedure). Indications:    R55 Near-Syncope  History:        Patient has prior history of Echocardiogram examinations, most                 recent 05/01/2022. CAD, COPD; Risk Factors:Hypertension and                 Dyslipidemia.  Sonographer:    Sherline Distel Senior RDCS Referring Phys: [phone] DAVID MANUEL ORTIZ  Sonographer Comments: Technically difficult due to COPD, scanned supine due to broken ribs IMPRESSIONS  1. Left ventricular ejection fraction, by estimation, is 50 to 55%. The left ventricle has low normal function. The left ventricle demonstrates regional wall motion abnormalities (see scoring diagram/findings for description). Left ventricular diastolic  parameters are consistent with Grade I diastolic dysfunction (impaired relaxation).  2. Right ventricular systolic function is normal. The right ventricular size is normal.  3. The mitral valve is normal in structure. No evidence of mitral valve regurgitation. No evidence of mitral stenosis.  4. The aortic valve is tricuspid. Aortic valve regurgitation is not visualized. No aortic stenosis is present.  5. The inferior vena cava is normal in size with  greater than 50% respiratory variability, suggesting right atrial pressure of 3 mmHg. FINDINGS  Left Ventricle: Left ventricular ejection fraction, by estimation, is 50 to 55%. The left ventricle has low normal function. The left ventricle demonstrates regional wall motion abnormalities. Definity  contrast agent was given IV to delineate the left ventricular endocardial borders. The left ventricular internal cavity size was normal in size. There is no left  ventricular hypertrophy. Left ventricular diastolic parameters are consistent with Grade I diastolic dysfunction (impaired relaxation).  LV Wall Scoring: The basal inferoseptal segment is hypokinetic. Right Ventricle: The right ventricular size is normal. No increase in right ventricular wall thickness. Right ventricular systolic function is normal. Left Atrium: Left atrial size was normal in size. Right Atrium: Right atrial size was normal in size. Pericardium: There is no evidence of pericardial effusion. Mitral Valve: The mitral valve is normal in structure. No evidence of mitral valve regurgitation. No evidence of mitral valve stenosis. Tricuspid Valve: The tricuspid valve is normal in structure. Tricuspid valve regurgitation is not demonstrated. No evidence of tricuspid stenosis. Aortic Valve: The aortic valve is tricuspid. Aortic valve regurgitation is not visualized. No aortic stenosis is present. Pulmonic Valve: The pulmonic valve was normal in structure. Pulmonic valve regurgitation is not visualized. No evidence of pulmonic stenosis. Aorta: The aortic root and ascending aorta are structurally normal, with no evidence of dilitation. Venous: The inferior vena cava is normal in size with greater than 50% respiratory variability, suggesting right atrial pressure of 3 mmHg. IAS/Shunts: No atrial level shunt detected by color flow Doppler.  LEFT VENTRICLE PLAX 2D LVIDd:         4.40 cm   Diastology LVIDs:         3.40 cm   LV e' medial:    4.57 cm/s LV PW:          0.90 cm   LV E/e' medial:  12.7 LV IVS:        0.70 cm   LV e' lateral:   6.53 cm/s LVOT diam:     1.90 cm   LV E/e' lateral: 8.9 LV SV:         55 LV SV Index:   34 LVOT Area:     2.84 cm  RIGHT VENTRICLE RV S prime:     10.80 cm/s TAPSE (M-mode): 2.3 cm LEFT ATRIUM             Index        RIGHT ATRIUM           Index LA diam:        2.80 cm 1.71 cm/m   RA Area:     14.00 cm LA Vol (A2C):   47.4 ml 28.94 ml/m  RA Volume:   32.40 ml  19.78 ml/m LA Vol (A4C):   30.1 ml 18.38 ml/m LA Biplane Vol: 39.1 ml 23.88 ml/m  AORTIC VALVE LVOT Vmax:   104.00 cm/s LVOT Vmean:  70.800 cm/s LVOT VTI:    0.194 m  AORTA Ao Root diam: 3.10 cm Ao Asc diam:  3.50 cm MITRAL VALVE MV Area (PHT): 3.12 cm    SHUNTS MV Decel Time: 243 msec    Systemic VTI:  0.19 m MV E velocity: 58.10 cm/s  Systemic Diam: 1.90 cm MV A velocity: 88.80 cm/s MV E/A ratio:  0.65 Arta Lark Electronically signed by Arta Lark Signature Date/Time: 09/18/2023/3:41:45 PM    Final    DG CHEST PORT 1 VIEW Result Date: 09/18/2023 CLINICAL DATA:  Patient fell while walking to the bathroom. Right-sided rib pain. EXAM: PORTABLE CHEST 1 VIEW COMPARISON:  09/18/2023 FINDINGS: Small right apical pneumothorax has become more conspicuous in the interval. No substantial pleural effusion. Interstitial markings are diffusely coarsened with chronic features. The cardiopericardial silhouette is within normal limits for size. Multiple acute right rib fractures were better characterized on CT imaging earlier today. Old healed left-sided  rib fractures evident. Soft tissue gas noted in the right chest wall and lower neck. Telemetry leads overlie the chest. IMPRESSION: 1. Small right apical pneumothorax has become more conspicuous in the interval. 2. Multiple acute right rib fractures were better characterized on CT imaging earlier today. Electronically Signed   By: Donnal Fusi M.D.   On: 09/18/2023 07:35   CT CHEST ABDOMEN PELVIS W CONTRAST Addendum Date:  09/18/2023 ADDENDUM REPORT: 09/18/2023 05:59 ADDENDUM: Study discussed by telephone with Dr. Townsend Freud on 09/18/2023 at 0504 hours. Electronically Signed   By: Marlise Simpers M.D.   On: 09/18/2023 05:59   Result Date: 09/18/2023 CLINICAL DATA:  78 year old female status post fall while walking to bathroom. Right rib pain. EXAM: CT CHEST, ABDOMEN, AND PELVIS WITH CONTRAST TECHNIQUE: Multidetector CT imaging of the chest, abdomen and pelvis was performed following the standard protocol during bolus administration of intravenous contrast. RADIATION DOSE REDUCTION: This exam was performed according to the departmental dose-optimization program which includes automated exposure control, adjustment of the mA and/or kV according to patient size and/or use of iterative reconstruction technique. CONTRAST:  OMNIPAQUE  IOHEXOL  300 MG/ML  SOLN COMPARISON:  CTA chest 04/16/2023 and earlier. FINDINGS: CT CHEST FINDINGS Cardiovascular: Calcified aortic atherosclerosis. Calcified coronary artery atherosclerosis. Heart size remains normal. No pericardial effusion. Mediastinal vascular structures appear to remain intact. Mediastinum/Nodes: Negative. No mediastinal hematoma, gas, mediastinal mass or lymphadenopathy. Lungs/Pleura: Small to moderate right pneumothorax most apparent at the anterior costophrenic angle series 8, image 83. Major airways remain patent. Mild dependent atelectasis in both lungs. Asymmetric patchy right perihilar and peripheral right middle lobe lung opacity indeterminate for atelectasis versus contusion. Trace layering right pleural fluid. Subtle centrilobular emphysema suspected. No left-side pneumothorax, pleural effusion, or pulmonary contusion. Musculoskeletal: Displaced right lateral 7th rib fracture. Comminuted and severely displaced posterior right 8th rib fracture, with fracture fragments displaced greater than 1 cm. And a separate fracture of the lateral right 8th rib. See series 8, images 75  through 101. Comminuted right posterior 9th rib fracture. Separate fracture of the lateral 9th rib. Mildly displaced posterior right 10th rib fracture. Associated moderate to large volume of posttraumatic subcutaneous emphysema along the right posterolateral chest wall and flank. The visible shoulder osseous structures appear intact and aligned with chronic glenohumeral degeneration. No sternal fracture identified. Multilevel chronic but no acute left-sided rib fractures identified. Previously augmented chronic T12 compression fracture. No acute thoracic vertebral fracture identified. CT ABDOMEN PELVIS FINDINGS Hepatobiliary: Adjacent rib fractures, abnormal right lung base, and lower chest and abdominal wall posttraumatic subcutaneous gas but the liver appears to remain intact. No perihepatic fluid. 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 Pancreas: Negative. Spleen: Spleen appears intact.  No perisplenic fluid. Adrenals/Urinary Tract: Normal adrenal glands. Kidneys appears symmetric and intact with symmetric renal enhancement and contrast excretion. Unremarkable bladder. Incidental pelvic phleboliths. Stomach/Bowel: Nondilated large and small bowel with redundant large bowel intermittently containing gas. Cecum on a lax mesentery in the anterior abdomen with normal appendix on coronal image 105. No pneumoperitoneum. No free fluid. Small volume retained fluid in the stomach and proximal duodenum. Vascular/Lymphatic: Aortoiliac calcified atherosclerosis. Normal caliber abdominal aorta with tortuosity. Major arterial structures in the abdomen and pelvis remain patent. Portal venous system is patent. No lymphadenopathy identified. Reproductive: Absent uterus. Diminutive ovaries are within normal limits. Other: No pelvis free fluid. Musculoskeletal: Augmented T12 compression fracture and lumbar vertebrae appear stable since a 2023 CT Abdomen and Pelvis. Chronic lumbar degenerative spondylolisthesis and vacuum  disc. No lumbar,  sacral, pelvic, or proximal femur fracture identified. And no posttraumatic soft tissue gas below the L2 spinous process. IMPRESSION: 1. Positive for Acute, displaced fractures of the right ribs 7 through 10. Severely displaced right 8th rib fracture, and the right 8th and 9th ribs both fractured in two places. 2. Associated small to moderate Right Pneumothorax. Trace right hemothorax. Right perihilar and middle lobe lung opacity indeterminate for pulmonary contusion versus atelectasis. 3. Associated posttraumatic subcutaneous emphysema along the right chest wall and flank. 4. No other acute traumatic injury identified in the chest, abdomen, or pelvis. 5.  Aortic Atherosclerosis (ICD10-I70.0). Electronically Signed: By: Marlise Simpers M.D. On: 09/18/2023 04:58   DG Chest Port 1 View Result Date: 09/18/2023 CLINICAL DATA:  [id number].  Posttraumatic pneumothorax. EXAM: PORTABLE CHEST 1 VIEW COMPARISON:  Chest CT earlier today at 4:09 a.m. FINDINGS: 5:36 a.m. extensive right chest wall/supraclavicular fossa soft tissue emphysema again is noted. There is a small right medial apical pneumothorax. The CT also demonstrated anterior pneumothorax extending inferiorly to the chest base but this is not well seen AP. This has probably not notably changed. On CT it is estimated up to 20% of the chest volume. There is no increased contralateral mediastinal shift. There is hazy increased opacity in the right upper lobe and right base which is probably due to contusions. The left lung is clear with overall generally stable aeration, with low lung volumes. The cardiomediastinal silhouette and vasculature are normal. There is aortic atherosclerosis. There is right acromiohumeral abutment consistent with chronic rotator cuff arthropathy. Fractures of the right seventh through tenth ribs, some with comminution, are again noted. No new fracture has become apparent. Osteopenia. IMPRESSION: 1. Small right medial apical  pneumothorax. The CT also demonstrated anterior pneumothorax extending inferiorly to the chest base but this is not well seen AP. This has probably not notably changed. On CT it is estimated up to 20% of the chest volume. 2. Extensive right chest wall/supraclavicular fossa soft tissue emphysema. 3. Right seventh through tenth rib fractures, some with comminution. 4. Hazy increased opacity in the right upper lobe and right base which is probably due to contusions. 5. Aortic atherosclerosis. Electronically Signed   By: Denman Fischer M.D.   On: 09/18/2023 05:59   CT Cervical Spine Wo Contrast Result Date: 09/18/2023 CLINICAL DATA:  78 year old female status post fall while walking to bathroom. Right rib pain. EXAM: CT CERVICAL SPINE WITHOUT CONTRAST TECHNIQUE: Multidetector CT imaging of the cervical spine was performed without intravenous contrast. Multiplanar CT image reconstructions were also generated. RADIATION DOSE REDUCTION: This exam was performed according to the departmental dose-optimization program which includes automated exposure control, adjustment of the mA and/or kV according to patient size and/or use of iterative reconstruction technique. COMPARISON:  Head and chest CT today.  Neck MRA 12/12/2021. FINDINGS: Alignment: Straightening of cervical lordosis with mild degenerative appearing anterolisthesis of C4 on C5 (2 mm). Cervicomedullary junction is within normal limits. Bilateral posterior element alignment is within normal limits. Skull base and vertebrae: Bone mineralization is within normal limits for age. Visualized skull base is intact. No atlanto-occipital dissociation. C1 and C2 appear chronically degenerated but intact and aligned. No acute osseous abnormality identified in the cervical spine. Soft tissues and spinal canal: No prevertebral fluid or swelling. No visible canal hematoma. Necklace artifact. Retropharyngeal course of both carotids. Posttraumatic subcutaneous emphysema along  the right posterolateral neck tracking to the right chest and shoulder. Disc levels: Cervical spine degeneration superimposed on degenerative facet ankylosis which is  maximal at C3-C4 on the right. Bulky and partially calcified ligamentous hypertrophy about the odontoid. However, capacious CT appearance of the cervical spinal canal. Upper chest: Abnormal, see dedicated chest CT reported separately. IMPRESSION: 1. No acute traumatic injury identified in the cervical spine. 2. Posttraumatic subcutaneous emphysema appears to be tracking up from the chest into the right posterolateral neck. See Chest CT reported separately. 3. 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 Electronically Signed   By: Marlise Simpers M.D.   On: 09/18/2023 04:45   CT Head Wo Contrast Result Date: 09/18/2023 CLINICAL DATA:  78 year old female status post fall while walking to bathroom. Right rib pain. EXAM: CT HEAD WITHOUT CONTRAST TECHNIQUE: Contiguous axial images were obtained from the base of the skull through the vertex without intravenous contrast. RADIATION DOSE REDUCTION: This exam was performed according to the departmental dose-optimization program which includes automated exposure control, adjustment of the mA and/or kV according to patient size and/or use of iterative reconstruction technique. COMPARISON:  Brain MRI 12/12/2021.  Head CT 11/01/2019. FINDINGS: Brain: Stable cerebral volume. No midline shift, ventriculomegaly, mass effect, evidence of mass lesion, intracranial hemorrhage or evidence of cortically based acute infarction. Gray-white differentiation is stable and within normal limits for age. Vascular: Calcified atherosclerosis at the skull base. No suspicious intracranial vascular hyperdensity. Skull: Stable and intact.  No acute osseous abnormality identified. Sinuses/Orbits: Visualized paranasal sinuses and mastoids are stable and well aerated. Other: No acute orbit or scalp soft  tissue injury identified. IMPRESSION: 1.  No acute traumatic injury identified. 2. Negative for age noncontrast CT appearance of the brain. Electronically Signed   By: Marlise Simpers M.D.   On: 09/18/2023 04:42      [name] 09/19/2023  Patient ID: Emerson Hanly, female   DOB: 1945-10-29, 78 y.o.   MRN: [id number]

## 2023-09-19 NOTE — Progress Notes (Signed)
 Received pt to room 5N 14 @ 2020. Transported via stretcher. Pt alert and oriented x4. Skin warm and dry. Bruise noted at rt flank area. No acute distress at this time. Call light in reach. Sr x3 elevated. Bed in low position.

## 2023-09-19 NOTE — TOC Initial Note (Addendum)
 Transition of Care [facility]) - Initial/Assessment Note    Patient Details  Name: Ana Johnston MRN: [id number] Date of Birth: [birth year]  Transition of [facility]) CM/SW Contact:    Kebra Lowrimore M, RN Phone Number: 09/19/2023, 3:45 PM  Clinical Narrative:                 61 y fer old female admitted with right posterior chest wall pain following a mechanical fall at home. Found to have R 7-10 rib fractures, pneumothorax.  PTA, independent and living at home alone; son and daughter in law live nearby, and plan to provide needed assistance at discharge.  PT/OT recommending home health follow-up and patient/daughter-in-law agreeable to plan.  They have no preference to home health agency, as long as it is in network with insurance.  Referral to [facility] for continued therapies.  Referral to [employer] [facility] for recommended DME; rolling walker to be delivered to bedside prior to discharge.  Daughter in law, Katheren Palomino requests that [facility] agency call her to schedule appts at [phone].   Expected Discharge Plan: Home w Home Health Services Barriers to Discharge: Continued Medical Work up   Patient Goals and CMS Choice   CMS Medicare.gov Compare Post Acute Care list provided to:: Patient Choice offered to / list presented to : Patient, Adult Children      Expected Discharge Plan and Services   Discharge Planning Services: CM Consult Post Acute Care Choice: Home Health Living arrangements for the past 2 months: Single Family Home                 DME Arranged: Walker rolling DME Agency: [employer] Date DME Agency Contacted: 09/19/23 Time DME Agency Contacted: 1540 Representative spoke with at DME Agency: Lenon Radar HH Arranged: PT, OT HH Agency: [facility] Date [facility] Agency Contacted: 09/19/23 Time HH Agency Contacted: 1540 Representative spoke with at [facility] Agency: Marcina Severe  Prior Living Arrangements/Services Living arrangements for the past 2 months: Single Family  Home Lives with:: Self Patient language and need for interpreter reviewed:: Yes Do you feel safe going back to the place where you live?: Yes      Need for Family Participation in Patient Care: Yes (Comment) Care giver support system in place?: Yes (comment)   Criminal Activity/Legal Involvement Pertinent to Current Situation/Hospitalization: No - Comment as needed  Activities of Daily Living   ADL Screening (condition at time of admission) Independently performs ADLs?: Yes (appropriate for developmental age) Is the patient deaf or have difficulty hearing?: No Does the patient have difficulty seeing, even when wearing glasses/contacts?: No Does the patient have difficulty concentrating, remembering, or making decisions?: No  Permission Sought/Granted   Permission granted to share information with : Yes, Verbal Permission Granted  Share Information with NAME: Katheren Palomino  Permission granted to share info w AGENCY: [facility]  Permission granted to share info w Relationship: daughter  Permission granted to share info w Contact Information: [phone]  Emotional Assessment   Attitude/Demeanor/Rapport: Engaged Affect (typically observed): Accepting Orientation: : Oriented to Self, Oriented to Place, Oriented to Situation      Admission diagnosis:  Alcoholic intoxication without complication (HCC) [F10.920] Traumatic fracture of ribs with pneumothorax [S22.49XA, S27.0XXA] Closed traumatic fracture of ribs of right side with pneumothorax [S22.41XA, S27.0XXA] Patient Active Problem List   Diagnosis Date Noted   Fall at home 09/18/2023   Pneumothorax, right 09/18/2023   Former heavy cigarette smoker (20-39 per day) - quit 2021 09/18/2023  History of pulmonary embolus (PE) 09/18/2023   Personal history of multiple falls 09/18/2023   History of Clostridioides difficile colitis 09/18/2023   Subcutaneous emphysema due to trauma (HCC) 09/18/2023   Episodic lightheadedness 09/18/2023    Traumatic fracture of ribs with pneumothorax 09/18/2023   Hypokalemia 09/18/2023   Cervical spinal stenosis 05/16/2023   Traumatic fracture of ribs 7-10 of right side with pneumothorax 10/29/2022   Hyperlipidemia 08/21/2022   Coronary artery disease 08/21/2022   Generalized anxiety disorder 04/17/2022   Essential hypertension 04/09/2022   COPD (chronic obstructive pulmonary disease) (HCC) 04/09/2022   Family history of heart disease 04/09/2022   Nuclear sclerotic cataract of left eye 07/29/2014   Osteopenia of multiple sites (-1.01-2020) 06/10/2012   Primary osteoarthritis involving multiple joints 10/02/2011   PCP:  Mariel Shope, DO Pharmacy:   [id number] - [place], [place] - [address] [place] [address] Phone: [phone] Fax: [phone]     Social Drivers of Health (SDOH) Social History: SDOH Screenings   Food Insecurity: No Food Insecurity (09/19/2023)  Housing: Low Risk  (09/19/2023)  Transportation Needs: No Transportation Needs (09/19/2023)  Utilities: Not At Risk (09/19/2023)  Depression (PHQ2-9): Low Risk  (05/16/2023)  Financial Resource Strain: Low Risk  (05/10/2023)   Received from [facility]  Physical Activity: Unknown (05/10/2023)   Received from [facility]  Social Connections: Moderately Integrated (09/19/2023)  Stress: Patient Declined (05/10/2023)   Received from [facility]  Tobacco Use: Medium Risk (09/19/2023)   SDOH Interventions:     Readmission Risk Interventions     No data to display         Calla Catchings, RN, BSN  Trauma/Neuro ICU Case Manager [phone]

## 2023-09-19 NOTE — Plan of Care (Signed)
  Problem: Education: Goal: Knowledge of General Education information will improve Description: Including pain rating scale, medication(s)/side effects and non-pharmacologic comfort measures Outcome: Progressing   Problem: Clinical Measurements: Goal: Will remain free from infection Outcome: Progressing   Problem: Activity: Goal: Risk for activity intolerance will decrease Outcome: Progressing   Problem: Nutrition: Goal: Adequate nutrition will be maintained Outcome: Progressing   Problem: Pain Managment: Goal: General experience of comfort will improve and/or be controlled Outcome: Progressing   Problem: Safety: Goal: Ability to remain free from injury will improve Outcome: Progressing

## 2023-09-20 ENCOUNTER — Inpatient Hospital Stay (HOSPITAL_COMMUNITY)

## 2023-09-20 DIAGNOSIS — S270XXA Traumatic pneumothorax, initial encounter: Secondary | ICD-10-CM | POA: Diagnosis not present

## 2023-09-20 DIAGNOSIS — S2241XA Multiple fractures of ribs, right side, initial encounter for closed fracture: Secondary | ICD-10-CM | POA: Diagnosis not present

## 2023-09-20 NOTE — Progress Notes (Signed)
 Trauma Event Note   TRN rounds, Updated patient's home med list at her request with list brought in by daughter. Denies other needs, TRNs to continue to follow.     Cesar Alf O Kalonji Zurawski  Trauma Response RN  Please call TRN at [phone] for further assistance.

## 2023-09-20 NOTE — Progress Notes (Signed)
 Assessment & Plan: HD#2 - fall, right rib fractures 7-10, PTX CXR this AM with improvement in PTX, persistent SQ air Increased mobility, working with IS Pain control good   Patient appears stable at this time with improvement in pain.  Moving around room and to bathroom.  Crepitance posterior chest wall, not increased.  Will plan to repeat CXR in AM 4/20.  If continued improvement, home tomorrow or Monday.  Hold off on thoracostomy tube placement for now.        [name], MD [facility] practice Office: [phone]        Chief Complaint: Fall, rib fx's, PTX  Subjective: Patient comfortable in bed, has been up to chair.  Less pain.  Objective: Vital signs in last 24 hours: Temp:  [97.5 F (36.4 C)-98.7 F (37.1 C)] 98 F (36.7 C) (04/19 0928) Pulse Rate:  [72-85] 72 (04/19 0851) Resp:  [16-22] 22 (04/19 0928) BP: (112-138)/(57-76) 117/57 (04/19 0851) SpO2:  [92 %-96 %] 95 % (04/19 0851) Last BM Date : 09/16/23  Intake/Output from previous day: 04/18 0701 - 04/19 0700 In: 720 [P.O.:720] Out: -  Intake/Output this shift: Total I/O In: 360 [P.O.:360] Out: -   Physical Exam: HEENT - sclerae clear, mucous membranes moist Neck - soft, no crepitance Chest - ecchymosis low right chest wall; crepitance to palpation posterior right chest wall, decreased  Lab Results:  Recent Labs    09/18/23 0252 09/18/23 0302 09/19/23 0403  WBC 14.9*  --  10.3  HGB 14.8 15.6* 12.1  HCT 45.4 46.0 36.4  PLT 317  --  262   BMET Recent Labs    09/18/23 0252 09/18/23 0302 09/19/23 0403  NA 135 135 132*  K 3.3* 3.2* 4.0  CL 98 99 98  CO2 23  --  22  GLUCOSE 100* 106* 154*  BUN 14 13 13   CREATININE 0.50 0.90 1.24*  CALCIUM  9.3  --  9.3   PT/INR No results for input(s): "LABPROT", "INR" in the last 72 hours. Comprehensive Metabolic Panel:    Component Value Date/Time   NA 132 (L) 09/19/2023 0403   NA 135 09/18/2023 0302   NA 135 (A)  12/27/2021 0000   K 4.0 09/19/2023 0403   K 3.2 (L) 09/18/2023 0302   CL 98 09/19/2023 0403   CL 99 09/18/2023 0302   CO2 22 09/19/2023 0403   CO2 23 09/18/2023 0252   BUN 13 09/19/2023 0403   BUN 13 09/18/2023 0302   BUN 15 12/27/2021 0000   CREATININE 1.24 (H) 09/19/2023 0403   CREATININE 0.90 09/18/2023 0302   GLUCOSE 154 (H) 09/19/2023 0403   GLUCOSE 106 (H) 09/18/2023 0302   CALCIUM  9.3 09/19/2023 0403   CALCIUM  9.3 09/18/2023 0252   AST 36 09/18/2023 0252   AST 24 08/22/2022 1201   ALT 30 09/18/2023 0252   ALT 21 08/22/2022 1201   ALKPHOS 82 09/18/2023 0252   ALKPHOS 101 08/22/2022 1201   BILITOT 0.7 09/18/2023 0252   BILITOT 0.7 08/22/2022 1201   BILITOT 0.6 05/24/2022 1557   PROT 7.1 09/18/2023 0252   PROT 6.9 08/22/2022 1201   PROT 7.4 05/24/2022 1557   ALBUMIN 3.7 09/18/2023 0252   ALBUMIN 4.2 08/22/2022 1201   ALBUMIN 3.5 05/24/2022 1557    Studies/Results: DG Chest 2 View Result Date: 09/20/2023 CLINICAL DATA:  78 year old female status post fall with numerous right rib fractures, right pneumothorax, trace hemothorax, possible lung contusion on presentation 2 days  ago. EXAM: CHEST - 2 VIEW COMPARISON:  Portable chest yesterday and earlier. FINDINGS: AP and lateral views 0651 hours. Regressed but not resolved right body wall, supraclavicular subcutaneous gas. Mildly improved lung volumes and bilateral ventilation. Small residual right pneumothorax is visible underlying the lateral 2nd rib. No layering pleural fluid now. Curvilinear opacity in the right lower lobe. Mediastinal contours are stable and within normal limits. Stable left lung ventilation. Stable visualized osseous structures. Negative visible bowel gas. IMPRESSION: 1. Regressed but not resolved right side pneumothorax, chest wall subcutaneous gas. 2. Mildly improved lung volumes and ventilation since yesterday. Curvilinear atelectasis in the right lower lobe. Electronically Signed   By: Marlise Simpers M.D.   On:  09/20/2023 09:00   VAS US  CAROTID Result Date: 09/19/2023 Carotid Arterial Duplex Study Patient Name:  JEARLDEAN GUTT  Date of Exam:   09/19/2023 Medical Rec #: [id number]     Accession #:    [id number] Date of Birth: 1945-10-01     Patient Gender: F Patient Age:   78 years Exam Location:  [facility] Procedure:      VAS US  CAROTID Referring Phys: DAVID ORTIZ --------------------------------------------------------------------------------  Indications:       Syncope. Risk Factors:      Hypertension, hyperlipidemia. Comparison Study:  None. Performing Technologist: Estanislao Heimlich  Examination Guidelines: A complete evaluation includes B-mode imaging, spectral Doppler, color Doppler, and power Doppler as needed of all accessible portions of each vessel. Bilateral testing is considered an integral part of a complete examination. Limited examinations for reoccurring indications may be performed as noted.  Right Carotid Findings: +----------+--------+--------+--------+------------------+------------------+           PSV cm/sEDV cm/sStenosisPlaque DescriptionComments           +----------+--------+--------+--------+------------------+------------------+ CCA Prox  71      15                                intimal thickening +----------+--------+--------+--------+------------------+------------------+ CCA Distal59      17                                                   +----------+--------+--------+--------+------------------+------------------+ ICA Prox  58      18                                                   +----------+--------+--------+--------+------------------+------------------+ ICA Distal88      26                                                   +----------+--------+--------+--------+------------------+------------------+ ECA       78      12                                                    +----------+--------+--------+--------+------------------+------------------+ +----------+--------+-------+--------+-------------------+  PSV cm/sEDV cmsDescribeArm Pressure (mmHG) +----------+--------+-------+--------+-------------------+ [id number]                                        +----------+--------+-------+--------+-------------------+ +---------+--------+--+--------+--+ VertebralPSV cm/s44EDV cm/s11 +---------+--------+--+--------+--+  Left Carotid Findings: +----------+--------+--------+--------+------------------+------------------+           PSV cm/sEDV cm/sStenosisPlaque DescriptionComments           +----------+--------+--------+--------+------------------+------------------+ CCA Prox  84      25                                intimal thickening +----------+--------+--------+--------+------------------+------------------+ CCA Distal87      20                                intimal thickening +----------+--------+--------+--------+------------------+------------------+ ICA Prox  65      15                                                   +----------+--------+--------+--------+------------------+------------------+ ICA Distal109     39                                                   +----------+--------+--------+--------+------------------+------------------+ ECA       74      12                                                   +----------+--------+--------+--------+------------------+------------------+ +----------+--------+--------+--------+-------------------+           PSV cm/sEDV cm/sDescribeArm Pressure (mmHG) +----------+--------+--------+--------+-------------------+ [id number]                                         +----------+--------+--------+--------+-------------------+ +---------+--------+---+--------+--+ VertebralPSV cm/s106EDV cm/s21 +---------+--------+---+--------+--+   Summary: Right  Carotid: There is no evidence of stenosis in the right ICA. The                extracranial vessels were near-normal with only minimal wall                thickening or plaque. Left Carotid: There is no evidence of stenosis in the left ICA. The extracranial               vessels were near-normal with only minimal wall thickening or               plaque. Vertebrals:  Bilateral vertebral arteries demonstrate antegrade flow. Subclavians: Normal flow hemodynamics were seen in bilateral subclavian              arteries. *See table(s) above for measurements and observations.     Preliminary    DG CHEST PORT 1 VIEW Result Date: 09/19/2023 CLINICAL DATA:  78 year old female with fall with right rib fractures, right pneumothorax. EXAM: PORTABLE CHEST 1 VIEW COMPARISON:  Chest CT and radiographs yesterday. FINDINGS: Portable AP semi upright view at 0607 hours. Mildly improved lung volumes. Mediastinal contours are stable and within normal limits. No right pneumothorax now visible. But ongoing moderate right chest wall, abdominal wall, and supraclavicular subcutaneous gas which has mildly increased. Right rib fractures better demonstrated by CT. No confluent lung opacity now. Visualized tracheal air column is within normal limits. Chronic left rib fractures. Left lung appears negative. Nonobstructed visible bowel gas pattern. Augmented T12 compression fracture again noted. IMPRESSION: 1. Ongoing, mildly increased body wall subcutaneous emphysema but no right pneumothorax visible now. 2. Right rib fractures better demonstrated by CT. No new cardiopulmonary abnormality. Electronically Signed   By: Marlise Simpers M.D.   On: 09/19/2023 06:31   ECHOCARDIOGRAM COMPLETE Result Date: 09/18/2023    ECHOCARDIOGRAM REPORT   Patient Name:   KELVIN BURPEE Date of Exam: 09/18/2023 Medical Rec #:  [id number]    Height:       60.0 in Accession #:    [id number]   Weight:       147.0 lb Date of Birth:  Nov 01, 1945    BSA:          1.638 m Patient  Age:    78 years     BP:           131/69 mmHg Patient Gender: F            HR:           66 bpm. Exam Location:  Inpatient Procedure: 2D Echo, Color Doppler, Cardiac Doppler and Intracardiac            Opacification Agent (Both Spectral and Color Flow Doppler were            utilized during procedure). Indications:    R55 Near-Syncope  History:        Patient has prior history of Echocardiogram examinations, most                 recent 05/01/2022. CAD, COPD; Risk Factors:Hypertension and                 Dyslipidemia.  Sonographer:    Sherline Distel Senior RDCS Referring Phys: [id number] DAVID MANUEL ORTIZ  Sonographer Comments: Technically difficult due to COPD, scanned supine due to broken ribs IMPRESSIONS  1. Left ventricular ejection fraction, by estimation, is 50 to 55%. The left ventricle has low normal function. The left ventricle demonstrates regional wall motion abnormalities (see scoring diagram/findings for description). Left ventricular diastolic  parameters are consistent with Grade I diastolic dysfunction (impaired relaxation).  2. Right ventricular systolic function is normal. The right ventricular size is normal.  3. The mitral valve is normal in structure. No evidence of mitral valve regurgitation. No evidence of mitral stenosis.  4. The aortic valve is tricuspid. Aortic valve regurgitation is not visualized. No aortic stenosis is present.  5. The inferior vena cava is normal in size with greater than 50% respiratory variability, suggesting right atrial pressure of 3 mmHg. FINDINGS  Left Ventricle: Left ventricular ejection fraction, by estimation, is 50 to 55%. The left ventricle has low normal function. The left ventricle demonstrates regional wall motion abnormalities. Definity  contrast agent was given IV to delineate the left ventricular endocardial borders. The left ventricular internal cavity size was normal in size. There is no left ventricular hypertrophy. Left ventricular diastolic parameters are  consistent with Grade I diastolic dysfunction (impaired relaxation).  LV Wall Scoring: The basal inferoseptal segment is hypokinetic. Right  Ventricle: The right ventricular size is normal. No increase in right ventricular wall thickness. Right ventricular systolic function is normal. Left Atrium: Left atrial size was normal in size. Right Atrium: Right atrial size was normal in size. Pericardium: There is no evidence of pericardial effusion. Mitral Valve: The mitral valve is normal in structure. No evidence of mitral valve regurgitation. No evidence of mitral valve stenosis. Tricuspid Valve: The tricuspid valve is normal in structure. Tricuspid valve regurgitation is not demonstrated. No evidence of tricuspid stenosis. Aortic Valve: The aortic valve is tricuspid. Aortic valve regurgitation is not visualized. No aortic stenosis is present. Pulmonic Valve: The pulmonic valve was normal in structure. Pulmonic valve regurgitation is not visualized. No evidence of pulmonic stenosis. Aorta: The aortic root and ascending aorta are structurally normal, with no evidence of dilitation. Venous: The inferior vena cava is normal in size with greater than 50% respiratory variability, suggesting right atrial pressure of 3 mmHg. IAS/Shunts: No atrial level shunt detected by color flow Doppler.  LEFT VENTRICLE PLAX 2D LVIDd:         4.40 cm   Diastology LVIDs:         3.40 cm   LV e' medial:    4.57 cm/s LV PW:         0.90 cm   LV E/e' medial:  12.7 LV IVS:        0.70 cm   LV e' lateral:   6.53 cm/s LVOT diam:     1.90 cm   LV E/e' lateral: 8.9 LV SV:         55 LV SV Index:   34 LVOT Area:     2.84 cm  RIGHT VENTRICLE RV S prime:     10.80 cm/s TAPSE (M-mode): 2.3 cm LEFT ATRIUM             Index        RIGHT ATRIUM           Index LA diam:        2.80 cm 1.71 cm/m   RA Area:     14.00 cm LA Vol (A2C):   47.4 ml 28.94 ml/m  RA Volume:   32.40 ml  19.78 ml/m LA Vol (A4C):   30.1 ml 18.38 ml/m LA Biplane Vol: 39.1 ml 23.88  ml/m  AORTIC VALVE LVOT Vmax:   104.00 cm/s LVOT Vmean:  70.800 cm/s LVOT VTI:    0.194 m  AORTA Ao Root diam: 3.10 cm Ao Asc diam:  3.50 cm MITRAL VALVE MV Area (PHT): 3.12 cm    SHUNTS MV Decel Time: 243 msec    Systemic VTI:  0.19 m MV E velocity: 58.10 cm/s  Systemic Diam: 1.90 cm MV A velocity: 88.80 cm/s MV E/A ratio:  0.65 Arta Lark Electronically signed by Arta Lark Signature Date/Time: 09/18/2023/3:41:45 PM    Final       [name] 09/20/2023  Patient ID: Emerson Hanly, female   DOB: 11-19-1945, 78 y.o.   MRN: [id number]

## 2023-09-20 NOTE — Plan of Care (Signed)

## 2023-09-20 NOTE — Progress Notes (Signed)
 PROGRESS NOTE    Ana Johnston  ZOX:[id number] DOB: 1945/09/03 DOA: 09/18/2023 PCP: Mariel Shope, DO   Brief Narrative:  Azucena Dart is a 78 y.o. female with past medical history of pulmonary embolism, arthritis, atypical chest pain, CAD, COPD, hyperlipidemia, cervical spinal stenosis, C. difficile colitis, GAD, depression, diverticulosis, diverticulitis, dyspnea on exertion, hypertension, seasonal allergies who was admitted by general surgery trauma team due to having a fall at home resulting in multiple right rib fractures complicated by subcutaneous emphysema and hemo/pneumothorax.   Assessment & Plan:   Principal Problem:   Traumatic fracture of ribs 7-10 of right side with pneumothorax Active Problems:   Essential hypertension   COPD (chronic obstructive pulmonary disease) (HCC)   Generalized anxiety disorder   Hyperlipidemia   Coronary artery disease   Osteopenia of multiple sites (-1.01-2020)   Fall at home   Pneumothorax, right   Former heavy cigarette smoker (20-39 per day) - quit 2021   History of pulmonary embolus (PE)   Personal history of multiple falls   Subcutaneous emphysema due to trauma [facility])   Episodic lightheadedness   Traumatic fracture of ribs with pneumothorax   Hypokalemia   Acute hypoxic respiratory failure COPD (chronic obstructive pulmonary disease) (HCC) not in acute exacerbation Secondary to rib fractures/trauma (see below) Wean oxygen as tolerated, walk screen pending Continue incentive spirometry and flutter Continue Breztri  2 puffs twice daily/albuterol  nebs. Former heavy cigarette smoker (20-39 per day) - quit 2021  AKI, mild/borderline - Likely secondary to poor p.o. intake in the setting of above, continue increased free water intake  Essential hypertension Well-controlled off medications  Generalized anxiety disorder Continue buspirone , sertraline   Hyperlipidemia Continue atorvastatin   Coronary artery disease Carotid doppler  unremarkable for stenosis bilaterally Continue statin Beta-blocker on hold   Osteopenia of multiple sites (-1.01-2020) Follow-up with PCP.   History of pulmonary embolus (PE) Continue DVT prophylaxis with enoxaparin .   Episodic lightheadedness Will check echocardiogram and carotid Doppler.   Hypokalemia  Supplemented orally. Also received magnesium  sulfate.  Ambulatory dysfunction with multiple falls at home Traumatic fracture of ribs 7-10 of right side Pneumothorax, right Subcutaneous emphysema due to trauma [facility]) Continue current treatment measures per trauma team, no indication for chest tube Repeat chest x-ray in the morning PT/OT to eval and follow   DVT prophylaxis: enoxaparin  (LOVENOX ) injection 30 mg Start: 09/19/23 1000 SCDs Start: 09/18/23 0702 Code Status:   Code Status: Full Code Family Communication: None present  Status is: Inpatient  Dispo: The patient is from: Home              Anticipated d/c is to: To be determined              Anticipated d/c date is: Per primary likely 24 to 48 hours              Patient currently not medically stable for discharge  Consultants:  We are, trauma primary  Procedures:  None  Antimicrobials:  None  Subjective: No acute issues or events overnight, pain currently well-controlled, respiratory status improving but not yet back to baseline  Objective: Vitals:   09/19/23 1544 09/19/23 1953 09/20/23 0528 09/20/23 0619  BP: 138/70 112/76  129/75  Pulse: 85 81    Resp: 16 17    Temp: 97.7 F (36.5 C) 98.7 F (37.1 C) 97.9 F (36.6 C)   TempSrc:  Oral Oral   SpO2: 92% 92%  96%  Weight:      Height:  Intake/Output Summary (Last 24 hours) at 09/20/2023 0737 Last data filed at 09/19/2023 1700 Gross per 24 hour  Intake 720 ml  Output --  Net 720 ml   Filed Weights   09/18/23 0156  Weight: 66.7 kg    Examination:  General:  Pleasantly resting in bed, No acute distress. HEENT:  Normocephalic  atraumatic.  Sclerae nonicteric, noninjected.  Extraocular movements intact bilaterally. Neck:  Without mass or deformity.  Trachea is midline. Lungs: Diminished without overt rhonchi, wheeze, or rales. Heart:  Regular rate and rhythm.  Without murmurs, rubs, or gallops. Abdomen:  Soft, nontender, nondistended.  Without guarding or rebound. Extremities: Without cyanosis, clubbing, edema, or obvious deformity. Skin:  Warm and dry, no erythema.  Data Reviewed: I have personally reviewed following labs and imaging studies  CBC: Recent Labs  Lab 09/18/23 0252 09/18/23 0302 09/19/23 0403  WBC 14.9*  --  10.3  NEUTROABS 12.1*  --   --   HGB 14.8 15.6* 12.1  HCT 45.4 46.0 36.4  MCV 96.4  --  95.5  PLT 317  --  262   Basic Metabolic Panel: Recent Labs  Lab 09/18/23 0252 09/18/23 0302 09/19/23 0403  NA 135 135 132*  K 3.3* 3.2* 4.0  CL 98 99 98  CO2 23  --  22  GLUCOSE 100* 106* 154*  BUN 14 13 13   CREATININE 0.50 0.90 1.24*  CALCIUM  9.3  --  9.3  MG 1.8  --   --   PHOS 2.7  --   --    GFR: Estimated Creatinine Clearance: 31.9 mL/min (A) (by C-G formula based on SCr of 1.24 mg/dL (H)).  Liver Function Tests: Recent Labs  Lab 09/18/23 0252  AST 36  ALT 30  ALKPHOS 82  BILITOT 0.7  PROT 7.1  ALBUMIN 3.7   No results found for this or any previous visit (from the past 240 hours).   Radiology Studies: VAS US  CAROTID Result Date: 09/19/2023 Carotid Arterial Duplex Study Patient Name:  Ana Johnston  Date of Exam:   09/19/2023 Medical Rec #: [id number]     Accession #:    [id number] Date of Birth: 08/09/1945     Patient Gender: F Patient Age:   78 years Exam Location:  [facility] Procedure:      VAS US  CAROTID Referring Phys: DAVID ORTIZ --------------------------------------------------------------------------------  Indications:       Syncope. Risk Factors:      Hypertension, hyperlipidemia. Comparison Study:  None. Performing Technologist: Estanislao Heimlich  Examination  Guidelines: A complete evaluation includes B-mode imaging, spectral Doppler, color Doppler, and power Doppler as needed of all accessible portions of each vessel. Bilateral testing is considered an integral part of a complete examination. Limited examinations for reoccurring indications may be performed as noted.  Right Carotid Findings: +----------+--------+--------+--------+------------------+------------------+           PSV cm/sEDV cm/sStenosisPlaque DescriptionComments           +----------+--------+--------+--------+------------------+------------------+ CCA Prox  71      15                                intimal thickening +----------+--------+--------+--------+------------------+------------------+ CCA Distal59      17                                                   +----------+--------+--------+--------+------------------+------------------+  ICA Prox  58      18                                                   +----------+--------+--------+--------+------------------+------------------+ ICA Distal88      26                                                   +----------+--------+--------+--------+------------------+------------------+ ECA       78      12                                                   +----------+--------+--------+--------+------------------+------------------+ +----------+--------+-------+--------+-------------------+           PSV cm/sEDV cmsDescribeArm Pressure (mmHG) +----------+--------+-------+--------+-------------------+ [id number]                                        +----------+--------+-------+--------+-------------------+ +---------+--------+--+--------+--+ VertebralPSV cm/s44EDV cm/s11 +---------+--------+--+--------+--+  Left Carotid Findings: +----------+--------+--------+--------+------------------+------------------+           PSV cm/sEDV cm/sStenosisPlaque DescriptionComments            +----------+--------+--------+--------+------------------+------------------+ CCA Prox  84      25                                intimal thickening +----------+--------+--------+--------+------------------+------------------+ CCA Distal87      20                                intimal thickening +----------+--------+--------+--------+------------------+------------------+ ICA Prox  65      15                                                   +----------+--------+--------+--------+------------------+------------------+ ICA Distal109     39                                                   +----------+--------+--------+--------+------------------+------------------+ ECA       74      12                                                   +----------+--------+--------+--------+------------------+------------------+ +----------+--------+--------+--------+-------------------+           PSV cm/sEDV cm/sDescribeArm Pressure (mmHG) +----------+--------+--------+--------+-------------------+ [id number]                                         +----------+--------+--------+--------+-------------------+ +---------+--------+---+--------+--+  VertebralPSV cm/s106EDV cm/s21 +---------+--------+---+--------+--+   Summary: Right Carotid: There is no evidence of stenosis in the right ICA. The                extracranial vessels were near-normal with only minimal wall                thickening or plaque. Left Carotid: There is no evidence of stenosis in the left ICA. The extracranial               vessels were near-normal with only minimal wall thickening or               plaque. Vertebrals:  Bilateral vertebral arteries demonstrate antegrade flow. Subclavians: Normal flow hemodynamics were seen in bilateral subclavian              arteries. *See table(s) above for measurements and observations.     Preliminary    DG CHEST PORT 1 VIEW Result Date: 09/19/2023 CLINICAL DATA:   78 year old female with fall with right rib fractures, right pneumothorax. EXAM: PORTABLE CHEST 1 VIEW COMPARISON:  Chest CT and radiographs yesterday. FINDINGS: Portable AP semi upright view at 0607 hours. Mildly improved lung volumes. Mediastinal contours are stable and within normal limits. No right pneumothorax now visible. But ongoing moderate right chest wall, abdominal wall, and supraclavicular subcutaneous gas which has mildly increased. Right rib fractures better demonstrated by CT. No confluent lung opacity now. Visualized tracheal air column is within normal limits. Chronic left rib fractures. Left lung appears negative. Nonobstructed visible bowel gas pattern. Augmented T12 compression fracture again noted. IMPRESSION: 1. Ongoing, mildly increased body wall subcutaneous emphysema but no right pneumothorax visible now. 2. Right rib fractures better demonstrated by CT. No new cardiopulmonary abnormality. Electronically Signed   By: Marlise Simpers M.D.   On: 09/19/2023 06:31   ECHOCARDIOGRAM COMPLETE Result Date: 09/18/2023    ECHOCARDIOGRAM REPORT   Patient Name:   Ana Johnston Date of Exam: 09/18/2023 Medical Rec #:  [id number]    Height:       60.0 in Accession #:    [id number]   Weight:       147.0 lb Date of Birth:  1945/06/27    BSA:          1.638 m Patient Age:    78 years     BP:           131/69 mmHg Patient Gender: F            HR:           66 bpm. Exam Location:  Inpatient Procedure: 2D Echo, Color Doppler, Cardiac Doppler and Intracardiac            Opacification Agent (Both Spectral and Color Flow Doppler were            utilized during procedure). Indications:    R55 Near-Syncope  History:        Patient has prior history of Echocardiogram examinations, most                 recent 05/01/2022. CAD, COPD; Risk Factors:Hypertension and                 Dyslipidemia.  Sonographer:    Sherline Distel Senior RDCS Referring Phys: [phone] DAVID MANUEL ORTIZ  Sonographer Comments: Technically difficult due to COPD,  scanned supine due to broken ribs IMPRESSIONS  1. Left ventricular ejection fraction, by estimation, is 50 to 55%. The left ventricle has  low normal function. The left ventricle demonstrates regional wall motion abnormalities (see scoring diagram/findings for description). Left ventricular diastolic  parameters are consistent with Grade I diastolic dysfunction (impaired relaxation).  2. Right ventricular systolic function is normal. The right ventricular size is normal.  3. The mitral valve is normal in structure. No evidence of mitral valve regurgitation. No evidence of mitral stenosis.  4. The aortic valve is tricuspid. Aortic valve regurgitation is not visualized. No aortic stenosis is present.  5. The inferior vena cava is normal in size with greater than 50% respiratory variability, suggesting right atrial pressure of 3 mmHg. FINDINGS  Left Ventricle: Left ventricular ejection fraction, by estimation, is 50 to 55%. The left ventricle has low normal function. The left ventricle demonstrates regional wall motion abnormalities. Definity  contrast agent was given IV to delineate the left ventricular endocardial borders. The left ventricular internal cavity size was normal in size. There is no left ventricular hypertrophy. Left ventricular diastolic parameters are consistent with Grade I diastolic dysfunction (impaired relaxation).  LV Wall Scoring: The basal inferoseptal segment is hypokinetic. Right Ventricle: The right ventricular size is normal. No increase in right ventricular wall thickness. Right ventricular systolic function is normal. Left Atrium: Left atrial size was normal in size. Right Atrium: Right atrial size was normal in size. Pericardium: There is no evidence of pericardial effusion. Mitral Valve: The mitral valve is normal in structure. No evidence of mitral valve regurgitation. No evidence of mitral valve stenosis. Tricuspid Valve: The tricuspid valve is normal in structure. Tricuspid valve  regurgitation is not demonstrated. No evidence of tricuspid stenosis. Aortic Valve: The aortic valve is tricuspid. Aortic valve regurgitation is not visualized. No aortic stenosis is present. Pulmonic Valve: The pulmonic valve was normal in structure. Pulmonic valve regurgitation is not visualized. No evidence of pulmonic stenosis. Aorta: The aortic root and ascending aorta are structurally normal, with no evidence of dilitation. Venous: The inferior vena cava is normal in size with greater than 50% respiratory variability, suggesting right atrial pressure of 3 mmHg. IAS/Shunts: No atrial level shunt detected by color flow Doppler.  LEFT VENTRICLE PLAX 2D LVIDd:         4.40 cm   Diastology LVIDs:         3.40 cm   LV e' medial:    4.57 cm/s LV PW:         0.90 cm   LV E/e' medial:  12.7 LV IVS:        0.70 cm   LV e' lateral:   6.53 cm/s LVOT diam:     1.90 cm   LV E/e' lateral: 8.9 LV SV:         55 LV SV Index:   34 LVOT Area:     2.84 cm  RIGHT VENTRICLE RV S prime:     10.80 cm/s TAPSE (M-mode): 2.3 cm LEFT ATRIUM             Index        RIGHT ATRIUM           Index LA diam:        2.80 cm 1.71 cm/m   RA Area:     14.00 cm LA Vol (A2C):   47.4 ml 28.94 ml/m  RA Volume:   32.40 ml  19.78 ml/m LA Vol (A4C):   30.1 ml 18.38 ml/m LA Biplane Vol: 39.1 ml 23.88 ml/m  AORTIC VALVE LVOT Vmax:   104.00 cm/s LVOT Vmean:  70.800  cm/s LVOT VTI:    0.194 m  AORTA Ao Root diam: 3.10 cm Ao Asc diam:  3.50 cm MITRAL VALVE MV Area (PHT): 3.12 cm    SHUNTS MV Decel Time: 243 msec    Systemic VTI:  0.19 m MV E velocity: 58.10 cm/s  Systemic Diam: 1.90 cm MV A velocity: 88.80 cm/s MV E/A ratio:  0.65 Arta Lark Electronically signed by Arta Lark Signature Date/Time: 09/18/2023/3:41:45 PM    Final    Scheduled Meds:  atorvastatin   20 mg Oral Daily   budeson-glycopyrrolate -formoterol   2 puff Inhalation BID   budesonide   6 mg Oral Daily   busPIRone   5 mg Oral BID   calcium -vitamin D  1 tablet Oral Q  breakfast   vitamin B-12  1,000 mcg Oral Daily   docusate sodium   100 mg Oral BID   enoxaparin  (LOVENOX ) injection  30 mg Subcutaneous Q12H   lidocaine   2 patch Transdermal Q24H   methocarbamol   500 mg Oral Q8H   Or   methocarbamol  (ROBAXIN ) injection  500 mg Intravenous Q8H   montelukast   10 mg Oral Daily   naproxen   250 mg Oral TID WC   polycarbophil  625 mg Oral BID   sertraline   50 mg Oral Daily   Continuous Infusions:     LOS: 2 days   Time spent:  Haydee Lipa, DO [facility]  If 7PM-7AM, please contact night-coverage [email or web address]  09/20/2023, 7:37 AM

## 2023-09-21 ENCOUNTER — Inpatient Hospital Stay (HOSPITAL_COMMUNITY)

## 2023-09-21 DIAGNOSIS — S2241XA Multiple fractures of ribs, right side, initial encounter for closed fracture: Secondary | ICD-10-CM | POA: Diagnosis not present

## 2023-09-21 DIAGNOSIS — S270XXA Traumatic pneumothorax, initial encounter: Secondary | ICD-10-CM | POA: Diagnosis not present

## 2023-09-21 LAB — BASIC METABOLIC PANEL WITH GFR
Anion gap: 9 (ref 5–15)
BUN: 9 mg/dL (ref 8–23)
CO2: 27 mmol/L (ref 22–32)
Calcium: 10.2 mg/dL (ref 8.9–10.3)
Chloride: 100 mmol/L (ref 98–111)
Creatinine, Ser: 0.76 mg/dL (ref 0.44–1.00)
GFR, Estimated: >60 mL/min (ref >60)
Glucose, Bld: 108 mg/dL — ABNORMAL HIGH (ref 70–99)
Potassium: 4.5 mmol/L (ref 3.5–5.1)
Sodium: 136 mmol/L (ref 135–145)

## 2023-09-21 MED ORDER — POTASSIUM CHLORIDE CRYS ER 20 MEQ PO TBCR
20.0000 meq | EXTENDED_RELEASE_TABLET | Freq: Every day | ORAL | Status: DC
Start: 2023-09-21 — End: 2023-09-23
  Administered 2023-09-21 – 2023-09-22 (×2): 20 meq via ORAL
  Filled 2023-09-21 (×2): qty 1

## 2023-09-21 NOTE — Plan of Care (Signed)

## 2023-09-21 NOTE — Progress Notes (Signed)
   09/21/23 1108  Mobility  Activity Ambulated with assistance in hallway  Level of Assistance Contact guard assist, steadying assist  Assistive Device Front wheel walker  Distance Ambulated (ft) 175 ft  Activity Response Tolerated fair  Mobility Referral Yes  Mobility visit 1 Mobility  Mobility Specialist Start Time (ACUTE ONLY) 1108  Mobility Specialist Stop Time (ACUTE ONLY) 1120  Mobility Specialist Time Calculation (min) (ACUTE ONLY) 12 min   Mobility Specialist: Progress Note  Pre-Mobility:      HR 70, SpO2 99% RA Post-Mobility:    HR  94, SpO2 100%, RA  Pt agreeable to mobility session - received in chair. C/o rib pain rated 8.5/10. Returned to chair with all needs met - call bell within reach. Chair alarm on.   Isla Mari, BS Mobility Specialist Please contact via SecureChat or  Rehab office at [phone].

## 2023-09-21 NOTE — Progress Notes (Signed)
 PROGRESS NOTE    Ana Johnston  VQQ:[id number] DOB: [birth year] DOA: 09/18/2023 PCP: Mariel Shope, DO   Brief Narrative:  Ana Johnston is a 78 y.o. female with past medical history of pulmonary embolism, arthritis, atypical chest pain, CAD, COPD, hyperlipidemia, cervical spinal stenosis, C. difficile colitis, GAD, depression, diverticulosis, diverticulitis, dyspnea on exertion, hypertension, seasonal allergies who was admitted by general surgery trauma team due to having a fall at home resulting in multiple right rib fractures complicated by subcutaneous emphysema and hemo/pneumothorax.   Assessment & Plan:   Principal Problem:   Traumatic fracture of ribs 7-10 of right side with pneumothorax Active Problems:   Essential hypertension   COPD (chronic obstructive pulmonary disease) (HCC)   Generalized anxiety disorder   Hyperlipidemia   Coronary artery disease   Osteopenia of multiple sites (-1.01-2020)   Fall at home   Pneumothorax, right   Former heavy cigarette smoker (20-39 per day) - quit 2021   History of pulmonary embolus (PE)   Personal history of multiple falls   Subcutaneous emphysema due to trauma [facility])   Episodic lightheadedness   Traumatic fracture of ribs with pneumothorax   Hypokalemia   Acute hypoxic respiratory failure COPD (chronic obstructive pulmonary disease) (HCC) not in acute exacerbation Secondary to rib fractures/trauma (see below) Currently on room air Continue incentive spirometry and flutter Continue Breztri  2 puffs twice daily/albuterol  nebs. Former heavy cigarette smoker (20-39 per day) - quit 2021  AKI, resolved - Likely secondary to poor p.o. intake in the setting of above, continue increased free water intake  Essential hypertension Well-controlled off medications  Generalized anxiety disorder Continue buspirone , sertraline   Hyperlipidemia Continue atorvastatin   Coronary artery disease Carotid doppler unremarkable for stenosis  bilaterally Continue statin Beta-blocker on hold   Osteopenia of multiple sites (-1.01-2020) Follow-up with PCP.   History of pulmonary embolus (PE) Continue DVT prophylaxis with enoxaparin .   Episodic lightheadedness Will check echocardiogram and carotid Doppler.   Hypokalemia, resolved Resume home potassium  Ambulatory dysfunction with multiple falls at home Traumatic fracture of ribs 7-10 of right side Pneumothorax, right Subcutaneous emphysema due to trauma [facility]) Continue current treatment measures per trauma team, no indication for chest tube Repeat chest x-ray in the morning PT/OT to eval and follow   DVT prophylaxis: enoxaparin  (LOVENOX ) injection 30 mg Start: 09/19/23 1000 SCDs Start: 09/18/23 0702 Code Status:   Code Status: Full Code Family Communication: None present  Status is: Inpatient  Dispo: The patient is from: Home              Anticipated d/c is to: To be determined              Anticipated d/c date is: Per primary likely 24 to 48 hours              Patient currently not medically stable for discharge  Consultants:  We are, trauma primary  Procedures:  None  Antimicrobials:  None  Subjective: No acute issues or events overnight, pain currently well-controlled, respiratory status improving  -approaching baseline  Objective: Vitals:   09/20/23 1550 09/20/23 1600 09/20/23 2048 09/21/23 0500  BP: 127/84  130/69 119/63  Pulse: 67  66 69  Resp: (!) 25 17 18 15   Temp: 98.2 F (36.8 C)  97.8 F (36.6 C) 98 F (36.7 C)  TempSrc: Oral   Oral  SpO2: 92%  94% 97%  Weight:      Height:        Intake/Output Summary (Last 24  hours) at 09/21/2023 0713 Last data filed at 09/21/2023 0600 Gross per 24 hour  Intake 560 ml  Output --  Net 560 ml   Filed Weights   09/18/23 0156  Weight: 66.7 kg    Examination:  General:  Pleasantly resting in bed, No acute distress. HEENT:  Normocephalic atraumatic.  Sclerae nonicteric, noninjected.   Extraocular movements intact bilaterally. Neck:  Without mass or deformity.  Trachea is midline. Lungs: Diminished without overt rhonchi, wheeze, or rales. Heart:  Regular rate and rhythm.  Without murmurs, rubs, or gallops. Abdomen:  Soft, nontender, nondistended.  Without guarding or rebound. Extremities: Without cyanosis, clubbing, edema, or obvious deformity. Skin:  Warm and dry, no erythema.  Data Reviewed: I have personally reviewed following labs and imaging studies  CBC: Recent Labs  Lab 09/18/23 0252 09/18/23 0302 09/19/23 0403  WBC 14.9*  --  10.3  NEUTROABS 12.1*  --   --   HGB 14.8 15.6* 12.1  HCT 45.4 46.0 36.4  MCV 96.4  --  95.5  PLT 317  --  262   Basic Metabolic Panel: Recent Labs  Lab 09/18/23 0252 09/18/23 0302 09/19/23 0403  NA 135 135 132*  K 3.3* 3.2* 4.0  CL 98 99 98  CO2 23  --  22  GLUCOSE 100* 106* 154*  BUN 14 13 13   CREATININE 0.50 0.90 1.24*  CALCIUM  9.3  --  9.3  MG 1.8  --   --   PHOS 2.7  --   --    GFR: Estimated Creatinine Clearance: 31.9 mL/min (A) (by C-G formula based on SCr of 1.24 mg/dL (H)).  Liver Function Tests: Recent Labs  Lab 09/18/23 0252  AST 36  ALT 30  ALKPHOS 82  BILITOT 0.7  PROT 7.1  ALBUMIN 3.7   No results found for this or any previous visit (from the past 240 hours).   Radiology Studies: VAS US  CAROTID Result Date: 09/20/2023 Carotid Arterial Duplex Study Patient Name:  Ana Johnston  Date of Exam:   09/19/2023 Medical Rec #: [id number]     Accession #:    [id number] Date of Birth: 09-27-1945     Patient Gender: F Patient Age:   21 years Exam Location:  [facility] Procedure:      VAS US  CAROTID Referring Phys: DAVID ORTIZ --------------------------------------------------------------------------------  Indications:       Syncope. Risk Factors:      Hypertension, hyperlipidemia. Comparison Study:  None. Performing Technologist: Estanislao Heimlich  Examination Guidelines: A complete evaluation includes  B-mode imaging, spectral Doppler, color Doppler, and power Doppler as needed of all accessible portions of each vessel. Bilateral testing is considered an integral part of a complete examination. Limited examinations for reoccurring indications may be performed as noted.  Right Carotid Findings: +----------+--------+--------+--------+------------------+------------------+           PSV cm/sEDV cm/sStenosisPlaque DescriptionComments           +----------+--------+--------+--------+------------------+------------------+ CCA Prox  71      15                                intimal thickening +----------+--------+--------+--------+------------------+------------------+ CCA Distal59      17                                                   +----------+--------+--------+--------+------------------+------------------+  ICA Prox  58      18                                                   +----------+--------+--------+--------+------------------+------------------+ ICA Distal88      26                                                   +----------+--------+--------+--------+------------------+------------------+ ECA       78      12                                                   +----------+--------+--------+--------+------------------+------------------+ +----------+--------+-------+--------+-------------------+           PSV cm/sEDV cmsDescribeArm Pressure (mmHG) +----------+--------+-------+--------+-------------------+ [id number]                                        +----------+--------+-------+--------+-------------------+ +---------+--------+--+--------+--+ VertebralPSV cm/s44EDV cm/s11 +---------+--------+--+--------+--+  Left Carotid Findings: +----------+--------+--------+--------+------------------+------------------+           PSV cm/sEDV cm/sStenosisPlaque DescriptionComments            +----------+--------+--------+--------+------------------+------------------+ CCA Prox  84      25                                intimal thickening +----------+--------+--------+--------+------------------+------------------+ CCA Distal87      20                                intimal thickening +----------+--------+--------+--------+------------------+------------------+ ICA Prox  65      15                                                   +----------+--------+--------+--------+------------------+------------------+ ICA Distal109     39                                                   +----------+--------+--------+--------+------------------+------------------+ ECA       74      12                                                   +----------+--------+--------+--------+------------------+------------------+ +----------+--------+--------+--------+-------------------+           PSV cm/sEDV cm/sDescribeArm Pressure (mmHG) +----------+--------+--------+--------+-------------------+ [id number]                                         +----------+--------+--------+--------+-------------------+ +---------+--------+---+--------+--+  VertebralPSV cm/s106EDV cm/s21 +---------+--------+---+--------+--+   Summary: Right Carotid: There is no evidence of stenosis in the right ICA. The                extracranial vessels were near-normal with only minimal wall                thickening or plaque. Left Carotid: There is no evidence of stenosis in the left ICA. The extracranial               vessels were near-normal with only minimal wall thickening or               plaque. Vertebrals:  Bilateral vertebral arteries demonstrate antegrade flow. Subclavians: Normal flow hemodynamics were seen in bilateral subclavian              arteries. *See table(s) above for measurements and observations.  Electronically signed by Ardella Beaver MD on 09/20/2023 at 11:55:29 AM.    Final    DG  Chest 2 View Result Date: 09/20/2023 CLINICAL DATA:  77 year old female status post fall with numerous right rib fractures, right pneumothorax, trace hemothorax, possible lung contusion on presentation 2 days ago. EXAM: CHEST - 2 VIEW COMPARISON:  Portable chest yesterday and earlier. FINDINGS: AP and lateral views 0651 hours. Regressed but not resolved right body wall, supraclavicular subcutaneous gas. Mildly improved lung volumes and bilateral ventilation. Small residual right pneumothorax is visible underlying the lateral 2nd rib. No layering pleural fluid now. Curvilinear opacity in the right lower lobe. Mediastinal contours are stable and within normal limits. Stable left lung ventilation. Stable visualized osseous structures. Negative visible bowel gas. IMPRESSION: 1. Regressed but not resolved right side pneumothorax, chest wall subcutaneous gas. 2. Mildly improved lung volumes and ventilation since yesterday. Curvilinear atelectasis in the right lower lobe. Electronically Signed   By: Marlise Simpers M.D.   On: 09/20/2023 09:00   Scheduled Meds:  atorvastatin   20 mg Oral Daily   budeson-glycopyrrolate -formoterol   2 puff Inhalation BID   budesonide   6 mg Oral Daily   busPIRone   5 mg Oral BID   calcium -vitamin D  1 tablet Oral Q breakfast   vitamin B-12  1,000 mcg Oral Daily   docusate sodium   100 mg Oral BID   enoxaparin  (LOVENOX ) injection  30 mg Subcutaneous Q12H   lidocaine   2 patch Transdermal Q24H   montelukast   10 mg Oral Daily   naproxen   250 mg Oral TID WC   polycarbophil  625 mg Oral BID   sertraline   50 mg Oral Daily   Continuous Infusions:     LOS: 3 days   Time spent:  Haydee Lipa, DO [facility]  If 7PM-7AM, please contact night-coverage [email or web address]  09/21/2023, 7:13 AM

## 2023-09-21 NOTE — Progress Notes (Signed)
 Assessment & Plan: HD#2 - fall, right rib fractures 7-10, PTX CXR this AM with persistent apical PTX, not worse Increased mobility, working with IS Pain control good   Patient appears stable at this time with improvement in pain.  Crepitance posterior chest wall has diminished.  Will plan to repeat CXR in AM 4/21.  If continued improvement, home Monday 4/21.  Hold off on thoracostomy tube placement.        [name], MD [facility] practice Office: [phone]        Chief Complaint: Fall, rib fx's, PTX  Subjective: Patient in bed, comfortable.  Oxygen placed in nose.  Objective: Vital signs in last 24 hours: Temp:  [97.4 F (36.3 C)-98.2 F (36.8 C)] 98 F (36.7 C) (04/20 0720) Pulse Rate:  [65-71] 65 (04/20 0720) Resp:  [12-25] 19 (04/20 0745) BP: (119-132)/(63-84) 124/82 (04/20 0720) SpO2:  [92 %-97 %] 96 % (04/20 0913) Last BM Date : 09/21/23  Intake/Output from previous day: 04/19 0701 - 04/20 0700 In: 560 [P.O.:560] Out: -  Intake/Output this shift: No intake/output data recorded.  Physical Exam: HEENT - sclerae clear, mucous membranes moist Neck - soft, non-tender Chest - stable ecchymosis on right; posterior crepitance improved  Lab Results:  Recent Labs    09/19/23 0403  WBC 10.3  HGB 12.1  HCT 36.4  PLT 262   BMET Recent Labs    09/19/23 0403 09/21/23 0754  NA 132* 136  K 4.0 4.5  CL 98 100  CO2 22 27  GLUCOSE 154* 108*  BUN 13 9  CREATININE 1.24* 0.76  CALCIUM  9.3 10.2   PT/INR No results for input(s): "LABPROT", "INR" in the last 72 hours. Comprehensive Metabolic Panel:    Component Value Date/Time   NA 136 09/21/2023 0754   NA 132 (L) 09/19/2023 0403   NA 135 (A) 12/27/2021 0000   K 4.5 09/21/2023 0754   K 4.0 09/19/2023 0403   CL 100 09/21/2023 0754   CL 98 09/19/2023 0403   CO2 27 09/21/2023 0754   CO2 22 09/19/2023 0403   BUN 9 09/21/2023 0754   BUN 13 09/19/2023 0403   BUN 15  12/27/2021 0000   CREATININE 0.76 09/21/2023 0754   CREATININE 1.24 (H) 09/19/2023 0403   GLUCOSE 108 (H) 09/21/2023 0754   GLUCOSE 154 (H) 09/19/2023 0403   CALCIUM  10.2 09/21/2023 0754   CALCIUM  9.3 09/19/2023 0403   AST 36 09/18/2023 0252   AST 24 08/22/2022 1201   ALT 30 09/18/2023 0252   ALT 21 08/22/2022 1201   ALKPHOS 82 09/18/2023 0252   ALKPHOS 101 08/22/2022 1201   BILITOT 0.7 09/18/2023 0252   BILITOT 0.7 08/22/2022 1201   BILITOT 0.6 05/24/2022 1557   PROT 7.1 09/18/2023 0252   PROT 6.9 08/22/2022 1201   PROT 7.4 05/24/2022 1557   ALBUMIN 3.7 09/18/2023 0252   ALBUMIN 4.2 08/22/2022 1201   ALBUMIN 3.5 05/24/2022 1557    Studies/Results: DG Chest 2 View Result Date: 09/21/2023 CLINICAL DATA:  Evaluate/that follow-up right pneumothorax. EXAM: CHEST - 2 VIEW COMPARISON:  09/20/2023 FINDINGS: Heart size and mediastinal contours are stable. The tiny scratch set small right apical pneumothorax measuring approximately 8 mm over the apex appears similar to the previous exam. No signs of pleural effusion or edema. Similar appearance of right mid and left base subsegmental atelectasis. Soft tissue gas is again noted within the right supraclavicular region and extending along the right chest wall. Similar  to previous study. No new findings. IMPRESSION: 1. Stable appearance of small right apical pneumothorax. 2. Stable appearance of right mid and left base subsegmental atelectasis. 3. Stable soft tissue gas within the right supraclavicular region and extending along the right chest wall. Electronically Signed   By: Kimberley Penman M.D.   On: 09/21/2023 08:11   VAS US  CAROTID Result Date: 09/20/2023 Carotid Arterial Duplex Study Patient Johnston:  Ana Johnston  Date of Exam:   09/19/2023 Medical Rec #: [id number]     Accession #:    [id number] Date of Birth: 1945-09-23     Patient Gender: F Patient Age:   78 years Exam Location:  [facility] Procedure:      VAS US  CAROTID Referring Phys:  DAVID ORTIZ --------------------------------------------------------------------------------  Indications:       Syncope. Risk Factors:      Hypertension, hyperlipidemia. Comparison Study:  None. Performing Technologist: Estanislao Heimlich  Examination Guidelines: A complete evaluation includes B-mode imaging, spectral Doppler, color Doppler, and power Doppler as needed of all accessible portions of each vessel. Bilateral testing is considered an integral part of a complete examination. Limited examinations for reoccurring indications may be performed as noted.  Right Carotid Findings: +----------+--------+--------+--------+------------------+------------------+           PSV cm/sEDV cm/sStenosisPlaque DescriptionComments           +----------+--------+--------+--------+------------------+------------------+ CCA Prox  71      15                                intimal thickening +----------+--------+--------+--------+------------------+------------------+ CCA Distal59      17                                                   +----------+--------+--------+--------+------------------+------------------+ ICA Prox  58      18                                                   +----------+--------+--------+--------+------------------+------------------+ ICA Distal88      26                                                   +----------+--------+--------+--------+------------------+------------------+ ECA       78      12                                                   +----------+--------+--------+--------+------------------+------------------+ +----------+--------+-------+--------+-------------------+           PSV cm/sEDV cmsDescribeArm Pressure (mmHG) +----------+--------+-------+--------+-------------------+ [id number]                                        +----------+--------+-------+--------+-------------------+ +---------+--------+--+--------+--+ VertebralPSV  cm/s44EDV cm/s11 +---------+--------+--+--------+--+  Left Carotid Findings: +----------+--------+--------+--------+------------------+------------------+  PSV cm/sEDV cm/sStenosisPlaque DescriptionComments           +----------+--------+--------+--------+------------------+------------------+ CCA Prox  84      25                                intimal thickening +----------+--------+--------+--------+------------------+------------------+ CCA Distal87      20                                intimal thickening +----------+--------+--------+--------+------------------+------------------+ ICA Prox  65      15                                                   +----------+--------+--------+--------+------------------+------------------+ ICA Distal109     39                                                   +----------+--------+--------+--------+------------------+------------------+ ECA       74      12                                                   +----------+--------+--------+--------+------------------+------------------+ +----------+--------+--------+--------+-------------------+           PSV cm/sEDV cm/sDescribeArm Pressure (mmHG) +----------+--------+--------+--------+-------------------+ [id number]                                         +----------+--------+--------+--------+-------------------+ +---------+--------+---+--------+--+ VertebralPSV cm/s106EDV cm/s21 +---------+--------+---+--------+--+   Summary: Right Carotid: There is no evidence of stenosis in the right ICA. The                extracranial vessels were near-normal with only minimal wall                thickening or plaque. Left Carotid: There is no evidence of stenosis in the left ICA. The extracranial               vessels were near-normal with only minimal wall thickening or               plaque. Vertebrals:  Bilateral vertebral arteries demonstrate antegrade flow.  Subclavians: Normal flow hemodynamics were seen in bilateral subclavian              arteries. *See table(s) above for measurements and observations.  Electronically signed by Ardella Beaver MD on 09/20/2023 at 11:55:29 AM.    Final    DG Chest 2 View Result Date: 09/20/2023 CLINICAL DATA:  78 year old female status post fall with numerous right rib fractures, right pneumothorax, trace hemothorax, possible lung contusion on presentation 2 days ago. EXAM: CHEST - 2 VIEW COMPARISON:  Portable chest yesterday and earlier. FINDINGS: AP and lateral views 0651 hours. Regressed but not resolved right body wall, supraclavicular subcutaneous gas. Mildly improved lung volumes and bilateral ventilation. Small residual right pneumothorax is visible underlying the lateral 2nd rib. No layering pleural fluid now.  Curvilinear opacity in the right lower lobe. Mediastinal contours are stable and within normal limits. Stable left lung ventilation. Stable visualized osseous structures. Negative visible bowel gas. IMPRESSION: 1. Regressed but not resolved right side pneumothorax, chest wall subcutaneous gas. 2. Mildly improved lung volumes and ventilation since yesterday. Curvilinear atelectasis in the right lower lobe. Electronically Signed   By: Marlise Simpers M.D.   On: 09/20/2023 09:00      [name] 09/21/2023  Patient ID: Ana Johnston, female   DOB: [birth year], 78 y.o.   MRN: [id number]

## 2023-09-22 ENCOUNTER — Inpatient Hospital Stay (HOSPITAL_COMMUNITY)

## 2023-09-22 ENCOUNTER — Other Ambulatory Visit (HOSPITAL_COMMUNITY): Payer: Self-pay

## 2023-09-22 ENCOUNTER — Encounter (HOSPITAL_COMMUNITY): Payer: Self-pay

## 2023-09-22 DIAGNOSIS — S2241XA Multiple fractures of ribs, right side, initial encounter for closed fracture: Secondary | ICD-10-CM | POA: Diagnosis not present

## 2023-09-22 DIAGNOSIS — S270XXA Traumatic pneumothorax, initial encounter: Secondary | ICD-10-CM | POA: Diagnosis not present

## 2023-09-22 MED ORDER — HYDROMORPHONE HCL 1 MG/ML IJ SOLN: 0.5000 mg | INTRAMUSCULAR | Status: DC | PRN

## 2023-09-22 MED ORDER — ATORVASTATIN CALCIUM 20 MG PO TABS: 20.0000 mg | ORAL_TABLET | Freq: Every day | ORAL | Status: DC

## 2023-09-22 MED ORDER — OXYCODONE HCL 5 MG PO TABS
5.0000 mg | ORAL_TABLET | Freq: Four times a day (QID) | ORAL | 0 refills | Status: DC | PRN
  Filled 2023-09-22: qty 15, 4d supply, fill #0

## 2023-09-22 MED ORDER — ACETAMINOPHEN 500 MG PO TABS: 1000.0000 mg | ORAL_TABLET | Freq: Four times a day (QID) | ORAL | Status: DC

## 2023-09-22 MED ORDER — ACETAMINOPHEN 500 MG PO TABS: 1000.0000 mg | ORAL_TABLET | Freq: Three times a day (TID) | ORAL | Status: AC | PRN

## 2023-09-22 NOTE — Progress Notes (Signed)
 Mobility Specialist Progress Note:    09/22/23 1405  Therapy Vitals  Temp 98.4 F (36.9 C)  Temp Source Oral  Pulse Rate 70  BP 119/75  Oxygen Therapy  SpO2 94 %  Mobility  Activity Ambulated with assistance in hallway  Level of Assistance Standby assist, set-up cues, supervision of patient - no hands on  Assistive Device Front wheel walker  Distance Ambulated (ft) 180 ft  Activity Response Tolerated well  Mobility Referral Yes  Mobility visit 1 Mobility  Mobility Specialist Start Time (ACUTE ONLY) 1335  Mobility Specialist Stop Time (ACUTE ONLY) 1342  Mobility Specialist Time Calculation (min) (ACUTE ONLY) 7 min   Pt received in bed and agreeable. Required no physical assistance throughout. Asymptomatic w/ no complaints. Pt left in bed with call bell and all needs met.  D'Vante Nolon Baxter Mobility Specialist Please contact via Special educational needs teacher or Rehab office at [phone]

## 2023-09-22 NOTE — Progress Notes (Signed)
 Pt's nurse reaching out to MD/PA (medical and surgery) to speak with pt's daughter. Pt will need daughter present for d/c instructions. Nurse waiting to hear back from MD/PA.   Fleta Human, RN SWOT

## 2023-09-22 NOTE — Discharge Summary (Signed)
 Patient ID: Ana Johnston [id number] 07-04-1945 78 y.o.  Admit date: 09/18/2023 Discharge date: 09/22/2023  Admitting Diagnosis: ***  Discharge Diagnosis Patient Active Problem List   Diagnosis Date Noted   Fall at home 09/18/2023   Pneumothorax, right 09/18/2023   Former heavy cigarette smoker (20-39 per day) - quit 2021 09/18/2023   History of pulmonary embolus (PE) 09/18/2023   Personal history of multiple falls 09/18/2023   History of Clostridioides difficile colitis 09/18/2023   Subcutaneous emphysema due to trauma (HCC) 09/18/2023   Episodic lightheadedness 09/18/2023   Traumatic fracture of ribs with pneumothorax 09/18/2023   Hypokalemia 09/18/2023   Cervical spinal stenosis 05/16/2023   Traumatic fracture of ribs 7-10 of right side with pneumothorax 10/29/2022   Hyperlipidemia 08/21/2022   Coronary artery disease 08/21/2022   Generalized anxiety disorder 04/17/2022   Essential hypertension 04/09/2022   COPD (chronic obstructive pulmonary disease) (HCC) 04/09/2022   Family history of heart disease 04/09/2022   Nuclear sclerotic cataract of left eye 07/29/2014   Osteopenia of multiple sites (-1.01-2020) 06/10/2012   Primary osteoarthritis involving multiple joints 10/02/2011    Consultants ***  HPI: ***  Procedures ***  Hospital Course:  ***    Physical Exam: Gen:  Alert, NAD, pleasant Card:  Reg Pulm:  CTAB, no W/R/R, effort normal Abd: Soft, *** distension, appropriately tender around laparoscopic incisions, no rigidity or guarding and otherwise NT, +BS. Incisions with glue intact appears well and are without drainage, bleeding, or signs of infection  Ext:  No LE edema Psych: A&Ox3 ***   Allergies as of 09/22/2023   No Known Allergies      Medication List     PAUSE taking these medications    meloxicam  15 MG tablet Wait to take this until your doctor or other care provider tells you to start again. Commonly known as: MOBIC  Take 1 tablet  (15 mg total) by mouth daily.       TAKE these medications    acetaminophen  500 MG tablet Commonly known as: TYLENOL  Take 2 tablets (1,000 mg total) by mouth every 8 (eight) hours as needed.   albuterol  108 (90 Base) MCG/ACT inhaler Commonly known as: VENTOLIN  HFA Inhale 2 puffs into the lungs every 6 (six) hours as needed for wheezing or shortness of breath.   amLODipine  5 MG tablet Commonly known as: NORVASC  Take 1 tablet (5 mg total) by mouth daily.   atorvastatin  20 MG tablet Commonly known as: LIPITOR Take 1 tablet (20 mg total) by mouth daily.   B COMPLEX-B12 PO Take 1 tablet by mouth daily at 12 noon.   budesonide  3 MG 24 hr capsule Commonly known as: ENTOCORT EC  Take 6 mg by mouth at bedtime.   busPIRone  5 MG tablet Commonly known as: BUSPAR  Take 1 tablet (5 mg total) by mouth 2 (two) times daily.   calcium -vitamin D 500-200 MG-UNIT tablet Commonly known as: OSCAL WITH D Take 1 tablet by mouth daily with breakfast.   colestipol 1 g tablet Commonly known as: COLESTID Take 1 g by mouth at bedtime.   FISH OIL PO Take 1 tablet by mouth daily.   hyoscyamine 0.375 MG 12 hr tablet Commonly known as: LEVBID Take 0.75 mg by mouth 2 (two) times daily.   loratadine 10 MG tablet Commonly known as: CLARITIN Take 10 mg by mouth daily as needed for allergies or rhinitis.   magnesium  30 MG tablet Take 30 mg by mouth daily.   Melatonin 10 MG Tabs Take  20 mg by mouth at bedtime as needed (sleep).   montelukast  10 MG tablet Commonly known as: SINGULAIR  Take 1 tablet (10 mg total) by mouth daily. What changed: when to take this   oxyCODONE  5 MG immediate release tablet Commonly known as: Oxy IR/ROXICODONE  Take 1 tablet (5 mg total) by mouth every 6 (six) hours as needed for breakthrough pain.   potassium chloride  SA 20 MEQ tablet Commonly known as: KLOR-CON  M Take 20 mEq by mouth daily.   sertraline  50 MG tablet Commonly known as: ZOLOFT  Take 1 tablet (50  mg total) by mouth daily. What changed: when to take this   tiZANidine  4 MG tablet Commonly known as: Zanaflex  Take 0.5-1 tablets (2-4 mg total) by mouth every 8 (eight) hours as needed for muscle spasms.   Trelegy Ellipta  200-62.5-25 MCG/ACT Aepb Generic drug: Fluticasone -Umeclidin-Vilant Inhale 1 Inhalation into the lungs daily.   VITAMIN B-12 PO Take 1 capsule by mouth daily.   Vitamin D3 25 MCG (1000 UT) Chew Chew 1 tablet by mouth daily.   Xyzal  Allergy  24HR 5 MG tablet Generic drug: levocetirizine Take 1 tablet (5 mg total) by mouth every evening.               Durable Medical Equipment  (From admission, onward)           Start     Ordered   09/18/23 1342  For home use only DME Walker rolling  Once       Question Answer Comment  Walker: With 5 Inch Wheels   Patient needs a walker to treat with the following condition Difficulty in walking, not elsewhere classified      09/18/23 1342              Follow-up Information     Kuneff, Renee A, DO Follow up.   Specialty: Family Medicine Why: For posthospitalization follow-up Contact information: [address] [place] [address] [phone]                 Signed: Alferd Igo, [facility] Surgery 09/22/2023, 4:17 PM Please see Amion for pager number during day hours 7:00am-4:30pm

## 2023-09-22 NOTE — Progress Notes (Signed)
 Subjective: CC: R rib pain stable and well controlled with po medications. No sob currently. On RA. Tolerating diet without abdominal pain, n/v. Last BM yesterday. Voiding without issues. Mobilized w/ therapies today - 200 ft mod ind + stairs.   Reports she lives alone but her son and DIL can help at d/c.   Afebrile. No tachycardia or hypotension. No recent labs. CXR ordered.   Objective: Vital signs in last 24 hours: Temp:  [97.7 F (36.5 C)-98.8 F (37.1 C)] 98 F (36.7 C) (04/21 0749) Pulse Rate:  [68-73] 73 (04/21 0920) Resp:  [16-19] 16 (04/21 0519) BP: (98-145)/(75-82) 145/75 (04/21 0749) SpO2:  [93 %-100 %] 97 % (04/21 0920) Last BM Date : 09/21/23  Intake/Output from previous day: 04/20 0701 - 04/21 0700 In: 840 [P.O.:840] Out: -  Intake/Output this shift: Total I/O In: 120 [P.O.:120] Out: -   PE: Gen:  Alert, NAD, pleasant HEENT: EOM's intact, pupils equal and round Card:  Reg Pulm:  CTAB, no W/R/R, effort normal. +BS. No significant crepitus of chest wall. Bruising noted of R flank.  Abd: Soft, ND, NT, +BS Ext:  No LE edema or calf tenderness Neuro: CN 3-12 grossly intact, f/c, mae's, non-focal Psych: A&Ox3  Skin: no rashes noted, warm and dry  Lab Results:  No results for input(s): "WBC", "HGB", "HCT", "PLT" in the last 72 hours. BMET Recent Labs    09/21/23 0754  NA 136  K 4.5  CL 100  CO2 27  GLUCOSE 108*  BUN 9  CREATININE 0.76  CALCIUM  10.2   PT/INR No results for input(s): "LABPROT", "INR" in the last 72 hours. CMP     Component Value Date/Time   NA 136 09/21/2023 0754   NA 135 (A) 12/27/2021 0000   K 4.5 09/21/2023 0754   CL 100 09/21/2023 0754   CO2 27 09/21/2023 0754   GLUCOSE 108 (H) 09/21/2023 0754   BUN 9 09/21/2023 0754   BUN 15 12/27/2021 0000   CREATININE 0.76 09/21/2023 0754   CALCIUM  10.2 09/21/2023 0754   PROT 7.1 09/18/2023 0252   PROT 6.9 08/22/2022 1201   ALBUMIN 3.7 09/18/2023 0252   ALBUMIN 4.2  08/22/2022 1201   AST 36 09/18/2023 0252   ALT 30 09/18/2023 0252   ALKPHOS 82 09/18/2023 0252   BILITOT 0.7 09/18/2023 0252   BILITOT 0.7 08/22/2022 1201   GFRNONAA >60 09/21/2023 0754   GFRAA >60 11/01/2019 0046   Lipase     Component Value Date/Time   LIPASE 57 (H) 05/25/2022 0133    Studies/Results: DG Chest 2 View Result Date: 09/21/2023 CLINICAL DATA:  Evaluate/that follow-up right pneumothorax. EXAM: CHEST - 2 VIEW COMPARISON:  09/20/2023 FINDINGS: Heart size and mediastinal contours are stable. The tiny scratch set small right apical pneumothorax measuring approximately 8 mm over the apex appears similar to the previous exam. No signs of pleural effusion or edema. Similar appearance of right mid and left base subsegmental atelectasis. Soft tissue gas is again noted within the right supraclavicular region and extending along the right chest wall. Similar to previous study. No new findings. IMPRESSION: 1. Stable appearance of small right apical pneumothorax. 2. Stable appearance of right mid and left base subsegmental atelectasis. 3. Stable soft tissue gas within the right supraclavicular region and extending along the right chest wall. Electronically Signed   By: Kimberley Penman M.D.   On: 09/21/2023 08:11    Anti-infectives: Anti-infectives (From admission, onward)    None  Assessment/Plan Fall  Right rib fractures 7-10 - multimodal pain control R PTX - CXR yesterday w/ stable small R apical PTX. Plan was for repeat CXR today and if continued improvement, home today. Will write for repeat CXR  - Appreciate TRH assistance with the following -  COPD - home nebs, on RA AKI - resolved HTN - prn meds GAD HLD - home meds CAD  Hx PE - reports not on blood thinners prior to arrival.   FEN - Reg VTE - SCDs, Lovenox  ID - None Foley - None, spont void  Plan - CXR. If stable, plan d/c home today. Will check with TRH to ensure they are okay with this and discuss recs at  d/c.   I reviewed nursing notes, hospitalist notes, last 24 h vitals and pain scores, last 48 h intake and output, last 24 h labs and trends, and last 24 h imaging results.   LOS: 4 days    Delton Filbert, [facility] Surgery 09/22/2023, 12:26 PM Please see Amion for pager number during day hours 7:00am-4:30pm

## 2023-09-22 NOTE — Progress Notes (Signed)
 PROGRESS NOTE    Ana Johnston  BJY:[id number] DOB: 09/15/1945 DOA: 09/18/2023 PCP: Mariel Shope, DO   Brief Narrative:  Ana Johnston is a 78 y.o. female with past medical history of pulmonary embolism, arthritis, atypical chest pain, CAD, COPD, hyperlipidemia, cervical spinal stenosis, C. difficile colitis, GAD, depression, diverticulosis, diverticulitis, dyspnea on exertion, hypertension, seasonal allergies who was admitted by general surgery trauma team due to having a fall at home resulting in multiple right rib fractures complicated by subcutaneous emphysema and hemo/pneumothorax.   Assessment & Plan:   Principal Problem:   Traumatic fracture of ribs 7-10 of right side with pneumothorax Active Problems:   Essential hypertension   COPD (chronic obstructive pulmonary disease) (HCC)   Generalized anxiety disorder   Hyperlipidemia   Coronary artery disease   Osteopenia of multiple sites (-1.01-2020)   Fall at home   Pneumothorax, right   Former heavy cigarette smoker (20-39 per day) - quit 2021   History of pulmonary embolus (PE)   Personal history of multiple falls   Subcutaneous emphysema due to trauma [facility])   Episodic lightheadedness   Traumatic fracture of ribs with pneumothorax   Hypokalemia   Acute hypoxic respiratory failure, resolved COPD (chronic obstructive pulmonary disease) (HCC) not in acute exacerbation Secondary to rib fractures/trauma (see below) Currently on room air Continue incentive spirometry and flutter Continue Breztri  2 puffs twice daily/albuterol  nebs. Former heavy cigarette smoker (20-39 per day) - quit 2021  AKI, resolved - Likely secondary to poor p.o. intake in the setting of above, continue increased free water intake  Essential hypertension Well-controlled off medications  Generalized anxiety disorder Continue buspirone , sertraline   Hyperlipidemia Continue atorvastatin   Coronary artery disease Carotid doppler unremarkable for  stenosis bilaterally Continue statin Beta-blocker on hold   Osteopenia of multiple sites (-1.01-2020) Follow-up with PCP.   History of pulmonary embolus (PE) Continue DVT prophylaxis with enoxaparin .   Episodic lightheadedness Will check echocardiogram and carotid Doppler.   Hypokalemia, resolved Resume home potassium  Ambulatory dysfunction with multiple falls at home Traumatic fracture of ribs 7-10 of right side Pneumothorax, right Subcutaneous emphysema due to trauma [facility]) Continue current treatment measures per trauma team, no indication for chest tube Repeat chest x-ray in the morning PT/OT to eval and follow   DVT prophylaxis: enoxaparin  (LOVENOX ) injection 30 mg Start: 09/19/23 1000 SCDs Start: 09/18/23 0702 Code Status:   Code Status: Full Code Family Communication: None present  Status is: Inpatient  Dispo: The patient is from: Home              Anticipated d/c is to: Home              Anticipated d/c date is: Per primary team              Patient currently not medically stable for discharge  Consultants:  We are, trauma primary  Procedures:  None  Antimicrobials:  None  Subjective: No acute issues or events overnight, pain currently well-controlled, respiratory status improving  -reports she feels back to baseline  Objective: Vitals:   09/21/23 1700 09/21/23 1949 09/22/23 0519 09/22/23 0749  BP: 127/75 98/82 126/75 (!) 145/75  Pulse: 71 71 72 68  Resp: 19 16 16    Temp: 98.8 F (37.1 C) 97.8 F (36.6 C) 97.7 F (36.5 C) 98 F (36.7 C)  TempSrc: Oral     SpO2: 100% 93% 94% 93%  Weight:      Height:        Intake/Output Summary (  Last 24 hours) at 09/22/2023 0751 Last data filed at 09/21/2023 1830 Gross per 24 hour  Intake 840 ml  Output --  Net 840 ml   Filed Weights   09/18/23 0156  Weight: 66.7 kg    Examination:  General:  Pleasantly resting in bed, No acute distress. HEENT:  Normocephalic atraumatic.  Sclerae nonicteric,  noninjected.  Extraocular movements intact bilaterally. Neck:  Without mass or deformity.  Trachea is midline. Lungs: Diminished without overt rhonchi, wheeze, or rales. Heart:  Regular rate and rhythm.  Without murmurs, rubs, or gallops. Abdomen:  Soft, nontender, nondistended.  Without guarding or rebound. Extremities: Without cyanosis, clubbing, edema, or obvious deformity. Skin:  Warm and dry, no erythema.  Data Reviewed: I have personally reviewed following labs and imaging studies  CBC: Recent Labs  Lab 09/18/23 0252 09/18/23 0302 09/19/23 0403  WBC 14.9*  --  10.3  NEUTROABS 12.1*  --   --   HGB 14.8 15.6* 12.1  HCT 45.4 46.0 36.4  MCV 96.4  --  95.5  PLT 317  --  262   Basic Metabolic Panel: Recent Labs  Lab 09/18/23 0252 09/18/23 0302 09/19/23 0403 09/21/23 0754  NA 135 135 132* 136  K 3.3* 3.2* 4.0 4.5  CL 98 99 98 100  CO2 23  --  22 27  GLUCOSE 100* 106* 154* 108*  BUN 14 13 13 9   CREATININE 0.50 0.90 1.24* 0.76  CALCIUM  9.3  --  9.3 10.2  MG 1.8  --   --   --   PHOS 2.7  --   --   --    GFR: Estimated Creatinine Clearance: 49.4 mL/min (by C-G formula based on SCr of 0.76 mg/dL).  Liver Function Tests: Recent Labs  Lab 09/18/23 0252  AST 36  ALT 30  ALKPHOS 82  BILITOT 0.7  PROT 7.1  ALBUMIN 3.7   No results found for this or any previous visit (from the past 240 hours).   Radiology Studies: DG Chest 2 View Result Date: 09/21/2023 CLINICAL DATA:  Evaluate/that follow-up right pneumothorax. EXAM: CHEST - 2 VIEW COMPARISON:  09/20/2023 FINDINGS: Heart size and mediastinal contours are stable. The tiny scratch set small right apical pneumothorax measuring approximately 8 mm over the apex appears similar to the previous exam. No signs of pleural effusion or edema. Similar appearance of right mid and left base subsegmental atelectasis. Soft tissue gas is again noted within the right supraclavicular region and extending along the right chest wall.  Similar to previous study. No new findings. IMPRESSION: 1. Stable appearance of small right apical pneumothorax. 2. Stable appearance of right mid and left base subsegmental atelectasis. 3. Stable soft tissue gas within the right supraclavicular region and extending along the right chest wall. Electronically Signed   By: Kimberley Penman M.D.   On: 09/21/2023 08:11   Scheduled Meds:  atorvastatin   20 mg Oral Daily   budeson-glycopyrrolate -formoterol   2 puff Inhalation BID   budesonide   6 mg Oral Daily   busPIRone   5 mg Oral BID   calcium -vitamin D  1 tablet Oral Q breakfast   vitamin B-12  1,000 mcg Oral Daily   docusate sodium   100 mg Oral BID   enoxaparin  (LOVENOX ) injection  30 mg Subcutaneous Q12H   lidocaine   2 patch Transdermal Q24H   montelukast   10 mg Oral Daily   naproxen   250 mg Oral TID WC   polycarbophil  625 mg Oral BID   potassium chloride  SA  20 mEq Oral Daily   sertraline   50 mg Oral Daily   Continuous Infusions:     LOS: 4 days   Time spent:  Haydee Lipa, DO [facility]  If 7PM-7AM, please contact night-coverage [email or web address]  09/22/2023, 7:51 AM

## 2023-09-22 NOTE — Progress Notes (Signed)
 Physical Therapy Treatment Patient Details Name: Ana Johnston MRN: [id number] DOB: 07/07/1945 Today's Date: 09/22/2023   History of Present Illness 78 y.o. female admitted with right posterior chest wall pain following a mechanical fall at home. Dx of R 7-10 rib fractures, pneumothorax. PMH: HTN, COPD, osteopenia, spinal stenosis    PT Comments  Pt tolerates treatment well, ambulating for increased distances and negotiating steps without physical assistance. Pt demonstrates improved activity tolerance and reports improved confidence in her mobility. PT encourages continued frequent mobilization in an effort to improve pulmonary function further.    If plan is discharge home, recommend the following: A little help with bathing/dressing/bathroom;Assistance with cooking/housework;Assist for transportation;Help with stairs or ramp for entrance   Can travel by private vehicle        Equipment Recommendations  Rolling walker (2 wheels)    Recommendations for Other Services       Precautions / Restrictions Precautions Precautions: Fall Recall of Precautions/Restrictions: Intact Precaution/Restrictions Comments: R 7-10 rib fxs Restrictions Weight Bearing Restrictions Per Provider Order: No     Mobility  Bed Mobility Overal bed mobility: Modified Independent                  Transfers Overall transfer level: Modified independent Equipment used: Rolling walker (2 wheels) Transfers: Sit to/from Stand Sit to Stand: Modified independent (Device/Increase time)                Ambulation/Gait Ambulation/Gait assistance: Modified independent (Device/Increase time) Gait Distance (Feet): 200 Feet Assistive device: Rolling walker (2 wheels) Gait Pattern/deviations: Step-through pattern Gait velocity: functional Gait velocity interpretation: 1.31 - 2.62 ft/sec, indicative of limited community ambulator   General Gait Details: steady step-through gait with support of  RW   Stairs Stairs: Yes Stairs assistance: Supervision Stair Management: One rail Right, Step to pattern, Forwards Number of Stairs: 6     Wheelchair Mobility     Tilt Bed    Modified Rankin (Stroke Patients Only)       Balance Overall balance assessment: Needs assistance Sitting-balance support: No upper extremity supported, Feet supported Sitting balance-Leahy Scale: Good     Standing balance support: Single extremity supported, Reliant on assistive device for balance Standing balance-Leahy Scale: Poor                              Communication Communication Communication: No apparent difficulties  Cognition Arousal: Alert Behavior During Therapy: WFL for tasks assessed/performed   PT - Cognitive impairments: No apparent impairments                         Following commands: Intact      Cueing Cueing Techniques: Verbal cues  Exercises      General Comments General comments (skin integrity, edema, etc.): VSS on RA, pt saturating at 90% when ambulating on room air, sats at 94% on room air at rest after ambulation      Pertinent Vitals/Pain Pain Assessment Pain Assessment: Faces Faces Pain Scale: Hurts even more Pain Location: ribs Pain Descriptors / Indicators: Sore Pain Intervention(s): Monitored during session    Home Living                          Prior Function            PT Goals (current goals can now be found in the care plan section) Acute Rehab  PT Goals Patient Stated Goal: return home to her dog Progress towards PT goals: Progressing toward goals    Frequency    Min 3X/week      PT Plan      Co-evaluation              AM-PAC PT "6 Clicks" Mobility   Outcome Measure  Help needed turning from your back to your side while in a flat bed without using bedrails?: None Help needed moving from lying on your back to sitting on the side of a flat bed without using bedrails?: None Help  needed moving to and from a bed to a chair (including a wheelchair)?: None Help needed standing up from a chair using your arms (e.g., wheelchair or bedside chair)?: None Help needed to walk in hospital room?: None Help needed climbing 3-5 steps with a railing? : A Little 6 Click Score: 23    End of Session   Activity Tolerance: Patient tolerated treatment well Patient left: in chair;with call bell/phone within reach Nurse Communication: Mobility status PT Visit Diagnosis: Pain;History of falling (Z91.81);Difficulty in walking, not elsewhere classified (R26.2)     Time: 0815-0828 PT Time Calculation (min) (ACUTE ONLY): 13 min  Charges:    $Gait Training: 8-22 mins PT General Charges $$ ACUTE PT VISIT: 1 Visit                     Rexie Catena, PT, DPT Acute Rehabilitation Office [phone]    Rexie Catena 09/22/2023, 8:32 AM

## 2023-09-22 NOTE — Care Management Important Message (Signed)
 Important Message  Patient Details  Name: Ana Johnston MRN: [id number] Date of Birth: 04-Apr-1946   Important Message Given:  Yes - Medicare IM     Felix Host 09/22/2023, 4:25 PM

## 2023-09-22 NOTE — Plan of Care (Signed)

## 2023-09-22 NOTE — Progress Notes (Addendum)
 Went over discharge instructions with pt. Pt verbalized understanding and stated that her daughter knows about her prescribed medications. Pt's belongings are packed and given prescribed oxy medication.  1- Pt's daughter arrived to hospital to pick up pt. Pt left with all belongings, paperwork, and medication

## 2023-09-23 ENCOUNTER — Telehealth: Payer: Self-pay

## 2023-09-23 NOTE — Transitions of Care (Post Inpatient/ED Visit) (Signed)
   09/23/2023  Name: Ana Johnston MRN: [id number] DOB: [birth year]  Today's TOC FU Call Status: Today's TOC FU Call Status:: Unsuccessful Call (1st Attempt) Unsuccessful Call (1st Attempt) Date: 09/23/23  Attempted to reach the patient regarding the most recent Inpatient/ED visit.  Follow Up Plan: Additional outreach attempts will be made to reach the patient to complete the Transitions of Care (Post Inpatient/ED visit) call.   Signature Darrall Ellison, LPN [facility] Nurse Health Advisor Direct Dial [phone]

## 2023-09-24 ENCOUNTER — Telehealth: Payer: Self-pay

## 2023-09-24 DIAGNOSIS — S272XXD Traumatic hemopneumothorax, subsequent encounter: Secondary | ICD-10-CM | POA: Diagnosis not present

## 2023-09-24 DIAGNOSIS — F411 Generalized anxiety disorder: Secondary | ICD-10-CM | POA: Diagnosis not present

## 2023-09-24 DIAGNOSIS — M5030 Other cervical disc degeneration, unspecified cervical region: Secondary | ICD-10-CM | POA: Diagnosis not present

## 2023-09-24 DIAGNOSIS — I700 Atherosclerosis of aorta: Secondary | ICD-10-CM | POA: Diagnosis not present

## 2023-09-24 DIAGNOSIS — M150 Primary generalized (osteo)arthritis: Secondary | ICD-10-CM | POA: Diagnosis not present

## 2023-09-24 DIAGNOSIS — E785 Hyperlipidemia, unspecified: Secondary | ICD-10-CM | POA: Diagnosis not present

## 2023-09-24 DIAGNOSIS — M8589 Other specified disorders of bone density and structure, multiple sites: Secondary | ICD-10-CM | POA: Diagnosis not present

## 2023-09-24 DIAGNOSIS — G8929 Other chronic pain: Secondary | ICD-10-CM | POA: Diagnosis not present

## 2023-09-24 DIAGNOSIS — S2241XD Multiple fractures of ribs, right side, subsequent encounter for fracture with routine healing: Secondary | ICD-10-CM | POA: Diagnosis not present

## 2023-09-24 DIAGNOSIS — M545 Low back pain, unspecified: Secondary | ICD-10-CM | POA: Diagnosis not present

## 2023-09-24 DIAGNOSIS — M4802 Spinal stenosis, cervical region: Secondary | ICD-10-CM | POA: Diagnosis not present

## 2023-09-24 DIAGNOSIS — T797XXD Traumatic subcutaneous emphysema, subsequent encounter: Secondary | ICD-10-CM | POA: Diagnosis not present

## 2023-09-24 DIAGNOSIS — Z87891 Personal history of nicotine dependence: Secondary | ICD-10-CM | POA: Diagnosis not present

## 2023-09-24 DIAGNOSIS — Z86711 Personal history of pulmonary embolism: Secondary | ICD-10-CM | POA: Diagnosis not present

## 2023-09-24 DIAGNOSIS — Z9181 History of falling: Secondary | ICD-10-CM | POA: Diagnosis not present

## 2023-09-24 DIAGNOSIS — I119 Hypertensive heart disease without heart failure: Secondary | ICD-10-CM | POA: Diagnosis not present

## 2023-09-24 DIAGNOSIS — I2510 Atherosclerotic heart disease of native coronary artery without angina pectoris: Secondary | ICD-10-CM | POA: Diagnosis not present

## 2023-09-24 DIAGNOSIS — F32A Depression, unspecified: Secondary | ICD-10-CM | POA: Diagnosis not present

## 2023-09-24 DIAGNOSIS — Z7951 Long term (current) use of inhaled steroids: Secondary | ICD-10-CM | POA: Diagnosis not present

## 2023-09-24 DIAGNOSIS — J449 Chronic obstructive pulmonary disease, unspecified: Secondary | ICD-10-CM | POA: Diagnosis not present

## 2023-09-24 NOTE — Transitions of Care (Post Inpatient/ED Visit) (Signed)
   09/24/2023  Name: Ana Johnston MRN: [id number] DOB: 1946/02/12  Today's TOC FU Call Status: Today's TOC FU Call Status:: Unsuccessful Call (2nd Attempt) Unsuccessful Call (1st Attempt) Date: 09/23/23 Unsuccessful Call (2nd Attempt) Date: 09/24/23  Attempted to reach the patient regarding the most recent Inpatient/ED visit.  Follow Up Plan: Additional outreach attempts will be made to reach the patient to complete the Transitions of Care (Post Inpatient/ED visit) call.   Signature Darrall Ellison, LPN [facility] Nurse Health Advisor Direct Dial [phone]

## 2023-09-24 NOTE — Transitions of Care (Post Inpatient/ED Visit) (Signed)
   09/24/2023  Name: [place] Silveria MRN: [id number] DOB: 06/21/1945  Today's TOC FU Call Status: Today's TOC FU Call Status:: Unsuccessful Call (3rd Attempt) Unsuccessful Call (1st Attempt) Date: 09/23/23 Unsuccessful Call (2nd Attempt) Date: 09/24/23 Unsuccessful Call (3rd Attempt) Date: 09/24/23  Attempted to reach the patient regarding the most recent Inpatient/ED visit.  Follow Up Plan: No further outreach attempts will be made at this time. We have been unable to contact the patient.  Signature Darrall Ellison, LPN [facility] Nurse Health Advisor Direct Dial [phone]

## 2023-09-24 NOTE — Telephone Encounter (Signed)
 Copied from CRM [id number]. Topic: Clinical - Home Health Verbal Orders >> Sep 24, 2023  3:28 PM Deaijah H wrote: Caller/Agency: Loris Ros PT [facility]  Callback Number: [phone] Service Requested: Physical Therapy Frequency: twice a week for 3wk / 1x a week for 5 wks Any new concerns about the patient? No  Pt is currently scheduled for 4/29 with PCP.

## 2023-09-24 NOTE — Telephone Encounter (Signed)
 Copied from CRM [id number]. Topic: Clinical - Prescription Issue >> Sep 24, 2023  3:32 PM Deaijah H wrote: Reason for CRM: Loris Ros PT [facility] called in stating there's some medications on paperwork she's not taking calcium -vitamin D (OSCAL WITH D) 500-200 MG-UNIT tablet, hyoscyamine (LEVBID) 0.375 MG 12 hr tablet, loratadine (CLARITIN) 10 MG tablet, magnesium  30 MG tablet, potassium chloride  SA (KLOR-CON  M) 20 MEQ tablet, Vitamin D3 stated she doesn't have in home at all. If needed, [phone]

## 2023-09-25 NOTE — Telephone Encounter (Signed)
 Spoke with Mardella LaymanLindsey.

## 2023-09-25 NOTE — Telephone Encounter (Signed)
 Noted. Sent as an FYI.

## 2023-09-25 NOTE — Telephone Encounter (Signed)
 They will need to contact surgical team for orders.  I have not seen pt for this condition, or in the past 90 days, to be able to legally sign the paperwork

## 2023-09-26 DIAGNOSIS — F411 Generalized anxiety disorder: Secondary | ICD-10-CM | POA: Diagnosis not present

## 2023-09-26 DIAGNOSIS — Z9181 History of falling: Secondary | ICD-10-CM | POA: Diagnosis not present

## 2023-09-26 DIAGNOSIS — M545 Low back pain, unspecified: Secondary | ICD-10-CM | POA: Diagnosis not present

## 2023-09-26 DIAGNOSIS — J449 Chronic obstructive pulmonary disease, unspecified: Secondary | ICD-10-CM | POA: Diagnosis not present

## 2023-09-26 DIAGNOSIS — M4802 Spinal stenosis, cervical region: Secondary | ICD-10-CM | POA: Diagnosis not present

## 2023-09-26 DIAGNOSIS — I119 Hypertensive heart disease without heart failure: Secondary | ICD-10-CM | POA: Diagnosis not present

## 2023-09-26 DIAGNOSIS — G8929 Other chronic pain: Secondary | ICD-10-CM | POA: Diagnosis not present

## 2023-09-26 DIAGNOSIS — M8589 Other specified disorders of bone density and structure, multiple sites: Secondary | ICD-10-CM | POA: Diagnosis not present

## 2023-09-26 DIAGNOSIS — T797XXD Traumatic subcutaneous emphysema, subsequent encounter: Secondary | ICD-10-CM | POA: Diagnosis not present

## 2023-09-26 DIAGNOSIS — I700 Atherosclerosis of aorta: Secondary | ICD-10-CM | POA: Diagnosis not present

## 2023-09-26 DIAGNOSIS — F32A Depression, unspecified: Secondary | ICD-10-CM | POA: Diagnosis not present

## 2023-09-26 DIAGNOSIS — Z87891 Personal history of nicotine dependence: Secondary | ICD-10-CM | POA: Diagnosis not present

## 2023-09-26 DIAGNOSIS — M150 Primary generalized (osteo)arthritis: Secondary | ICD-10-CM | POA: Diagnosis not present

## 2023-09-26 DIAGNOSIS — Z7951 Long term (current) use of inhaled steroids: Secondary | ICD-10-CM | POA: Diagnosis not present

## 2023-09-26 DIAGNOSIS — E785 Hyperlipidemia, unspecified: Secondary | ICD-10-CM | POA: Diagnosis not present

## 2023-09-26 DIAGNOSIS — M5030 Other cervical disc degeneration, unspecified cervical region: Secondary | ICD-10-CM | POA: Diagnosis not present

## 2023-09-26 DIAGNOSIS — I2510 Atherosclerotic heart disease of native coronary artery without angina pectoris: Secondary | ICD-10-CM | POA: Diagnosis not present

## 2023-09-26 DIAGNOSIS — Z86711 Personal history of pulmonary embolism: Secondary | ICD-10-CM | POA: Diagnosis not present

## 2023-09-26 DIAGNOSIS — S272XXD Traumatic hemopneumothorax, subsequent encounter: Secondary | ICD-10-CM | POA: Diagnosis not present

## 2023-09-26 DIAGNOSIS — S2241XD Multiple fractures of ribs, right side, subsequent encounter for fracture with routine healing: Secondary | ICD-10-CM | POA: Diagnosis not present

## 2023-09-29 DIAGNOSIS — S272XXD Traumatic hemopneumothorax, subsequent encounter: Secondary | ICD-10-CM | POA: Diagnosis not present

## 2023-09-29 DIAGNOSIS — M8589 Other specified disorders of bone density and structure, multiple sites: Secondary | ICD-10-CM | POA: Diagnosis not present

## 2023-09-29 DIAGNOSIS — G8929 Other chronic pain: Secondary | ICD-10-CM | POA: Diagnosis not present

## 2023-09-29 DIAGNOSIS — I119 Hypertensive heart disease without heart failure: Secondary | ICD-10-CM | POA: Diagnosis not present

## 2023-09-29 DIAGNOSIS — Z86711 Personal history of pulmonary embolism: Secondary | ICD-10-CM | POA: Diagnosis not present

## 2023-09-29 DIAGNOSIS — F411 Generalized anxiety disorder: Secondary | ICD-10-CM | POA: Diagnosis not present

## 2023-09-29 DIAGNOSIS — Z7951 Long term (current) use of inhaled steroids: Secondary | ICD-10-CM | POA: Diagnosis not present

## 2023-09-29 DIAGNOSIS — M5030 Other cervical disc degeneration, unspecified cervical region: Secondary | ICD-10-CM | POA: Diagnosis not present

## 2023-09-29 DIAGNOSIS — M545 Low back pain, unspecified: Secondary | ICD-10-CM | POA: Diagnosis not present

## 2023-09-29 DIAGNOSIS — S2241XD Multiple fractures of ribs, right side, subsequent encounter for fracture with routine healing: Secondary | ICD-10-CM | POA: Diagnosis not present

## 2023-09-29 DIAGNOSIS — Z87891 Personal history of nicotine dependence: Secondary | ICD-10-CM | POA: Diagnosis not present

## 2023-09-29 DIAGNOSIS — J449 Chronic obstructive pulmonary disease, unspecified: Secondary | ICD-10-CM | POA: Diagnosis not present

## 2023-09-29 DIAGNOSIS — M4802 Spinal stenosis, cervical region: Secondary | ICD-10-CM | POA: Diagnosis not present

## 2023-09-29 DIAGNOSIS — F32A Depression, unspecified: Secondary | ICD-10-CM | POA: Diagnosis not present

## 2023-09-29 DIAGNOSIS — Z9181 History of falling: Secondary | ICD-10-CM | POA: Diagnosis not present

## 2023-09-29 DIAGNOSIS — M150 Primary generalized (osteo)arthritis: Secondary | ICD-10-CM | POA: Diagnosis not present

## 2023-09-29 DIAGNOSIS — T797XXD Traumatic subcutaneous emphysema, subsequent encounter: Secondary | ICD-10-CM | POA: Diagnosis not present

## 2023-09-29 DIAGNOSIS — I700 Atherosclerosis of aorta: Secondary | ICD-10-CM | POA: Diagnosis not present

## 2023-09-29 DIAGNOSIS — E785 Hyperlipidemia, unspecified: Secondary | ICD-10-CM | POA: Diagnosis not present

## 2023-09-29 DIAGNOSIS — I2510 Atherosclerotic heart disease of native coronary artery without angina pectoris: Secondary | ICD-10-CM | POA: Diagnosis not present

## 2023-09-30 ENCOUNTER — Telehealth: Payer: Self-pay | Admitting: Family Medicine

## 2023-09-30 ENCOUNTER — Encounter: Payer: Self-pay | Admitting: Family Medicine

## 2023-09-30 ENCOUNTER — Ambulatory Visit: Admitting: Family Medicine

## 2023-09-30 ENCOUNTER — Ambulatory Visit (HOSPITAL_BASED_OUTPATIENT_CLINIC_OR_DEPARTMENT_OTHER)
Admission: RE | Admit: 2023-09-30 | Discharge: 2023-09-30 | Disposition: A | Discharge status: Home / Self Care | Source: Ambulatory Visit | Attending: Family Medicine | Admitting: Family Medicine

## 2023-09-30 VITALS — BP 122/70 | HR 81 | Wt 143.0 lb

## 2023-09-30 DIAGNOSIS — S2249XA Multiple fractures of ribs, unspecified side, initial encounter for closed fracture: Secondary | ICD-10-CM

## 2023-09-30 DIAGNOSIS — S270XXD Traumatic pneumothorax, subsequent encounter: Secondary | ICD-10-CM | POA: Insufficient documentation

## 2023-09-30 DIAGNOSIS — S270XXA Traumatic pneumothorax, initial encounter: Secondary | ICD-10-CM | POA: Diagnosis not present

## 2023-09-30 DIAGNOSIS — R062 Wheezing: Secondary | ICD-10-CM | POA: Diagnosis not present

## 2023-09-30 DIAGNOSIS — E876 Hypokalemia: Secondary | ICD-10-CM | POA: Diagnosis not present

## 2023-09-30 DIAGNOSIS — S2249XD Multiple fractures of ribs, unspecified side, subsequent encounter for fracture with routine healing: Secondary | ICD-10-CM

## 2023-09-30 DIAGNOSIS — J939 Pneumothorax, unspecified: Secondary | ICD-10-CM | POA: Diagnosis not present

## 2023-09-30 DIAGNOSIS — Z9289 Personal history of other medical treatment: Secondary | ICD-10-CM

## 2023-09-30 LAB — CBC WITH DIFFERENTIAL/PLATELET
Basophils Absolute: 0.1 10*3/uL (ref 0.0–0.1)
Basophils Relative: 0.8 % (ref 0.0–3.0)
Eosinophils Absolute: 0 10*3/uL (ref 0.0–0.7)
Eosinophils Relative: 0.6 % (ref 0.0–5.0)
HCT: 38.4 % (ref 36.0–46.0)
Hemoglobin: 12.7 g/dL (ref 12.0–15.0)
Lymphocytes Relative: 19.3 % (ref 12.0–46.0)
Lymphs Abs: 1.5 10*3/uL (ref 0.7–4.0)
MCHC: 33 g/dL (ref 30.0–36.0)
MCV: 95.2 fl (ref 78.0–100.0)
Monocytes Absolute: 0.5 10*3/uL (ref 0.1–1.0)
Monocytes Relative: 6.4 % (ref 3.0–12.0)
Neutro Abs: 5.6 10*3/uL (ref 1.4–7.7)
Neutrophils Relative %: 72.9 % (ref 43.0–77.0)
Platelets: 402 10*3/uL — ABNORMAL HIGH (ref 150.0–400.0)
RBC: 4.03 Mil/uL (ref 3.87–5.11)
RDW: 13.5 % (ref 11.5–15.5)
WBC: 7.7 10*3/uL (ref 4.0–10.5)

## 2023-09-30 LAB — BASIC METABOLIC PANEL WITH GFR
BUN: 11 mg/dL (ref 6–23)
CO2: 27 meq/L (ref 19–32)
Calcium: 8.9 mg/dL (ref 8.4–10.5)
Chloride: 104 meq/L (ref 96–112)
Creatinine, Ser: 0.68 mg/dL (ref 0.40–1.20)
GFR: 83.58 mL/min (ref >60.00)
Glucose, Bld: 87 mg/dL (ref 70–99)
Potassium: 4.3 meq/L (ref 3.5–5.1)
Sodium: 138 meq/L (ref 135–145)

## 2023-09-30 MED ORDER — IPRATROPIUM-ALBUTEROL 0.5-2.5 (3) MG/3ML IN SOLN
3.0000 mL | RESPIRATORY_TRACT | Status: AC
  Administered 2023-09-30: 3 mL via RESPIRATORY_TRACT

## 2023-09-30 MED ORDER — DOXYCYCLINE HYCLATE 100 MG PO TABS
100.0000 mg | ORAL_TABLET | Freq: Two times a day (BID) | ORAL | 0 refills | Status: DC
Start: 2023-09-30 — End: 2023-10-17

## 2023-09-30 MED ORDER — TIZANIDINE HCL 4 MG PO TABS: 2.0000 mg | ORAL_TABLET | Freq: Three times a day (TID) | ORAL | 2 refills | Status: DC | PRN

## 2023-09-30 MED ORDER — PREDNISONE 20 MG PO TABS: ORAL_TABLET | ORAL | 0 refills | Status: DC

## 2023-09-30 MED ORDER — XYZAL ALLERGY 24HR 5 MG PO TABS: 5.0000 mg | ORAL_TABLET | Freq: Every evening | ORAL | 3 refills | Status: DC

## 2023-09-30 MED ORDER — TRAMADOL HCL 50 MG PO TABS: 50.0000 mg | ORAL_TABLET | Freq: Three times a day (TID) | ORAL | 0 refills | Status: AC | PRN

## 2023-09-30 NOTE — Telephone Encounter (Signed)
 Please call patient Her chest x-ray today shows resolution of the pneumothorax.  Her lung is inflated appropriately.  No signs of pneumonia on x-ray present. Suspect this is a significant COPD exacerbation, continue the prednisone  and the antibiotic prescribed. Follow-up in 4 weeks if needed

## 2023-09-30 NOTE — Progress Notes (Signed)
 Ana Johnston , Oct 18, 1945, 78 y.o., female MRN: [id number] Patient Care Team    Relationship Specialty Notifications Start End  Mariel Shope, DO PCP - General Family Medicine  04/14/23   Mechele Spiegel, MD Referring Physician Gastroenterology  05/13/23   Avanell Leigh, MD Consulting Physician Cardiology  05/16/23   Tat, Von Grumbling, DO Consulting Physician Neurology  09/18/23     Chief Complaint  Patient presents with   Fall    Has constant wheezing and yellow mucus since fall     Subjective:  Ana Johnston  is a 78 y.o. female presents for hospital follow up after recent admission on 09/18/2023 for primary diagnosis rib fracture and pneumothorax.  Patient was discharged on 09/22/2023 to home with home health. Patients discharge summary has been reviewed, as well as all labs/image studies obtained during hospitalization.  Medication reconciliation completed today.  Patients hospital course: Patient has a history of falls and recently had a fall when she was going to the bathroom she fell against the toilet on her right side.  She was seen at [facility] emergency room and noted to have crepitus and right-sided chest wall fractures CT resulted with moderate anterior pneumothorax with posterior rib fractures.  Trauma surgery was consulted. Since hospital discharge patient reports she is feeling much better but still has some wheezing and coughing up yellow mucus.  She states she needs refills on her muscle relaxer, potassium, Xyzal  and her pain med today.  Patient had a glass of alcohol with her dinner, which she feels made her off balance when using the bathroom.  She denied any chest pain or shortness of breath prior to the fall. She is under the care of cardiology  Patient still having some fatigue, but denies any fevers or chills since hospital discharge.  She has noticed some weakness but is working with physical therapy.  She completed the potassium  supplements provided.  No results for input(s): "HGB", "HCT", "WBC", "PLT" in the last 168 hours.    Latest Ref Rng & Units 09/30/2023   12:06 PM 09/21/2023    7:54 AM 09/19/2023    4:03 AM  CMP  Glucose 70 - 99 mg/dL 87  865  784   BUN 6 - 23 mg/dL 11  9  13    Creatinine 0.40 - 1.20 mg/dL 6.96  2.95  2.84   Sodium 135 - 145 mEq/L 138  136  132   Potassium 3.5 - 5.1 mEq/L 4.3  4.5  4.0   Chloride 96 - 112 mEq/L 104  100  98   CO2 19 - 32 mEq/L 27  27  22    Calcium  8.4 - 10.5 mg/dL 8.9  13.2  9.3     DG CHEST PORT 1 VIEW Result Date: 09/22/2023 IMPRESSION: 1. Trace right apical pneumothorax. 2. Known right rib fractures better seen on CT chest 09/18/2023.   DG Chest 2 View Result Date: 09/21/2023  IMPRESSION: 1. Stable appearance of small right apical pneumothorax. 2. Stable appearance of right mid and left base subsegmental atelectasis. 3. Stable soft tissue gas within the right supraclavicular region and extending along the right chest wall.   VAS US  CAROTID Result Date: 09/20/2023 Summary: Right Carotid: There is no evidence of stenosis in the right ICA. The                extracranial vessels were near-normal with only minimal wall  thickening or plaque.  Left Carotid: There is no evidence of stenosis in the left ICA. The extracranial               vessels were near-normal with only minimal wall thickening or               plaque.  Vertebrals:  Bilateral vertebral arteries demonstrate antegrade flow.  Subclavians: Normal flow hemodynamics were seen in bilateral subclavian              DG Chest 2 View Result Date: 09/20/2023  IMPRESSION: 1. Regressed but not resolved right side pneumothorax, chest wall subcutaneous gas. 2. Mildly improved lung volumes and ventilation since yesterday. Curvilinear atelectasis in the right lower lobe.   DG CHEST PORT 1 VIEW Result Date: 09/19/2023  IMPRESSION: 1. Ongoing, mildly increased body wall subcutaneous emphysema but no right  pneumothorax visible now. 2. Right rib fractures better demonstrated by CT. No new cardiopulmonary abnormality.  ECHOCARDIOGRAM COMPLETE Result Date: 09/18/2023  IMPRESSIONS   1. Left ventricular ejection fraction, by estimation, is 50 to 55%. The left ventricle has low normal function. The left ventricle demonstrates regional wall motion abnormalities (see scoring diagram/findings for description). Left ventricular diastolic  parameters are consistent with Grade I diastolic dysfunction (impaired relaxation).   2. Right ventricular systolic function is normal. The right ventricular size is normal.   3. The mitral valve is normal in structure. No evidence of mitral valve regurgitation. No evidence of mitral stenosis.   4. The aortic valve is tricuspid. Aortic valve regurgitation is not visualized. No aortic stenosis is present.   5. The inferior vena cava is normal in size with greater than 50% respiratory variability, suggesting right atrial pressure of 3 mmHg.   DG CHEST PORT 1 VIEW Result Date: 09/18/2023  IMPRESSION: 1. Small right apical pneumothorax has become more conspicuous in the interval. 2. Multiple acute right rib fractures were better characterized on CT imaging earlier today.   CT CHEST ABDOMEN PELVIS W CONTRAST Addendum Date: 09/18/2023 ADDENDUM REPORT: 09/18/2023 05:59 ADDENDUM: Study discussed by telephone with Dr. Townsend Freud on 09/18/2023 at 0504 hours. Electronically Signed   By: Marlise Simpers M.D.   On: 09/18/2023 05:59   Result Date: 09/18/2023 CLINICAL DATA:  78 year old female status post fall while walking to bathroom.  IMPRESSION: 1. Positive for Acute, displaced fractures of the right ribs 7 through 10. Severely displaced right 8th rib fracture, and the right 8th and 9th ribs both fractured in two places. 2. Associated small to moderate Right Pneumothorax. Trace right hemothorax. Right perihilar and middle lobe lung opacity indeterminate for pulmonary contusion versus  atelectasis. 3. Associated posttraumatic subcutaneous emphysema along the right chest wall and flank. 4. No other acute traumatic injury identified in the chest, abdomen, or pelvis. 5.  Aortic Atherosclerosis (ICD10-I70.0).   DG Chest Port 1 View Result Date: 09/18/2023  IMPRESSION: 1. Small right medial apical pneumothorax. The CT also demonstrated anterior pneumothorax extending inferiorly to the chest base but this is not well seen AP. This has probably not notably changed. On CT it is estimated up to 20% of the chest volume. 2. Extensive right chest wall/supraclavicular fossa soft tissue emphysema. 3. Right seventh through tenth rib fractures, some with comminution. 4. Hazy increased opacity in the right upper lobe and right base which is probably due to contusions. 5. Aortic atherosclerosis.   CT Cervical Spine Wo Contrast Result Date: 09/18/2023 IMPRESSION: 1. No acute traumatic injury identified in the cervical spine. 2.  Posttraumatic subcutaneous emphysema appears to be tracking up from the chest into the right posterolateral neck. See Chest CT reported separately. 3. 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   CT Head Wo Contrast Result Date: 09/18/2023  IMPRESSION: 1.  No acute traumatic injury identified. 2. Negative for age noncontrast CT appearance of the brain.       05/16/2023    8:26 AM  Depression screen PHQ 2/9  Decreased Interest 0  Down, Depressed, Hopeless 0  PHQ - 2 Score 0    No Known Allergies Social History   Tobacco Use   Smoking status: Former    Types: Cigarettes    Passive exposure: Never   Smokeless tobacco: Never   Tobacco comments:    1/2 ppd x 50+ years  Substance Use Topics   Alcohol use: Yes    Comment: 2 glasses of wine a day   Past Medical History:  Diagnosis Date   Acute pulmonary embolism without acute cor pulmonale (HCC) 05/17/2022   Formatting of this note might be different from the original. CTA  of the Chest (05/13/2022): Small subsegmental right lower lobe pulmonary embolus, likely subacute in age given its relatively peripheral location within the vessel.     Arthritis    Atypical chest pain 04/09/2022   Atypical chest pain     Clostridium difficile diarrhea 05/25/2022   Colitis    Depression    Diverticulitis    Dyspnea on exertion 04/09/2022   Dyspnea on exertion     History of Clostridioides difficile colitis 09/18/2023   Hypertension    Seasonal allergies    Past Surgical History:  Procedure Laterality Date   ABDOMINAL HYSTERECTOMY     BACK SURGERY  09/2016   CATARACT EXTRACTION, BILATERAL     CHOLECYSTECTOMY     1966   COLONOSCOPY  09/2022   TONSILLECTOMY AND ADENOIDECTOMY     1965   Family History  Problem Relation Age of Onset   Diabetes Mother    Heart attack Father    Hearing loss Sister    Breast cancer Sister    Hyperlipidemia Brother    Heart attack Brother    Hearing loss Brother    Hypertension Son    Hyperlipidemia Son    Alcohol abuse Son    Allergies as of 09/30/2023   No Known Allergies      Medication List        Accurate as of September 30, 2023 11:59 PM. If you have any questions, ask your nurse or doctor.          PAUSE taking these medications    meloxicam  15 MG tablet Wait to take this until your doctor or other care provider tells you to start again. Commonly known as: MOBIC  Take 1 tablet (15 mg total) by mouth daily.       STOP taking these medications    B COMPLEX-B12 PO Stopped by: Napolean Backbone   FISH OIL PO Stopped by: Napolean Backbone   hyoscyamine 0.375 MG 12 hr tablet Commonly known as: LEVBID Stopped by: Napolean Backbone   loratadine 10 MG tablet Commonly known as: CLARITIN Stopped by: Winnie Umali   magnesium  30 MG tablet Stopped by: Napolean Backbone   oxyCODONE  5 MG immediate release tablet Commonly known as: Oxy IR/ROXICODONE  Stopped by: Napolean Backbone   potassium chloride  SA 20 MEQ tablet Commonly  known as: KLOR-CON  M Stopped by: Gabe Glace   VITAMIN B-12 PO Stopped by: Napolean Backbone  TAKE these medications    acetaminophen  500 MG tablet Commonly known as: TYLENOL  Take 2 tablets (1,000 mg total) by mouth every 8 (eight) hours as needed.   albuterol  108 (90 Base) MCG/ACT inhaler Commonly known as: VENTOLIN  HFA Inhale 2 puffs into the lungs every 6 (six) hours as needed for wheezing or shortness of breath.   amLODipine  5 MG tablet Commonly known as: NORVASC  Take 1 tablet (5 mg total) by mouth daily.   atorvastatin  20 MG tablet Commonly known as: LIPITOR Take 1 tablet (20 mg total) by mouth daily.   budesonide  3 MG 24 hr capsule Commonly known as: ENTOCORT EC  Take 6 mg by mouth at bedtime.   busPIRone  5 MG tablet Commonly known as: BUSPAR  Take 1 tablet (5 mg total) by mouth 2 (two) times daily.   calcium -vitamin D 500-200 MG-UNIT tablet Commonly known as: OSCAL WITH D Take 1 tablet by mouth daily with breakfast.   colestipol 1 g tablet Commonly known as: COLESTID Take 1 g by mouth at bedtime.   doxycycline  100 MG tablet Commonly known as: VIBRA -TABS Take 1 tablet (100 mg total) by mouth 2 (two) times daily. Started by: Napolean Backbone   Melatonin 10 MG Tabs Take 20 mg by mouth at bedtime as needed (sleep).   montelukast  10 MG tablet Commonly known as: SINGULAIR  Take 1 tablet (10 mg total) by mouth daily. What changed: when to take this   predniSONE  20 MG tablet Commonly known as: DELTASONE  60 mg x3d, 40 mg x3d, 20 mg x2d, 10 mg x2d Started by: Napolean Backbone   sertraline  50 MG tablet Commonly known as: ZOLOFT  Take 1 tablet (50 mg total) by mouth daily. What changed: when to take this   tiZANidine  4 MG tablet Commonly known as: Zanaflex  Take 0.5-1 tablets (2-4 mg total) by mouth every 8 (eight) hours as needed for muscle spasms.   traMADol  50 MG tablet Commonly known as: ULTRAM  Take 1-2 tablets (50-100 mg total) by mouth every 8 (eight)  hours as needed for up to 5 days. Started by: Jasai Sorg   Trelegy Ellipta  200-62.5-25 MCG/ACT Aepb Generic drug: Fluticasone -Umeclidin-Vilant Inhale 1 Inhalation into the lungs daily.   Vitamin D3 25 MCG (1000 UT) Chew Chew 1 tablet by mouth daily.   Xyzal  Allergy  24HR 5 MG tablet Generic drug: levocetirizine Take 1 tablet (5 mg total) by mouth every evening.        All past medical history, surgical history, allergies, family history, immunizations and medications were updated in the EMR today and reviewed under the history and medication portions of their EMR.      ROS: Negative, with the exception of above mentioned in HPI   Objective:  BP 122/70   Pulse 81   Wt 143 lb (64.9 kg)   SpO2 96%   BMI 27.93 kg/m  Body mass index is 27.93 kg/m. Physical Exam Vitals and nursing note reviewed.  Constitutional:      General: She is not in acute distress.    Appearance: Normal appearance. She is not ill-appearing, toxic-appearing or diaphoretic.  HENT:     Head: Normocephalic and atraumatic.  Eyes:     General: No scleral icterus.       Right eye: No discharge.        Left eye: No discharge.     Extraocular Movements: Extraocular movements intact.     Conjunctiva/sclera: Conjunctivae normal.     Pupils: Pupils are equal, round, and reactive to light.  Cardiovascular:  Rate and Rhythm: Normal rate and regular rhythm.  Pulmonary:     Effort: Pulmonary effort is normal. No respiratory distress.     Breath sounds: Wheezing and rhonchi present. No rales.     Comments: Mildly diminished air sounds although present bilateral upper and lower lung fields Musculoskeletal:     Right lower leg: No edema.     Left lower leg: No edema.  Skin:    General: Skin is warm.     Findings: No rash.  Neurological:     Mental Status: She is alert and oriented to person, place, and time. Mental status is at baseline.     Motor: No weakness.     Gait: Gait normal.  Psychiatric:         Mood and Affect: Mood normal.        Behavior: Behavior normal.        Thought Content: Thought content normal.        Judgment: Judgment normal.      Assessment/Plan: Ana Johnston is a 78 y.o. female present for OV for Hospital discharge follow up Traumatic pneumothorax, subsequent encounter (Primary)/wheezing - CBC w/Diff - DG Chest 2 View; Future - Basic Metabolic Panel (BMET) - ipratropium-albuterol  (DUONEB) 0.5-2.5 (3) MG/3ML nebulizer solution 3 mL> wheezing subsided the air movement increased> patient felt improved. -Prednisone  taper prescribed -Doxycycline  prescribed Patient will be called with chest x-ray results, and further evaluation discussed at that time -Refill for Xyzal  nightly  Hypokalemia: Will wait on laboratory results, if needed will prescribe additional potassium supplement.  Rib fractures: Discussed the importance of taking in deep breaths despite discomfort, to avoid progression to pneumonia. Would not advise to continue the oxycodone  at this time, but start to taper down pain regimen:    -Tramadol  1-2 tabs every 8 hours as needed only prescribed.   -Tenacity 2/4 mg every 8 hours twice daily prescribed -Avoid all alcohol consumption and taking pain medicines or muscle relaxers   Reviewed expectations re: course of current medical issues. Discussed self-management of symptoms. Outlined signs and symptoms indicating need for more acute intervention. Patient verbalized understanding and all questions were answered. Patient received an After-Visit Summary. Any changes in medications were reviewed and patient was provided with updated med list with their AVS.     Orders Placed This Encounter  Procedures   DG Chest 2 View   CBC w/Diff   Basic Metabolic Panel (BMET)   Meds ordered this encounter  Medications   tiZANidine  (ZANAFLEX ) 4 MG tablet    Sig: Take 0.5-1 tablets (2-4 mg total) by mouth every 8 (eight) hours as needed for muscle  spasms.    Dispense:  60 tablet    Refill:  2   XYZAL  ALLERGY  24HR 5 MG tablet    Sig: Take 1 tablet (5 mg total) by mouth every evening.    Dispense:  90 tablet    Refill:  3   predniSONE  (DELTASONE ) 20 MG tablet    Sig: 60 mg x3d, 40 mg x3d, 20 mg x2d, 10 mg x2d    Dispense:  18 tablet    Refill:  0   traMADol  (ULTRAM ) 50 MG tablet    Sig: Take 1-2 tablets (50-100 mg total) by mouth every 8 (eight) hours as needed for up to 5 days.    Dispense:  30 tablet    Refill:  0   ipratropium-albuterol  (DUONEB) 0.5-2.5 (3) MG/3ML nebulizer solution 3 mL   doxycycline  (VIBRA -TABS) 100 MG tablet  Sig: Take 1 tablet (100 mg total) by mouth 2 (two) times daily.    Dispense:  20 tablet    Refill:  0     Note is dictated utilizing voice recognition software. Although note has been proof read prior to signing, occasional typographical errors still can be missed. If any questions arise, please do not hesitate to call for verification.   electronically signed by:  Napolean Backbone, DO  [facility] [place]

## 2023-09-30 NOTE — Telephone Encounter (Signed)
 LM for pt to return call to discuss.

## 2023-09-30 NOTE — Telephone Encounter (Signed)
 [name] to P [facility] Clinical (supporting Mariel Shope, DO)  AW    09/30/23  3:46 PM Hello Ana Johnston I went to [facility] to pick up my medications and I only had 3. Didn't have an antibiotics . Just making sure you said I was going to be taking Doxycycline? Maybe it want ready

## 2023-09-30 NOTE — Patient Instructions (Signed)

## 2023-10-01 ENCOUNTER — Encounter: Payer: Self-pay | Admitting: Family Medicine

## 2023-10-01 DIAGNOSIS — Z7951 Long term (current) use of inhaled steroids: Secondary | ICD-10-CM | POA: Diagnosis not present

## 2023-10-01 DIAGNOSIS — M5030 Other cervical disc degeneration, unspecified cervical region: Secondary | ICD-10-CM | POA: Diagnosis not present

## 2023-10-01 DIAGNOSIS — E785 Hyperlipidemia, unspecified: Secondary | ICD-10-CM | POA: Diagnosis not present

## 2023-10-01 DIAGNOSIS — M8589 Other specified disorders of bone density and structure, multiple sites: Secondary | ICD-10-CM | POA: Diagnosis not present

## 2023-10-01 DIAGNOSIS — F32A Depression, unspecified: Secondary | ICD-10-CM | POA: Diagnosis not present

## 2023-10-01 DIAGNOSIS — M545 Low back pain, unspecified: Secondary | ICD-10-CM | POA: Diagnosis not present

## 2023-10-01 DIAGNOSIS — I700 Atherosclerosis of aorta: Secondary | ICD-10-CM | POA: Diagnosis not present

## 2023-10-01 DIAGNOSIS — G8929 Other chronic pain: Secondary | ICD-10-CM | POA: Diagnosis not present

## 2023-10-01 DIAGNOSIS — I119 Hypertensive heart disease without heart failure: Secondary | ICD-10-CM | POA: Diagnosis not present

## 2023-10-01 DIAGNOSIS — M4802 Spinal stenosis, cervical region: Secondary | ICD-10-CM | POA: Diagnosis not present

## 2023-10-01 DIAGNOSIS — F411 Generalized anxiety disorder: Secondary | ICD-10-CM | POA: Diagnosis not present

## 2023-10-01 DIAGNOSIS — S2241XD Multiple fractures of ribs, right side, subsequent encounter for fracture with routine healing: Secondary | ICD-10-CM | POA: Diagnosis not present

## 2023-10-01 DIAGNOSIS — I2510 Atherosclerotic heart disease of native coronary artery without angina pectoris: Secondary | ICD-10-CM | POA: Diagnosis not present

## 2023-10-01 DIAGNOSIS — M150 Primary generalized (osteo)arthritis: Secondary | ICD-10-CM | POA: Diagnosis not present

## 2023-10-01 DIAGNOSIS — Z86711 Personal history of pulmonary embolism: Secondary | ICD-10-CM | POA: Diagnosis not present

## 2023-10-01 DIAGNOSIS — T797XXD Traumatic subcutaneous emphysema, subsequent encounter: Secondary | ICD-10-CM | POA: Diagnosis not present

## 2023-10-01 DIAGNOSIS — J449 Chronic obstructive pulmonary disease, unspecified: Secondary | ICD-10-CM | POA: Diagnosis not present

## 2023-10-01 DIAGNOSIS — Z87891 Personal history of nicotine dependence: Secondary | ICD-10-CM | POA: Diagnosis not present

## 2023-10-01 DIAGNOSIS — S272XXD Traumatic hemopneumothorax, subsequent encounter: Secondary | ICD-10-CM | POA: Diagnosis not present

## 2023-10-01 DIAGNOSIS — Z9181 History of falling: Secondary | ICD-10-CM | POA: Diagnosis not present

## 2023-10-02 ENCOUNTER — Telehealth: Payer: Self-pay

## 2023-10-02 NOTE — Telephone Encounter (Signed)
 Pt thought she had a albuterol  inhaler and she does not. She states that the breathing treatment done in office really helped her and she is now scheduled for 05/05 @ 920 AM for reval and possible med change

## 2023-10-02 NOTE — Telephone Encounter (Signed)
 Per review of patients med list, she has an active script for albuterol  written 05/16/2023 for 12 fills.  I recommend she pick this up and use it as discussed. When used appropriately, the inhaler is just as effective as the nebs.

## 2023-10-02 NOTE — Telephone Encounter (Signed)
 Spoke with patient regarding results/recommendations.

## 2023-10-02 NOTE — Telephone Encounter (Signed)
 Copied from CRM [id number]. Topic: Clinical - Medical Advice >> Oct 02, 2023  8:54 AM Ovid Blow wrote: Reason for CRM: Patient stated she would like Dr. Marylee Snowball Nurse to give her a call and congestion. Patient has COPD and has a lot of broken mucus in chest

## 2023-10-06 ENCOUNTER — Ambulatory Visit: Admitting: Family Medicine

## 2023-10-06 ENCOUNTER — Telehealth: Payer: Self-pay

## 2023-10-06 NOTE — Telephone Encounter (Signed)
Placed in PCP office for review/signature

## 2023-10-06 NOTE — Telephone Encounter (Signed)
 document Home Health Certificate (Order ID [id number]), to be filled out by provider. Patient requested to send it back via Fax within 7-days. Document is located in providers tray at front office.

## 2023-10-07 DIAGNOSIS — F32A Depression, unspecified: Secondary | ICD-10-CM | POA: Diagnosis not present

## 2023-10-07 DIAGNOSIS — M545 Low back pain, unspecified: Secondary | ICD-10-CM | POA: Diagnosis not present

## 2023-10-07 DIAGNOSIS — I2510 Atherosclerotic heart disease of native coronary artery without angina pectoris: Secondary | ICD-10-CM | POA: Diagnosis not present

## 2023-10-07 DIAGNOSIS — S272XXD Traumatic hemopneumothorax, subsequent encounter: Secondary | ICD-10-CM | POA: Diagnosis not present

## 2023-10-07 DIAGNOSIS — Z9181 History of falling: Secondary | ICD-10-CM | POA: Diagnosis not present

## 2023-10-07 DIAGNOSIS — F411 Generalized anxiety disorder: Secondary | ICD-10-CM | POA: Diagnosis not present

## 2023-10-07 DIAGNOSIS — E785 Hyperlipidemia, unspecified: Secondary | ICD-10-CM | POA: Diagnosis not present

## 2023-10-07 DIAGNOSIS — T797XXD Traumatic subcutaneous emphysema, subsequent encounter: Secondary | ICD-10-CM | POA: Diagnosis not present

## 2023-10-07 DIAGNOSIS — G8929 Other chronic pain: Secondary | ICD-10-CM | POA: Diagnosis not present

## 2023-10-07 DIAGNOSIS — M8589 Other specified disorders of bone density and structure, multiple sites: Secondary | ICD-10-CM | POA: Diagnosis not present

## 2023-10-07 DIAGNOSIS — M5030 Other cervical disc degeneration, unspecified cervical region: Secondary | ICD-10-CM | POA: Diagnosis not present

## 2023-10-07 DIAGNOSIS — Z7951 Long term (current) use of inhaled steroids: Secondary | ICD-10-CM | POA: Diagnosis not present

## 2023-10-07 DIAGNOSIS — Z87891 Personal history of nicotine dependence: Secondary | ICD-10-CM | POA: Diagnosis not present

## 2023-10-07 DIAGNOSIS — M150 Primary generalized (osteo)arthritis: Secondary | ICD-10-CM | POA: Diagnosis not present

## 2023-10-07 DIAGNOSIS — I700 Atherosclerosis of aorta: Secondary | ICD-10-CM | POA: Diagnosis not present

## 2023-10-07 DIAGNOSIS — S2241XD Multiple fractures of ribs, right side, subsequent encounter for fracture with routine healing: Secondary | ICD-10-CM | POA: Diagnosis not present

## 2023-10-07 DIAGNOSIS — J449 Chronic obstructive pulmonary disease, unspecified: Secondary | ICD-10-CM | POA: Diagnosis not present

## 2023-10-07 DIAGNOSIS — M4802 Spinal stenosis, cervical region: Secondary | ICD-10-CM | POA: Diagnosis not present

## 2023-10-07 DIAGNOSIS — I119 Hypertensive heart disease without heart failure: Secondary | ICD-10-CM | POA: Diagnosis not present

## 2023-10-07 DIAGNOSIS — Z86711 Personal history of pulmonary embolism: Secondary | ICD-10-CM | POA: Diagnosis not present

## 2023-10-07 NOTE — Telephone Encounter (Signed)
 Signed and placed in CMA basket

## 2023-10-07 NOTE — Telephone Encounter (Signed)
 Forms faxed

## 2023-10-09 DIAGNOSIS — T797XXD Traumatic subcutaneous emphysema, subsequent encounter: Secondary | ICD-10-CM | POA: Diagnosis not present

## 2023-10-09 DIAGNOSIS — G8929 Other chronic pain: Secondary | ICD-10-CM | POA: Diagnosis not present

## 2023-10-09 DIAGNOSIS — M150 Primary generalized (osteo)arthritis: Secondary | ICD-10-CM | POA: Diagnosis not present

## 2023-10-09 DIAGNOSIS — I119 Hypertensive heart disease without heart failure: Secondary | ICD-10-CM | POA: Diagnosis not present

## 2023-10-09 DIAGNOSIS — I2510 Atherosclerotic heart disease of native coronary artery without angina pectoris: Secondary | ICD-10-CM | POA: Diagnosis not present

## 2023-10-09 DIAGNOSIS — Z7951 Long term (current) use of inhaled steroids: Secondary | ICD-10-CM | POA: Diagnosis not present

## 2023-10-09 DIAGNOSIS — S272XXD Traumatic hemopneumothorax, subsequent encounter: Secondary | ICD-10-CM | POA: Diagnosis not present

## 2023-10-09 DIAGNOSIS — F32A Depression, unspecified: Secondary | ICD-10-CM | POA: Diagnosis not present

## 2023-10-09 DIAGNOSIS — M5030 Other cervical disc degeneration, unspecified cervical region: Secondary | ICD-10-CM | POA: Diagnosis not present

## 2023-10-09 DIAGNOSIS — J449 Chronic obstructive pulmonary disease, unspecified: Secondary | ICD-10-CM | POA: Diagnosis not present

## 2023-10-09 DIAGNOSIS — Z87891 Personal history of nicotine dependence: Secondary | ICD-10-CM | POA: Diagnosis not present

## 2023-10-09 DIAGNOSIS — E785 Hyperlipidemia, unspecified: Secondary | ICD-10-CM | POA: Diagnosis not present

## 2023-10-09 DIAGNOSIS — Z86711 Personal history of pulmonary embolism: Secondary | ICD-10-CM | POA: Diagnosis not present

## 2023-10-09 DIAGNOSIS — I700 Atherosclerosis of aorta: Secondary | ICD-10-CM | POA: Diagnosis not present

## 2023-10-09 DIAGNOSIS — S2241XD Multiple fractures of ribs, right side, subsequent encounter for fracture with routine healing: Secondary | ICD-10-CM | POA: Diagnosis not present

## 2023-10-09 DIAGNOSIS — F411 Generalized anxiety disorder: Secondary | ICD-10-CM | POA: Diagnosis not present

## 2023-10-09 DIAGNOSIS — M545 Low back pain, unspecified: Secondary | ICD-10-CM | POA: Diagnosis not present

## 2023-10-09 DIAGNOSIS — M8589 Other specified disorders of bone density and structure, multiple sites: Secondary | ICD-10-CM | POA: Diagnosis not present

## 2023-10-09 DIAGNOSIS — M4802 Spinal stenosis, cervical region: Secondary | ICD-10-CM | POA: Diagnosis not present

## 2023-10-09 DIAGNOSIS — Z9181 History of falling: Secondary | ICD-10-CM | POA: Diagnosis not present

## 2023-10-13 DIAGNOSIS — M150 Primary generalized (osteo)arthritis: Secondary | ICD-10-CM | POA: Diagnosis not present

## 2023-10-13 DIAGNOSIS — F32A Depression, unspecified: Secondary | ICD-10-CM | POA: Diagnosis not present

## 2023-10-13 DIAGNOSIS — M8589 Other specified disorders of bone density and structure, multiple sites: Secondary | ICD-10-CM | POA: Diagnosis not present

## 2023-10-13 DIAGNOSIS — Z87891 Personal history of nicotine dependence: Secondary | ICD-10-CM | POA: Diagnosis not present

## 2023-10-13 DIAGNOSIS — Z9181 History of falling: Secondary | ICD-10-CM | POA: Diagnosis not present

## 2023-10-13 DIAGNOSIS — M4802 Spinal stenosis, cervical region: Secondary | ICD-10-CM | POA: Diagnosis not present

## 2023-10-13 DIAGNOSIS — Z86711 Personal history of pulmonary embolism: Secondary | ICD-10-CM | POA: Diagnosis not present

## 2023-10-13 DIAGNOSIS — M545 Low back pain, unspecified: Secondary | ICD-10-CM | POA: Diagnosis not present

## 2023-10-13 DIAGNOSIS — M5030 Other cervical disc degeneration, unspecified cervical region: Secondary | ICD-10-CM | POA: Diagnosis not present

## 2023-10-13 DIAGNOSIS — F411 Generalized anxiety disorder: Secondary | ICD-10-CM | POA: Diagnosis not present

## 2023-10-13 DIAGNOSIS — J449 Chronic obstructive pulmonary disease, unspecified: Secondary | ICD-10-CM | POA: Diagnosis not present

## 2023-10-13 DIAGNOSIS — I700 Atherosclerosis of aorta: Secondary | ICD-10-CM | POA: Diagnosis not present

## 2023-10-13 DIAGNOSIS — S272XXD Traumatic hemopneumothorax, subsequent encounter: Secondary | ICD-10-CM | POA: Diagnosis not present

## 2023-10-13 DIAGNOSIS — I2510 Atherosclerotic heart disease of native coronary artery without angina pectoris: Secondary | ICD-10-CM | POA: Diagnosis not present

## 2023-10-13 DIAGNOSIS — S2241XD Multiple fractures of ribs, right side, subsequent encounter for fracture with routine healing: Secondary | ICD-10-CM | POA: Diagnosis not present

## 2023-10-13 DIAGNOSIS — G8929 Other chronic pain: Secondary | ICD-10-CM | POA: Diagnosis not present

## 2023-10-13 DIAGNOSIS — I119 Hypertensive heart disease without heart failure: Secondary | ICD-10-CM | POA: Diagnosis not present

## 2023-10-13 DIAGNOSIS — T797XXD Traumatic subcutaneous emphysema, subsequent encounter: Secondary | ICD-10-CM | POA: Diagnosis not present

## 2023-10-13 DIAGNOSIS — Z7951 Long term (current) use of inhaled steroids: Secondary | ICD-10-CM | POA: Diagnosis not present

## 2023-10-13 DIAGNOSIS — E785 Hyperlipidemia, unspecified: Secondary | ICD-10-CM | POA: Diagnosis not present

## 2023-10-14 ENCOUNTER — Ambulatory Visit: Payer: Medicare Other | Admitting: Family Medicine

## 2023-10-15 ENCOUNTER — Ambulatory Visit: Admitting: Family Medicine

## 2023-10-15 DIAGNOSIS — M8589 Other specified disorders of bone density and structure, multiple sites: Secondary | ICD-10-CM | POA: Diagnosis not present

## 2023-10-15 DIAGNOSIS — Z86711 Personal history of pulmonary embolism: Secondary | ICD-10-CM | POA: Diagnosis not present

## 2023-10-15 DIAGNOSIS — I119 Hypertensive heart disease without heart failure: Secondary | ICD-10-CM | POA: Diagnosis not present

## 2023-10-15 DIAGNOSIS — M545 Low back pain, unspecified: Secondary | ICD-10-CM | POA: Diagnosis not present

## 2023-10-15 DIAGNOSIS — Z87891 Personal history of nicotine dependence: Secondary | ICD-10-CM | POA: Diagnosis not present

## 2023-10-15 DIAGNOSIS — M5030 Other cervical disc degeneration, unspecified cervical region: Secondary | ICD-10-CM | POA: Diagnosis not present

## 2023-10-15 DIAGNOSIS — Z7951 Long term (current) use of inhaled steroids: Secondary | ICD-10-CM | POA: Diagnosis not present

## 2023-10-15 DIAGNOSIS — F32A Depression, unspecified: Secondary | ICD-10-CM | POA: Diagnosis not present

## 2023-10-15 DIAGNOSIS — M150 Primary generalized (osteo)arthritis: Secondary | ICD-10-CM | POA: Diagnosis not present

## 2023-10-15 DIAGNOSIS — Z9181 History of falling: Secondary | ICD-10-CM | POA: Diagnosis not present

## 2023-10-15 DIAGNOSIS — F411 Generalized anxiety disorder: Secondary | ICD-10-CM | POA: Diagnosis not present

## 2023-10-15 DIAGNOSIS — T797XXD Traumatic subcutaneous emphysema, subsequent encounter: Secondary | ICD-10-CM | POA: Diagnosis not present

## 2023-10-15 DIAGNOSIS — E785 Hyperlipidemia, unspecified: Secondary | ICD-10-CM | POA: Diagnosis not present

## 2023-10-15 DIAGNOSIS — J449 Chronic obstructive pulmonary disease, unspecified: Secondary | ICD-10-CM | POA: Diagnosis not present

## 2023-10-15 DIAGNOSIS — G8929 Other chronic pain: Secondary | ICD-10-CM | POA: Diagnosis not present

## 2023-10-15 DIAGNOSIS — S2241XD Multiple fractures of ribs, right side, subsequent encounter for fracture with routine healing: Secondary | ICD-10-CM | POA: Diagnosis not present

## 2023-10-15 DIAGNOSIS — M4802 Spinal stenosis, cervical region: Secondary | ICD-10-CM | POA: Diagnosis not present

## 2023-10-15 DIAGNOSIS — I700 Atherosclerosis of aorta: Secondary | ICD-10-CM | POA: Diagnosis not present

## 2023-10-15 DIAGNOSIS — S272XXD Traumatic hemopneumothorax, subsequent encounter: Secondary | ICD-10-CM | POA: Diagnosis not present

## 2023-10-15 DIAGNOSIS — I2510 Atherosclerotic heart disease of native coronary artery without angina pectoris: Secondary | ICD-10-CM | POA: Diagnosis not present

## 2023-10-17 ENCOUNTER — Ambulatory Visit (INDEPENDENT_AMBULATORY_CARE_PROVIDER_SITE_OTHER): Admitting: Family Medicine

## 2023-10-17 ENCOUNTER — Encounter: Payer: Self-pay | Admitting: Family Medicine

## 2023-10-17 VITALS — BP 126/74 | HR 83 | Temp 98.5°F | Wt 140.0 lb

## 2023-10-17 DIAGNOSIS — S270XXA Traumatic pneumothorax, initial encounter: Secondary | ICD-10-CM

## 2023-10-17 DIAGNOSIS — J439 Emphysema, unspecified: Secondary | ICD-10-CM

## 2023-10-17 DIAGNOSIS — E782 Mixed hyperlipidemia: Secondary | ICD-10-CM | POA: Diagnosis not present

## 2023-10-17 DIAGNOSIS — F411 Generalized anxiety disorder: Secondary | ICD-10-CM

## 2023-10-17 DIAGNOSIS — I10 Essential (primary) hypertension: Secondary | ICD-10-CM

## 2023-10-17 DIAGNOSIS — I2510 Atherosclerotic heart disease of native coronary artery without angina pectoris: Secondary | ICD-10-CM

## 2023-10-17 DIAGNOSIS — S2241XA Multiple fractures of ribs, right side, initial encounter for closed fracture: Secondary | ICD-10-CM

## 2023-10-17 DIAGNOSIS — M150 Primary generalized (osteo)arthritis: Secondary | ICD-10-CM

## 2023-10-17 DIAGNOSIS — M8589 Other specified disorders of bone density and structure, multiple sites: Secondary | ICD-10-CM

## 2023-10-17 MED ORDER — SERTRALINE HCL 50 MG PO TABS: 50.0000 mg | ORAL_TABLET | Freq: Every day | ORAL | 1 refills | Status: DC

## 2023-10-17 MED ORDER — TRELEGY ELLIPTA 200-62.5-25 MCG/ACT IN AEPB: 1.0000 | INHALATION_SPRAY | Freq: Every day | RESPIRATORY_TRACT | 11 refills | Status: AC

## 2023-10-17 MED ORDER — BUSPIRONE HCL 5 MG PO TABS: 5.0000 mg | ORAL_TABLET | Freq: Two times a day (BID) | ORAL | 1 refills | Status: DC

## 2023-10-17 MED ORDER — MONTELUKAST SODIUM 10 MG PO TABS: 10.0000 mg | ORAL_TABLET | Freq: Every day | ORAL | 1 refills | Status: DC

## 2023-10-17 MED ORDER — LEVOCETIRIZINE DIHYDROCHLORIDE 5 MG PO TABS: 5.0000 mg | ORAL_TABLET | Freq: Every evening | ORAL | 3 refills | Status: AC

## 2023-10-17 MED ORDER — TIZANIDINE HCL 4 MG PO TABS: 2.0000 mg | ORAL_TABLET | Freq: Three times a day (TID) | ORAL | 2 refills | Status: AC | PRN

## 2023-10-17 MED ORDER — ALBUTEROL SULFATE HFA 108 (90 BASE) MCG/ACT IN AERS: 2.0000 | INHALATION_SPRAY | Freq: Four times a day (QID) | RESPIRATORY_TRACT | 11 refills | Status: AC | PRN

## 2023-10-17 MED ORDER — MELOXICAM 15 MG PO TABS: 15.0000 mg | ORAL_TABLET | Freq: Every day | ORAL | 1 refills | Status: DC | PRN

## 2023-10-17 MED ORDER — AMLODIPINE BESYLATE 5 MG PO TABS
5.0000 mg | ORAL_TABLET | Freq: Every day | ORAL | 1 refills | Status: DC
Start: 2023-10-17 — End: 2024-01-26

## 2023-10-17 NOTE — Progress Notes (Signed)
 Patient ID: Ana Johnston, female  DOB: Dec 23, 1945, 78 y.o.   MRN: [id number] Patient Care Team    Relationship Specialty Notifications Start End  Mariel Shope, DO PCP - General Family Medicine  04/14/23   Mechele Spiegel, MD Referring Physician Gastroenterology  05/13/23   Avanell Leigh, MD Consulting Physician Cardiology  05/16/23   Tat, Von Grumbling, DO Consulting Physician Neurology  09/18/23     Chief Complaint  Patient presents with   Hypertension   Hyperlipidemia    Subjective:  Ana Johnston is a 78 y.o.  female present for chronic condition management All past medical history, surgical history, allergies, family history, immunizations, medications and social history were updated in the electronic medical record today. All recent labs, ED visits and hospitalizations within the last year were reviewed.  Prednisone , Doxy, tramadol -restart Mobic  Hypertension/hyperlipidemia/CAD/family history of heart disease: Pt reports compliance with amlodipine  5 mg daily and HCTZ 12.5 mg as needed.  Patient denies chest pain, shortness of breath, dizziness or lower extremity edema.  t prescribed Lipitor 20 mg daily  Anxiety: Patient reports compliance with BuSpar  5 mg twice daily and Zoloft  50 mg daily.  She feels these medications work well for her.  COPD/recent traumatic pneumothorax: Patient is not currently established with pulmonology.  She recently was started on Trelegy, in place of Symbicort , and although it is more costly for her she feels it works better for her.  She uses albuterol  as needed.  She takes nightly Xyzal  and Singulair .  Recent pneumothorax secondary to rib fracture after fall, recovering well.  Chest x-ray repeat reassuring. She does feel like she is having a harder time getting back to her normal from a volume standpoint since injury/hospitalization.  Cervical spinal stenosis: Patient reports she has a history of spinal stenosis.  She has  received injections from a physician in [place] in the past.  She currently is prescribed meloxicam  15 mg daily. MRI spina cervical 06/12/2017 IMPRESSION:  1. No acute fracture or evidence for ligamentous injury. No abnormal  cord signal.  2. Mild edema within and surrounding right C3-4 facet, likely  degenerative.  3. Small effusions within the anterior C1-2 and bilateral atlanto  occipital joints without bone marrow edema, likely degenerative.  4. Cervical spondylosis greatest at the C5-6 level.  5. Multilevel mild foraminal stenosis with moderate right C3-4 and  moderate left C5-6 foraminal stenosis.  6. No significant canal stenosis.   Lymphocytic colitis: She is under the care of gastroenterology for this condition.  Dr. Algie Antis.     05/16/2023    8:26 AM  Depression screen PHQ 2/9  Decreased Interest 0  Down, Depressed, Hopeless 0  PHQ - 2 Score 0       No data to display                   05/16/2023    8:26 AM 07/04/2017    9:48 AM  Fall Risk   Falls in the past year? 1 Yes  Number falls in past yr: 0 2 or more  Injury with Fall? 1 Yes  Risk Factor Category   High Fall Risk  Risk for fall due to : History of fall(s)   Follow up Falls evaluation completed Falls evaluation completed     Immunization History  Administered Date(s) Administered   DT (Pediatric) 06/03/2004   Fluad Quad(high Dose 65+) 03/28/2021, 04/11/2022   Influenza Inj Mdck Quad Pf 04/01/2012   Influenza, High  Dose Seasonal PF 02/26/2013, 03/31/2015, 04/03/2017   Influenza, Seasonal, Injecte, Preservative Fre 04/01/2012   Influenza-Unspecified 04/17/2023   Moderna Sars-Covid-2 Vaccination 10/14/2019, 11/11/2019   PNEUMOCOCCAL CONJUGATE-20 04/17/2023   Pneumococcal Conjugate-13 03/31/2015   Pneumococcal Polysaccharide-23 02/19/2005, 03/01/2011, 08/18/2017   Tdap 04/20/2015, 02/01/2017   Unspecified SARS-COV-2 Vaccination 04/17/2023   Zoster Recombinant(Shingrix) 12/27/2017, 04/14/2018    Zoster, Live 06/04/2007    No results found.  Past Medical History:  Diagnosis Date   Acute pulmonary embolism without acute cor pulmonale (HCC) 05/17/2022   Formatting of this note might be different from the original. CTA of the Chest (05/13/2022): Small subsegmental right lower lobe pulmonary embolus, likely subacute in age given its relatively peripheral location within the vessel.     Arthritis    Atypical chest pain 04/09/2022   Atypical chest pain     Clostridium difficile diarrhea 05/25/2022   Colitis    Depression    Diverticulitis    Dyspnea on exertion 04/09/2022   Dyspnea on exertion     History of Clostridioides difficile colitis 09/18/2023   Hypertension    Seasonal allergies    No Known Allergies Past Surgical History:  Procedure Laterality Date   ABDOMINAL HYSTERECTOMY     BACK SURGERY  09/2016   CATARACT EXTRACTION, BILATERAL     CHOLECYSTECTOMY     1966   COLONOSCOPY  09/2022   TONSILLECTOMY AND ADENOIDECTOMY     1965   Family History  Problem Relation Age of Onset   Diabetes Mother    Heart attack Father    Hearing loss Sister    Breast cancer Sister    Hyperlipidemia Brother    Heart attack Brother    Hearing loss Brother    Hypertension Son    Hyperlipidemia Son    Alcohol abuse Son    Social History   Social History Narrative   Marital status/children/pets:  widowed, G2P2   Education/employment: 12th grade education, retired   Field seismologist:      -smoke alarm in the home:Yes     - wears seatbelt: Yes     - Feels safe in their relationships: Yes       Allergies as of 10/17/2023   No Known Allergies      Medication List        Accurate as of Oct 17, 2023  3:00 PM. If you have any questions, ask your nurse or doctor.          STOP taking these medications    doxycycline  100 MG tablet Commonly known as: VIBRA -TABS Stopped by: Napolean Backbone   predniSONE  20 MG tablet Commonly known as: DELTASONE  Stopped by: Napolean Backbone        TAKE these medications    acetaminophen  500 MG tablet Commonly known as: TYLENOL  Take 2 tablets (1,000 mg total) by mouth every 8 (eight) hours as needed.   albuterol  108 (90 Base) MCG/ACT inhaler Commonly known as: VENTOLIN  HFA Inhale 2 puffs into the lungs every 6 (six) hours as needed for wheezing or shortness of breath.   amLODipine  5 MG tablet Commonly known as: NORVASC  Take 1 tablet (5 mg total) by mouth daily.   atorvastatin  20 MG tablet Commonly known as: LIPITOR Take 1 tablet (20 mg total) by mouth daily.   budesonide  3 MG 24 hr capsule Commonly known as: ENTOCORT EC  Take 6 mg by mouth at bedtime.   busPIRone  5 MG tablet Commonly known as: BUSPAR  Take 1 tablet (5 mg total) by mouth 2 (  two) times daily.   calcium -vitamin D 500-200 MG-UNIT tablet Commonly known as: OSCAL WITH D Take 1 tablet by mouth daily with breakfast.   colestipol 1 g tablet Commonly known as: COLESTID Take 1 g by mouth at bedtime.   Melatonin 10 MG Tabs Take 20 mg by mouth at bedtime as needed (sleep).   meloxicam  15 MG tablet Commonly known as: MOBIC  Take 1 tablet (15 mg total) by mouth daily as needed for pain. Started by: Napolean Backbone   montelukast  10 MG tablet Commonly known as: SINGULAIR  Take 1 tablet (10 mg total) by mouth at bedtime.   sertraline  50 MG tablet Commonly known as: ZOLOFT  Take 1 tablet (50 mg total) by mouth at bedtime.   tiZANidine  4 MG tablet Commonly known as: Zanaflex  Take 0.5-1 tablets (2-4 mg total) by mouth every 8 (eight) hours as needed for muscle spasms.   Trelegy Ellipta  200-62.5-25 MCG/ACT Aepb Generic drug: Fluticasone -Umeclidin-Vilant Inhale 1 Inhalation into the lungs daily.   Vitamin D3 25 MCG (1000 UT) Chew Chew 1 tablet by mouth daily.   Xyzal  Allergy  24HR 5 MG tablet Generic drug: levocetirizine Take 1 tablet (5 mg total) by mouth every evening. What changed: Another medication with the same name was added. Make sure you understand  how and when to take each. Changed by: Napolean Backbone   levocetirizine 5 MG tablet Commonly known as: XYZAL  Take 1 tablet (5 mg total) by mouth every evening. What changed: You were already taking a medication with the same name, and this prescription was added. Make sure you understand how and when to take each. Changed by: Napolean Backbone        All past medical history, surgical history, allergies, family history, immunizations andmedications were updated in the EMR today and reviewed under the history and medication portions of their EMR.       ROS 14 pt review of systems performed and negative (unless mentioned in an HPI)  Objective: BP 126/74   Pulse 83   Temp 98.5 F (36.9 C)   Wt 140 lb (63.5 kg)   SpO2 97%   BMI 27.34 kg/m  Physical Exam Vitals and nursing note reviewed.  Constitutional:      General: She is not in acute distress.    Appearance: Normal appearance. She is not ill-appearing, toxic-appearing or diaphoretic.  HENT:     Head: Normocephalic and atraumatic.  Eyes:     General: No scleral icterus.       Right eye: No discharge.        Left eye: No discharge.     Extraocular Movements: Extraocular movements intact.     Conjunctiva/sclera: Conjunctivae normal.     Pupils: Pupils are equal, round, and reactive to light.  Cardiovascular:     Rate and Rhythm: Normal rate and regular rhythm.     Heart sounds: No murmur heard. Pulmonary:     Effort: Pulmonary effort is normal. No respiratory distress.     Breath sounds: Normal breath sounds. No wheezing, rhonchi or rales.  Musculoskeletal:     Cervical back: Neck supple.     Right lower leg: No edema.     Left lower leg: No edema.  Skin:    General: Skin is warm.     Findings: No rash.  Neurological:     Mental Status: She is alert and oriented to person, place, and time. Mental status is at baseline.     Motor: No weakness.     Gait:  Gait normal.  Psychiatric:        Mood and Affect: Mood normal.         Behavior: Behavior normal.        Thought Content: Thought content normal.        Judgment: Judgment normal.      Assessment/plan: Ana Johnston is a 78 y.o. female present for chronic condition management Hypertension/hyperlipidemia/CAD/family history of heart disease: Stable Continue amlodipine  5 mg daily Continue HCTZ 12.5 mg as needed for edema.  Continue Lipitor 20 mg daily   Anxiety: Stable Continue BuSpar  5 mg twice daily  Continue Zoloft  50 mg daily.    COPD/recent pneumothorax-traumatic: Discussed referral to pulmonology secondary to COPD, recent pneumothorax along with reports of having more difficulty getting back to her breathing at baseline.  Patient is agreeable. Continue Trelegy daily. Continue Singulair . Continue Xyzal . Continue albuterol  as needed.  lumbar spinal stenosis: Patient reports she has a history of spinal stenosis.  She has received injections from a physician in [place] in the past.   Continue meloxicam  15 mg daily.  Lymphocytic colitis: She is under the care of gastroenterology for this condition.  Dr. Algie Antis.  Return in about 3 months (around 01/26/2024) for cpe (20 min), Routine chronic condition follow-up.  Orders Placed This Encounter  Procedures   Ambulatory referral to Pulmonology   Meds ordered this encounter  Medications   amLODipine  (NORVASC ) 5 MG tablet    Sig: Take 1 tablet (5 mg total) by mouth daily.    Dispense:  90 tablet    Refill:  1   montelukast  (SINGULAIR ) 10 MG tablet    Sig: Take 1 tablet (10 mg total) by mouth at bedtime.    Dispense:  90 tablet    Refill:  1   sertraline  (ZOLOFT ) 50 MG tablet    Sig: Take 1 tablet (50 mg total) by mouth at bedtime.    Dispense:  90 tablet    Refill:  1   busPIRone  (BUSPAR ) 5 MG tablet    Sig: Take 1 tablet (5 mg total) by mouth 2 (two) times daily.    Dispense:  180 tablet    Refill:  1   albuterol  (VENTOLIN  HFA) 108 (90 Base) MCG/ACT inhaler    Sig: Inhale 2  puffs into the lungs every 6 (six) hours as needed for wheezing or shortness of breath.    Dispense:  18 g    Refill:  11   tiZANidine  (ZANAFLEX ) 4 MG tablet    Sig: Take 0.5-1 tablets (2-4 mg total) by mouth every 8 (eight) hours as needed for muscle spasms.    Dispense:  60 tablet    Refill:  2   TRELEGY ELLIPTA  200-62.5-25 MCG/ACT AEPB    Sig: Inhale 1 Inhalation into the lungs daily.    Dispense:  60 each    Refill:  11   meloxicam  (MOBIC ) 15 MG tablet    Sig: Take 1 tablet (15 mg total) by mouth daily as needed for pain.    Dispense:  90 tablet    Refill:  1   levocetirizine (XYZAL ) 5 MG tablet    Sig: Take 1 tablet (5 mg total) by mouth every evening.    Dispense:  90 tablet    Refill:  3   Referral Orders         Ambulatory referral to Pulmonology       Note is dictated utilizing voice recognition software. Although note has been proof read prior  to signing, occasional typographical errors still can be missed. If any questions arise, please do not hesitate to call for verification.  Electronically signed by: Napolean Backbone, DO [facility] [place]

## 2023-10-17 NOTE — Patient Instructions (Addendum)
 Schedule medicare wellness first week of August with nurse  Schedule your physical with me 01/26/2024 week- we will do labs this day.    Return in about 3 months (around 01/26/2024) for cpe (20 min), Routine chronic condition follow-up.        Great to see you today.  I have refilled the medication(s) we provide.   If labs were collected or images ordered, we will inform you of  results once we have received them and reviewed. We will contact you either by echart message, or telephone call.  Please give ample time to the testing facility, and our office to run,  receive and review results. Please do not call inquiring of results, even if you can see them in your chart. We will contact you as soon as we are able. If it has been over 1 week since the test was completed, and you have not yet heard from us , then please call us .    - echart message- for normal results that have been seen by the patient already.   - telephone call: abnormal results or if patient has not viewed results in their echart.  If a referral to a specialist was entered for you, please call us  in 2 weeks if you have not heard from the specialist office to schedule.

## 2023-10-20 DIAGNOSIS — M4802 Spinal stenosis, cervical region: Secondary | ICD-10-CM | POA: Diagnosis not present

## 2023-10-20 DIAGNOSIS — M5030 Other cervical disc degeneration, unspecified cervical region: Secondary | ICD-10-CM | POA: Diagnosis not present

## 2023-10-20 DIAGNOSIS — I119 Hypertensive heart disease without heart failure: Secondary | ICD-10-CM | POA: Diagnosis not present

## 2023-10-20 DIAGNOSIS — I2510 Atherosclerotic heart disease of native coronary artery without angina pectoris: Secondary | ICD-10-CM | POA: Diagnosis not present

## 2023-10-20 DIAGNOSIS — I700 Atherosclerosis of aorta: Secondary | ICD-10-CM | POA: Diagnosis not present

## 2023-10-20 DIAGNOSIS — Z9181 History of falling: Secondary | ICD-10-CM | POA: Diagnosis not present

## 2023-10-20 DIAGNOSIS — Z87891 Personal history of nicotine dependence: Secondary | ICD-10-CM | POA: Diagnosis not present

## 2023-10-20 DIAGNOSIS — S272XXD Traumatic hemopneumothorax, subsequent encounter: Secondary | ICD-10-CM | POA: Diagnosis not present

## 2023-10-20 DIAGNOSIS — F32A Depression, unspecified: Secondary | ICD-10-CM | POA: Diagnosis not present

## 2023-10-20 DIAGNOSIS — G8929 Other chronic pain: Secondary | ICD-10-CM | POA: Diagnosis not present

## 2023-10-20 DIAGNOSIS — S2241XD Multiple fractures of ribs, right side, subsequent encounter for fracture with routine healing: Secondary | ICD-10-CM | POA: Diagnosis not present

## 2023-10-20 DIAGNOSIS — T797XXD Traumatic subcutaneous emphysema, subsequent encounter: Secondary | ICD-10-CM | POA: Diagnosis not present

## 2023-10-20 DIAGNOSIS — Z7951 Long term (current) use of inhaled steroids: Secondary | ICD-10-CM | POA: Diagnosis not present

## 2023-10-20 DIAGNOSIS — M545 Low back pain, unspecified: Secondary | ICD-10-CM | POA: Diagnosis not present

## 2023-10-20 DIAGNOSIS — M8589 Other specified disorders of bone density and structure, multiple sites: Secondary | ICD-10-CM | POA: Diagnosis not present

## 2023-10-20 DIAGNOSIS — J449 Chronic obstructive pulmonary disease, unspecified: Secondary | ICD-10-CM | POA: Diagnosis not present

## 2023-10-20 DIAGNOSIS — M150 Primary generalized (osteo)arthritis: Secondary | ICD-10-CM | POA: Diagnosis not present

## 2023-10-20 DIAGNOSIS — E785 Hyperlipidemia, unspecified: Secondary | ICD-10-CM | POA: Diagnosis not present

## 2023-10-20 DIAGNOSIS — Z86711 Personal history of pulmonary embolism: Secondary | ICD-10-CM | POA: Diagnosis not present

## 2023-10-20 DIAGNOSIS — F411 Generalized anxiety disorder: Secondary | ICD-10-CM | POA: Diagnosis not present

## 2023-10-22 DIAGNOSIS — K52832 Lymphocytic colitis: Secondary | ICD-10-CM | POA: Diagnosis not present

## 2023-10-22 DIAGNOSIS — K219 Gastro-esophageal reflux disease without esophagitis: Secondary | ICD-10-CM | POA: Diagnosis not present

## 2023-10-22 DIAGNOSIS — Z860100 Personal history of colon polyps, unspecified: Secondary | ICD-10-CM | POA: Diagnosis not present

## 2023-10-30 DIAGNOSIS — I2510 Atherosclerotic heart disease of native coronary artery without angina pectoris: Secondary | ICD-10-CM | POA: Diagnosis not present

## 2023-10-30 DIAGNOSIS — Z7951 Long term (current) use of inhaled steroids: Secondary | ICD-10-CM | POA: Diagnosis not present

## 2023-10-30 DIAGNOSIS — G8929 Other chronic pain: Secondary | ICD-10-CM | POA: Diagnosis not present

## 2023-10-30 DIAGNOSIS — M545 Low back pain, unspecified: Secondary | ICD-10-CM | POA: Diagnosis not present

## 2023-10-30 DIAGNOSIS — F32A Depression, unspecified: Secondary | ICD-10-CM | POA: Diagnosis not present

## 2023-10-30 DIAGNOSIS — J449 Chronic obstructive pulmonary disease, unspecified: Secondary | ICD-10-CM | POA: Diagnosis not present

## 2023-10-30 DIAGNOSIS — S2241XD Multiple fractures of ribs, right side, subsequent encounter for fracture with routine healing: Secondary | ICD-10-CM | POA: Diagnosis not present

## 2023-10-30 DIAGNOSIS — M8589 Other specified disorders of bone density and structure, multiple sites: Secondary | ICD-10-CM | POA: Diagnosis not present

## 2023-10-30 DIAGNOSIS — Z9181 History of falling: Secondary | ICD-10-CM | POA: Diagnosis not present

## 2023-10-30 DIAGNOSIS — M4802 Spinal stenosis, cervical region: Secondary | ICD-10-CM | POA: Diagnosis not present

## 2023-10-30 DIAGNOSIS — Z87891 Personal history of nicotine dependence: Secondary | ICD-10-CM | POA: Diagnosis not present

## 2023-10-30 DIAGNOSIS — Z86711 Personal history of pulmonary embolism: Secondary | ICD-10-CM | POA: Diagnosis not present

## 2023-10-30 DIAGNOSIS — F411 Generalized anxiety disorder: Secondary | ICD-10-CM | POA: Diagnosis not present

## 2023-10-30 DIAGNOSIS — M150 Primary generalized (osteo)arthritis: Secondary | ICD-10-CM | POA: Diagnosis not present

## 2023-10-30 DIAGNOSIS — I119 Hypertensive heart disease without heart failure: Secondary | ICD-10-CM | POA: Diagnosis not present

## 2023-10-30 DIAGNOSIS — I700 Atherosclerosis of aorta: Secondary | ICD-10-CM | POA: Diagnosis not present

## 2023-10-30 DIAGNOSIS — E785 Hyperlipidemia, unspecified: Secondary | ICD-10-CM | POA: Diagnosis not present

## 2023-10-30 DIAGNOSIS — M5030 Other cervical disc degeneration, unspecified cervical region: Secondary | ICD-10-CM | POA: Diagnosis not present

## 2023-10-30 DIAGNOSIS — T797XXD Traumatic subcutaneous emphysema, subsequent encounter: Secondary | ICD-10-CM | POA: Diagnosis not present

## 2023-10-30 DIAGNOSIS — S272XXD Traumatic hemopneumothorax, subsequent encounter: Secondary | ICD-10-CM | POA: Diagnosis not present

## 2023-11-04 DIAGNOSIS — S272XXD Traumatic hemopneumothorax, subsequent encounter: Secondary | ICD-10-CM | POA: Diagnosis not present

## 2023-11-04 DIAGNOSIS — G8929 Other chronic pain: Secondary | ICD-10-CM | POA: Diagnosis not present

## 2023-11-04 DIAGNOSIS — F32A Depression, unspecified: Secondary | ICD-10-CM | POA: Diagnosis not present

## 2023-11-04 DIAGNOSIS — Z86711 Personal history of pulmonary embolism: Secondary | ICD-10-CM | POA: Diagnosis not present

## 2023-11-04 DIAGNOSIS — M5030 Other cervical disc degeneration, unspecified cervical region: Secondary | ICD-10-CM | POA: Diagnosis not present

## 2023-11-04 DIAGNOSIS — T797XXD Traumatic subcutaneous emphysema, subsequent encounter: Secondary | ICD-10-CM | POA: Diagnosis not present

## 2023-11-04 DIAGNOSIS — I2510 Atherosclerotic heart disease of native coronary artery without angina pectoris: Secondary | ICD-10-CM | POA: Diagnosis not present

## 2023-11-04 DIAGNOSIS — I119 Hypertensive heart disease without heart failure: Secondary | ICD-10-CM | POA: Diagnosis not present

## 2023-11-04 DIAGNOSIS — E785 Hyperlipidemia, unspecified: Secondary | ICD-10-CM | POA: Diagnosis not present

## 2023-11-04 DIAGNOSIS — Z7951 Long term (current) use of inhaled steroids: Secondary | ICD-10-CM | POA: Diagnosis not present

## 2023-11-04 DIAGNOSIS — M150 Primary generalized (osteo)arthritis: Secondary | ICD-10-CM | POA: Diagnosis not present

## 2023-11-04 DIAGNOSIS — M4802 Spinal stenosis, cervical region: Secondary | ICD-10-CM | POA: Diagnosis not present

## 2023-11-04 DIAGNOSIS — Z9181 History of falling: Secondary | ICD-10-CM | POA: Diagnosis not present

## 2023-11-04 DIAGNOSIS — S2241XD Multiple fractures of ribs, right side, subsequent encounter for fracture with routine healing: Secondary | ICD-10-CM | POA: Diagnosis not present

## 2023-11-04 DIAGNOSIS — I700 Atherosclerosis of aorta: Secondary | ICD-10-CM | POA: Diagnosis not present

## 2023-11-04 DIAGNOSIS — M8589 Other specified disorders of bone density and structure, multiple sites: Secondary | ICD-10-CM | POA: Diagnosis not present

## 2023-11-04 DIAGNOSIS — Z87891 Personal history of nicotine dependence: Secondary | ICD-10-CM | POA: Diagnosis not present

## 2023-11-04 DIAGNOSIS — M545 Low back pain, unspecified: Secondary | ICD-10-CM | POA: Diagnosis not present

## 2023-11-04 DIAGNOSIS — J449 Chronic obstructive pulmonary disease, unspecified: Secondary | ICD-10-CM | POA: Diagnosis not present

## 2023-11-04 DIAGNOSIS — F411 Generalized anxiety disorder: Secondary | ICD-10-CM | POA: Diagnosis not present

## 2023-11-08 ENCOUNTER — Other Ambulatory Visit: Payer: Self-pay | Admitting: Family Medicine

## 2023-11-11 DIAGNOSIS — M5416 Radiculopathy, lumbar region: Secondary | ICD-10-CM | POA: Diagnosis not present

## 2023-11-17 DIAGNOSIS — F411 Generalized anxiety disorder: Secondary | ICD-10-CM | POA: Diagnosis not present

## 2023-11-17 DIAGNOSIS — I2510 Atherosclerotic heart disease of native coronary artery without angina pectoris: Secondary | ICD-10-CM | POA: Diagnosis not present

## 2023-11-17 DIAGNOSIS — G8929 Other chronic pain: Secondary | ICD-10-CM | POA: Diagnosis not present

## 2023-11-17 DIAGNOSIS — F32A Depression, unspecified: Secondary | ICD-10-CM | POA: Diagnosis not present

## 2023-11-17 DIAGNOSIS — M150 Primary generalized (osteo)arthritis: Secondary | ICD-10-CM | POA: Diagnosis not present

## 2023-11-17 DIAGNOSIS — E785 Hyperlipidemia, unspecified: Secondary | ICD-10-CM | POA: Diagnosis not present

## 2023-11-17 DIAGNOSIS — S272XXD Traumatic hemopneumothorax, subsequent encounter: Secondary | ICD-10-CM | POA: Diagnosis not present

## 2023-11-17 DIAGNOSIS — M4802 Spinal stenosis, cervical region: Secondary | ICD-10-CM | POA: Diagnosis not present

## 2023-11-17 DIAGNOSIS — I700 Atherosclerosis of aorta: Secondary | ICD-10-CM | POA: Diagnosis not present

## 2023-11-17 DIAGNOSIS — T797XXD Traumatic subcutaneous emphysema, subsequent encounter: Secondary | ICD-10-CM | POA: Diagnosis not present

## 2023-11-17 DIAGNOSIS — J449 Chronic obstructive pulmonary disease, unspecified: Secondary | ICD-10-CM | POA: Diagnosis not present

## 2023-11-17 DIAGNOSIS — M5030 Other cervical disc degeneration, unspecified cervical region: Secondary | ICD-10-CM | POA: Diagnosis not present

## 2023-11-17 DIAGNOSIS — I119 Hypertensive heart disease without heart failure: Secondary | ICD-10-CM | POA: Diagnosis not present

## 2023-11-17 DIAGNOSIS — Z9181 History of falling: Secondary | ICD-10-CM | POA: Diagnosis not present

## 2023-11-17 DIAGNOSIS — Z86711 Personal history of pulmonary embolism: Secondary | ICD-10-CM | POA: Diagnosis not present

## 2023-11-17 DIAGNOSIS — M545 Low back pain, unspecified: Secondary | ICD-10-CM | POA: Diagnosis not present

## 2023-11-17 DIAGNOSIS — Z7951 Long term (current) use of inhaled steroids: Secondary | ICD-10-CM | POA: Diagnosis not present

## 2023-11-17 DIAGNOSIS — M8589 Other specified disorders of bone density and structure, multiple sites: Secondary | ICD-10-CM | POA: Diagnosis not present

## 2023-11-17 DIAGNOSIS — Z87891 Personal history of nicotine dependence: Secondary | ICD-10-CM | POA: Diagnosis not present

## 2023-11-17 DIAGNOSIS — S2241XD Multiple fractures of ribs, right side, subsequent encounter for fracture with routine healing: Secondary | ICD-10-CM | POA: Diagnosis not present

## 2023-11-20 ENCOUNTER — Other Ambulatory Visit: Payer: Self-pay | Admitting: Family Medicine

## 2023-11-23 DIAGNOSIS — B86 Scabies: Secondary | ICD-10-CM | POA: Diagnosis not present

## 2023-11-23 DIAGNOSIS — R21 Rash and other nonspecific skin eruption: Secondary | ICD-10-CM | POA: Diagnosis not present

## 2023-12-03 DIAGNOSIS — M4807 Spinal stenosis, lumbosacral region: Secondary | ICD-10-CM | POA: Diagnosis not present

## 2023-12-11 ENCOUNTER — Ambulatory Visit: Admitting: Family Medicine

## 2023-12-11 ENCOUNTER — Encounter: Payer: Self-pay | Admitting: Family Medicine

## 2023-12-11 VITALS — BP 131/78 | HR 61 | Temp 97.8°F | Ht 60.0 in | Wt 140.6 lb

## 2023-12-11 DIAGNOSIS — J3089 Other allergic rhinitis: Secondary | ICD-10-CM

## 2023-12-11 DIAGNOSIS — R051 Acute cough: Secondary | ICD-10-CM

## 2023-12-11 DIAGNOSIS — J019 Acute sinusitis, unspecified: Secondary | ICD-10-CM | POA: Diagnosis not present

## 2023-12-11 LAB — POC COVID19 BINAXNOW: SARS Coronavirus 2 Ag: NEGATIVE

## 2023-12-11 MED ORDER — AMOXICILLIN 875 MG PO TABS: 875.0000 mg | ORAL_TABLET | Freq: Two times a day (BID) | ORAL | 0 refills | Status: AC

## 2023-12-11 MED ORDER — PREDNISONE 20 MG PO TABS
ORAL_TABLET | ORAL | 0 refills | Status: DC
Start: 2023-12-11 — End: 2024-01-26

## 2023-12-11 NOTE — Progress Notes (Signed)
 OFFICE VISIT  12/11/2023  CC:  Chief Complaint  Patient presents with   Sinus Drainage    Pt also c/o sneezing, watery eyes, runny nose x 1 week; taken Sudafed, Flonase spray, Zyrtec. Denies n/v/d    Patient is a 78 y.o. female who presents for sinus concerns.  HPI: Describes chronic rhinitis, worse in the last 7 to 10 days.  Diffuse maxillary and frontal pain lately.  Only occasional postnasal drip with coughing.  No fever or sore throat. No known sick contacts. She is taking daily antihistamine and steroid nasal spray as well as oral decongestant.  Past Medical History:  Diagnosis Date   Acute pulmonary embolism without acute cor pulmonale (HCC) 05/17/2022   Formatting of this note might be different from the original. CTA of the Chest (05/13/2022): Small subsegmental right lower lobe pulmonary embolus, likely subacute in age given its relatively peripheral location within the vessel.     Arthritis    Atypical chest pain 04/09/2022   Atypical chest pain     Clostridium difficile diarrhea 05/25/2022   Colitis    Depression    Diverticulitis    Dyspnea on exertion 04/09/2022   Dyspnea on exertion     History of Clostridioides difficile colitis 09/18/2023   Hypertension    Seasonal allergies     Past Surgical History:  Procedure Laterality Date   ABDOMINAL HYSTERECTOMY     BACK SURGERY  09/2016   CATARACT EXTRACTION, BILATERAL     CHOLECYSTECTOMY     1966   COLONOSCOPY  09/2022   TONSILLECTOMY AND ADENOIDECTOMY     1965    Outpatient Medications Prior to Visit  Medication Sig Dispense Refill   acetaminophen  (TYLENOL ) 500 MG tablet Take 2 tablets (1,000 mg total) by mouth every 8 (eight) hours as needed.     albuterol  (VENTOLIN  HFA) 108 (90 Base) MCG/ACT inhaler Inhale 2 puffs into the lungs every 6 (six) hours as needed for wheezing or shortness of breath. 18 g 11   amLODipine  (NORVASC ) 5 MG tablet Take 1 tablet (5 mg total) by mouth daily. 90 tablet 1    atorvastatin  (LIPITOR) 20 MG tablet Take 1 tablet (20 mg total) by mouth daily.     budesonide  (ENTOCORT EC ) 3 MG 24 hr capsule Take 6 mg by mouth at bedtime.     busPIRone  (BUSPAR ) 5 MG tablet Take 1 tablet (5 mg total) by mouth 2 (two) times daily. 180 tablet 1   calcium -vitamin D (OSCAL WITH D) 500-200 MG-UNIT tablet Take 1 tablet by mouth daily with breakfast.     Cholecalciferol  (VITAMIN D3) 1000 units CHEW Chew 1 tablet by mouth daily.     colestipol (COLESTID) 1 g tablet Take 1 g by mouth at bedtime.     levocetirizine (XYZAL ) 5 MG tablet Take 1 tablet (5 mg total) by mouth every evening. 90 tablet 3   Melatonin 10 MG TABS Take 20 mg by mouth at bedtime as needed (sleep).     meloxicam  (MOBIC ) 15 MG tablet Take 1 tablet (15 mg total) by mouth daily as needed for pain. 90 tablet 1   montelukast  (SINGULAIR ) 10 MG tablet Take 1 tablet (10 mg total) by mouth at bedtime. 90 tablet 1   sertraline  (ZOLOFT ) 50 MG tablet Take 1 tablet (50 mg total) by mouth at bedtime. 90 tablet 1   tiZANidine  (ZANAFLEX ) 4 MG tablet Take 0.5-1 tablets (2-4 mg total) by mouth every 8 (eight) hours as needed for muscle spasms. 60  tablet 2   TRELEGY ELLIPTA  200-62.5-25 MCG/ACT AEPB Inhale 1 Inhalation into the lungs daily. 60 each 11   XYZAL  ALLERGY  24HR 5 MG tablet Take 1 tablet (5 mg total) by mouth every evening. 90 tablet 3   No facility-administered medications prior to visit.    No Known Allergies  Review of Systems  As per HPI  PE:    12/11/2023    1:47 PM 10/17/2023    1:31 PM 09/30/2023   11:38 AM  Vitals with BMI  Height 5' 0    Weight 140 lbs 10 oz 140 lbs 143 lbs  BMI 27.46  27.93  Systolic 131 126 877  Diastolic 78 74 70  Pulse 61 83 81     Physical Exam  VS: noted--normal. Gen: alert, NAD, NONTOXIC APPEARING. HEENT: eyes without injection, drainage, or swelling.  Ears: EACs clear, TMs with normal light reflex and landmarks.  Nose: Clear rhinorrhea, with some dried, crusty exudate  adherent to mildly injected mucosa.  No purulent d/c.  No paranasal sinus TTP.  No facial swelling.  Throat and mouth without focal lesion.  No pharyngial swelling, erythema, or exudate.   Neck: supple, no LAD.   LUNGS: CTA bilat, nonlabored resps.   CV: RRR, no m/r/g. EXT: no c/c/e SKIN: no rash   LABS:  Last CBC Lab Results  Component Value Date   WBC 7.7 09/30/2023   HGB 12.7 09/30/2023   HCT 38.4 09/30/2023   MCV 95.2 09/30/2023   MCH 31.8 09/19/2023   RDW 13.5 09/30/2023   PLT 402.0 (H) 09/30/2023   Last metabolic panel Lab Results  Component Value Date   GLUCOSE 87 09/30/2023   NA 138 09/30/2023   K 4.3 09/30/2023   CL 104 09/30/2023   CO2 27 09/30/2023   BUN 11 09/30/2023   CREATININE 0.68 09/30/2023   GFR 83.58 09/30/2023   CALCIUM  8.9 09/30/2023   PHOS 2.7 09/18/2023   PROT 7.1 09/18/2023   ALBUMIN 3.7 09/18/2023   BILITOT 0.7 09/18/2023   ALKPHOS 82 09/18/2023   AST 36 09/18/2023   ALT 30 09/18/2023   ANIONGAP 9 09/21/2023   IMPRESSION AND PLAN:  Acute allergic and infectious rhino-sinusitis. Prednisone  40 mg a day x 5 days. Amoxicillin  875 twice daily x 10 days. Zaditor eyedrops over-the-counter recommended.  She can continue daily nonsedating antihistamine and steroid nasal spray.  Covid test NEG here today.  An After Visit Summary was printed and given to the patient.  FOLLOW UP: Return if symptoms worsen or fail to improve.  Signed:  [name], MD           12/11/2023

## 2023-12-11 NOTE — Patient Instructions (Signed)
 Buy over the counter allergy  eye drops called zaditor (generic is fine). Take as instructed on packaging.

## 2023-12-19 ENCOUNTER — Other Ambulatory Visit: Payer: Self-pay | Admitting: Family Medicine

## 2024-01-07 ENCOUNTER — Ambulatory Visit

## 2024-01-26 ENCOUNTER — Ambulatory Visit (INDEPENDENT_AMBULATORY_CARE_PROVIDER_SITE_OTHER): Admitting: Family Medicine

## 2024-01-26 ENCOUNTER — Encounter: Payer: Self-pay | Admitting: Family Medicine

## 2024-01-26 VITALS — BP 128/82 | HR 72 | Temp 98.2°F | Wt 142.6 lb

## 2024-01-26 DIAGNOSIS — Z0000 Encounter for general adult medical examination without abnormal findings: Secondary | ICD-10-CM

## 2024-01-26 DIAGNOSIS — J439 Emphysema, unspecified: Secondary | ICD-10-CM | POA: Diagnosis not present

## 2024-01-26 DIAGNOSIS — M8589 Other specified disorders of bone density and structure, multiple sites: Secondary | ICD-10-CM

## 2024-01-26 DIAGNOSIS — M4802 Spinal stenosis, cervical region: Secondary | ICD-10-CM

## 2024-01-26 DIAGNOSIS — M150 Primary generalized (osteo)arthritis: Secondary | ICD-10-CM | POA: Diagnosis not present

## 2024-01-26 DIAGNOSIS — E663 Overweight: Secondary | ICD-10-CM

## 2024-01-26 DIAGNOSIS — I2510 Atherosclerotic heart disease of native coronary artery without angina pectoris: Secondary | ICD-10-CM

## 2024-01-26 DIAGNOSIS — Z131 Encounter for screening for diabetes mellitus: Secondary | ICD-10-CM | POA: Diagnosis not present

## 2024-01-26 DIAGNOSIS — E519 Thiamine deficiency, unspecified: Secondary | ICD-10-CM | POA: Diagnosis not present

## 2024-01-26 DIAGNOSIS — E782 Mixed hyperlipidemia: Secondary | ICD-10-CM

## 2024-01-26 DIAGNOSIS — F411 Generalized anxiety disorder: Secondary | ICD-10-CM

## 2024-01-26 DIAGNOSIS — I10 Essential (primary) hypertension: Secondary | ICD-10-CM | POA: Diagnosis not present

## 2024-01-26 DIAGNOSIS — Z1231 Encounter for screening mammogram for malignant neoplasm of breast: Secondary | ICD-10-CM

## 2024-01-26 LAB — LIPID PANEL
Cholesterol: 155 mg/dL (ref 0–200)
HDL: 93.8 mg/dL (ref >39.00)
LDL Cholesterol: 45 mg/dL (ref 0–99)
NonHDL: 61.49
Total CHOL/HDL Ratio: 2
Triglycerides: 82 mg/dL (ref 0.0–149.0)
VLDL: 16.4 mg/dL (ref 0.0–40.0)

## 2024-01-26 LAB — CBC
HCT: 40.8 % (ref 36.0–46.0)
Hemoglobin: 13.5 g/dL (ref 12.0–15.0)
MCHC: 33 g/dL (ref 30.0–36.0)
MCV: 94.1 fl (ref 78.0–100.0)
Platelets: 266 K/uL (ref 150.0–400.0)
RBC: 4.34 Mil/uL (ref 3.87–5.11)
RDW: 14 % (ref 11.5–15.5)
WBC: 7.9 K/uL (ref 4.0–10.5)

## 2024-01-26 LAB — COMPREHENSIVE METABOLIC PANEL WITH GFR
ALT: 23 U/L (ref 0–35)
AST: 21 U/L (ref 0–37)
Albumin: 4.1 g/dL (ref 3.5–5.2)
Alkaline Phosphatase: 94 U/L (ref 39–117)
BUN: 20 mg/dL (ref 6–23)
CO2: 28 meq/L (ref 19–32)
Calcium: 9.1 mg/dL (ref 8.4–10.5)
Chloride: 102 meq/L (ref 96–112)
Creatinine, Ser: 0.67 mg/dL (ref 0.40–1.20)
GFR: 83.69 mL/min (ref >60.00)
Glucose, Bld: 101 mg/dL — ABNORMAL HIGH (ref 70–99)
Potassium: 3.9 meq/L (ref 3.5–5.1)
Sodium: 141 meq/L (ref 135–145)
Total Bilirubin: 0.4 mg/dL (ref 0.2–1.2)
Total Protein: 6.4 g/dL (ref 6.0–8.3)

## 2024-01-26 LAB — VITAMIN D 25 HYDROXY (VIT D DEFICIENCY, FRACTURES): VITD: 33.58 ng/mL (ref 30.00–100.00)

## 2024-01-26 LAB — TSH: TSH: 2.19 u[IU]/mL (ref 0.35–5.50)

## 2024-01-26 LAB — HEMOGLOBIN A1C: Hgb A1c MFr Bld: 6.4 % (ref 4.6–6.5)

## 2024-01-26 MED ORDER — SERTRALINE HCL 50 MG PO TABS: 50.0000 mg | ORAL_TABLET | Freq: Every day | ORAL | 1 refills | Status: AC

## 2024-01-26 MED ORDER — MELOXICAM 15 MG PO TABS: 15.0000 mg | ORAL_TABLET | Freq: Every day | ORAL | 1 refills | Status: AC | PRN

## 2024-01-26 MED ORDER — BUSPIRONE HCL 5 MG PO TABS: 5.0000 mg | ORAL_TABLET | Freq: Two times a day (BID) | ORAL | 1 refills | Status: AC

## 2024-01-26 MED ORDER — MONTELUKAST SODIUM 10 MG PO TABS: 10.0000 mg | ORAL_TABLET | Freq: Every day | ORAL | 1 refills | Status: DC

## 2024-01-26 MED ORDER — AMLODIPINE BESYLATE 5 MG PO TABS: 5.0000 mg | ORAL_TABLET | Freq: Every day | ORAL | 1 refills | Status: AC

## 2024-01-26 MED ORDER — ATORVASTATIN CALCIUM 20 MG PO TABS: 20.0000 mg | ORAL_TABLET | Freq: Every evening | ORAL | 3 refills | Status: DC | PRN

## 2024-01-26 NOTE — Progress Notes (Signed)
 Patient ID: Ana Johnston, female  DOB: [birth year], 78 y.o.   MRN: [id number] Patient Care Team    Relationship Specialty Notifications Start End  Catherine Charlies LABOR, DO PCP - General Family Medicine  04/14/23   Lucio Elsie BROCKS, MD Referring Physician Gastroenterology  05/13/23   Court Dorn PARAS, MD Consulting Physician Cardiology  05/16/23   Tat, Asberry RAMAN, DO Consulting Physician Neurology  09/18/23     Chief Complaint  Patient presents with   Annual Exam    Chronic Conditions/illness Management Pt is fasting.     Subjective:  [name] is a 78 y.o.  female present for CPE and chronic condition management All past medical history, surgical history, allergies, family history, immunizations, medications and social history were updated in the electronic medical record today. All recent labs, ED visits and hospitalizations within the last year were reviewed.  Health maintenance:  Mammogram:pt declined Immunizations: Tdap UTD 02/2017, influenza encouraged yearly, pneumonia series completed, Shingrix series completed Infectious disease screening: Hep C-declined DEXA: Osteopenia 2021- records- pt declined repeat  Hypertension/hyperlipidemia/CAD/family history of heart disease: Pt reports compliance with amlodipine  5 mg daily and HCTZ 12.5 mg as needed.  Patient denies chest pain, shortness of breath, dizziness or lower extremity edema.  t prescribed Lipitor 20 mg daily  Anxiety: Patient reports compliance with BuSpar  5 mg twice daily and Zoloft  50 mg daily.  She feels these medications work well for her.  COPD/recent traumatic pneumothorax: Patient is not currently established with pulmonology.  She recently was started on Trelegy, in place of Symbicort , and although it is more costly for her she feels it works better for her.  She uses albuterol  as needed.  She takes nightly Xyzal  and Singulair .  Recent pneumothorax secondary to rib fracture after fall,  recovering well.  Chest x-ray repeat reassuring. She does feel like she is having a harder time getting back to her normal from a volume standpoint since injury/hospitalization.  Cervical spinal stenosis: Patient reports she has a history of spinal stenosis.  She has received injections from a physician in [place] in the past.  She currently is prescribed meloxicam  15 mg daily. MRI spina cervical 06/12/2017 IMPRESSION:  1. No acute fracture or evidence for ligamentous injury. No abnormal  cord signal.  2. Mild edema within and surrounding right C3-4 facet, likely  degenerative.  3. Small effusions within the anterior C1-2 and bilateral atlanto  occipital joints without bone marrow edema, likely degenerative.  4. Cervical spondylosis greatest at the C5-6 level.  5. Multilevel mild foraminal stenosis with moderate right C3-4 and  moderate left C5-6 foraminal stenosis.  6. No significant canal stenosis.   Lymphocytic colitis: She is under the care of gastroenterology for this condition.  Dr. Lucio.     05/16/2023    8:26 AM  Depression screen PHQ 2/9  Decreased Interest 0  Down, Depressed, Hopeless 0  PHQ - 2 Score 0       No data to display                   05/16/2023    8:26 AM 07/04/2017    9:48 AM  Fall Risk   Falls in the past year? 1 Yes   Number falls in past yr: 0 2 or more   Injury with Fall? 1 Yes   Risk Factor Category   High Fall Risk   Risk for fall due to : History of fall(s)   Follow up  Falls evaluation completed Falls evaluation completed      Data saved with a previous flowsheet row definition     Immunization History  Administered Date(s) Administered   DT (Pediatric) 06/03/2004   Fluad Quad(high Dose 65+) 03/28/2021, 04/11/2022   INFLUENZA, HIGH DOSE SEASONAL PF 02/26/2013, 03/31/2015, 04/03/2017   Influenza Inj Mdck Quad Pf 04/01/2012   Influenza, Seasonal, Injecte, Preservative Fre 04/01/2012   Influenza-Unspecified 04/17/2023   Moderna  Sars-Covid-2 Vaccination 10/14/2019, 11/11/2019   PNEUMOCOCCAL CONJUGATE-20 04/17/2023   Pneumococcal Conjugate-13 03/31/2015   Pneumococcal Polysaccharide-23 02/19/2005, 03/01/2011, 08/18/2017   Tdap 04/20/2015, 02/01/2017   Unspecified SARS-COV-2 Vaccination 04/17/2023   Zoster Recombinant(Shingrix) 12/27/2017, 04/14/2018   Zoster, Live 06/04/2007    No results found.  Past Medical History:  Diagnosis Date   Acute pulmonary embolism without acute cor pulmonale (HCC) 05/17/2022   Formatting of this note might be different from the original. CTA of the Chest (05/13/2022): Small subsegmental right lower lobe pulmonary embolus, likely subacute in age given its relatively peripheral location within the vessel.     Arthritis    Atypical chest pain 04/09/2022   Atypical chest pain     Clostridium difficile diarrhea 05/25/2022   Colitis    Depression    Diverticulitis    Dyspnea on exertion 04/09/2022   Dyspnea on exertion     History of Clostridioides difficile colitis 09/18/2023   Hypertension    Seasonal allergies    Subcutaneous emphysema due to trauma (HCC) 09/18/2023   Traumatic fracture of ribs 7-10 of right side with pneumothorax 10/29/2022   Onset Date: [id number]     Traumatic fracture of ribs with pneumothorax 09/18/2023   No Known Allergies Past Surgical History:  Procedure Laterality Date   ABDOMINAL HYSTERECTOMY     BACK SURGERY  09/2016   CATARACT EXTRACTION, BILATERAL     CHOLECYSTECTOMY     1966   COLONOSCOPY  09/2022   TONSILLECTOMY AND ADENOIDECTOMY     1965   Family History  Problem Relation Age of Onset   Diabetes Mother    Heart attack Father    Hearing loss Sister    Breast cancer Sister    Hyperlipidemia Brother    Heart attack Brother    Hearing loss Brother    Hypertension Son    Hyperlipidemia Son    Alcohol abuse Son    Social History   Social History Narrative   Marital status/children/pets:  widowed, G2P2   Education/employment: 12th  grade education, retired   Field seismologist:      -smoke alarm in the home:Yes     - wears seatbelt: Yes     - Feels safe in their relationships: Yes       Allergies as of 01/26/2024   No Known Allergies      Medication List        Accurate as of January 26, 2024 10:14 AM. If you have any questions, ask your nurse or doctor.          STOP taking these medications    predniSONE  20 MG tablet Commonly known as: DELTASONE  Stopped by: Charlies Bellini       TAKE these medications    acetaminophen  500 MG tablet Commonly known as: TYLENOL  Take 2 tablets (1,000 mg total) by mouth every 8 (eight) hours as needed.   albuterol  108 (90 Base) MCG/ACT inhaler Commonly known as: VENTOLIN  HFA Inhale 2 puffs into the lungs every 6 (six) hours as needed for wheezing or shortness of breath.  amLODipine  5 MG tablet Commonly known as: NORVASC  Take 1 tablet (5 mg total) by mouth daily.   atorvastatin  20 MG tablet Commonly known as: LIPITOR Take 1 tablet (20 mg total) by mouth at bedtime as needed. What changed:  when to take this reasons to take this Changed by: Charlies Bellini   budesonide  3 MG 24 hr capsule Commonly known as: ENTOCORT EC  Take 6 mg by mouth at bedtime.   busPIRone  5 MG tablet Commonly known as: BUSPAR  Take 1 tablet (5 mg total) by mouth 2 (two) times daily.   calcium -vitamin D  500-200 MG-UNIT tablet Commonly known as: OSCAL WITH D Take 1 tablet by mouth daily with breakfast.   colestipol 1 g tablet Commonly known as: COLESTID Take 1 g by mouth at bedtime.   hydrOXYzine 25 MG tablet Commonly known as: ATARAX Take 25 mg by mouth 4 (four) times daily.   levocetirizine 5 MG tablet Commonly known as: XYZAL  Take 1 tablet (5 mg total) by mouth every evening. What changed: Another medication with the same name was removed. Continue taking this medication, and follow the directions you see here. Changed by: Charlies Bellini   Melatonin 10 MG Tabs Take 20 mg by mouth at  bedtime as needed (sleep).   meloxicam  15 MG tablet Commonly known as: MOBIC  Take 1 tablet (15 mg total) by mouth daily as needed for pain.   montelukast  10 MG tablet Commonly known as: SINGULAIR  Take 1 tablet (10 mg total) by mouth daily.   sertraline  50 MG tablet Commonly known as: ZOLOFT  Take 1 tablet (50 mg total) by mouth at bedtime.   tiZANidine  4 MG tablet Commonly known as: Zanaflex  Take 0.5-1 tablets (2-4 mg total) by mouth every 8 (eight) hours as needed for muscle spasms.   Trelegy Ellipta  200-62.5-25 MCG/ACT Aepb Generic drug: Fluticasone -Umeclidin-Vilant Inhale 1 Inhalation into the lungs daily.   Vitamin D3 25 MCG (1000 UT) Chew Chew 1 tablet by mouth daily.        All past medical history, surgical history, allergies, family history, immunizations andmedications were updated in the EMR today and reviewed under the history and medication portions of their EMR.       ROS 14 pt review of systems performed and negative (unless mentioned in an HPI)  Objective: BP 128/82   Pulse 72   Temp 98.2 F (36.8 C)   Wt 142 lb 9.6 oz (64.7 kg)   SpO2 97%   BMI 27.85 kg/m  Physical Exam Vitals and nursing note reviewed.  Constitutional:      General: She is not in acute distress.    Appearance: Normal appearance. She is not ill-appearing, toxic-appearing or diaphoretic.  HENT:     Head: Normocephalic and atraumatic.     Right Ear: Tympanic membrane, ear canal and external ear normal. There is no impacted cerumen.     Left Ear: Tympanic membrane, ear canal and external ear normal. There is no impacted cerumen.     Nose: No congestion or rhinorrhea.     Mouth/Throat:     Mouth: Mucous membranes are moist.     Pharynx: Oropharynx is clear. No oropharyngeal exudate or posterior oropharyngeal erythema.  Eyes:     General: No scleral icterus.       Right eye: No discharge.        Left eye: No discharge.     Extraocular Movements: Extraocular movements intact.      Conjunctiva/sclera: Conjunctivae normal.     Pupils: Pupils are  equal, round, and reactive to light.  Cardiovascular:     Rate and Rhythm: Normal rate and regular rhythm.     Pulses: Normal pulses.     Heart sounds: Normal heart sounds. No murmur heard.    No friction rub. No gallop.  Pulmonary:     Effort: Pulmonary effort is normal. No respiratory distress.     Breath sounds: Normal breath sounds. No stridor. No wheezing, rhonchi or rales.  Chest:     Chest wall: No tenderness.  Abdominal:     General: Abdomen is flat. Bowel sounds are normal. There is no distension.     Palpations: Abdomen is soft. There is no mass.     Tenderness: There is no abdominal tenderness. There is no right CVA tenderness, left CVA tenderness, guarding or rebound.     Hernia: No hernia is present.  Musculoskeletal:        General: No swelling, tenderness or deformity. Normal range of motion.     Cervical back: Normal range of motion and neck supple. No rigidity or tenderness.     Right lower leg: No edema.     Left lower leg: No edema.  Lymphadenopathy:     Cervical: No cervical adenopathy.  Skin:    General: Skin is warm and dry.     Coloration: Skin is not jaundiced or pale.     Findings: No bruising, erythema, lesion or rash.  Neurological:     General: No focal deficit present.     Mental Status: She is alert and oriented to person, place, and time. Mental status is at baseline.     Cranial Nerves: No cranial nerve deficit.     Sensory: No sensory deficit.     Motor: No weakness.     Coordination: Coordination normal.     Gait: Gait normal.     Deep Tendon Reflexes: Reflexes normal.  Psychiatric:        Mood and Affect: Mood normal.        Behavior: Behavior normal.        Thought Content: Thought content normal.        Judgment: Judgment normal.      Assessment/plan: Lilliane Sposito is a 78 y.o. female present for CPE and chronic condition  management Hypertension/hyperlipidemia/CAD/family history of heart disease/E66.3: Stable Continue amlodipine  5 mg daily Continue HCTZ 12.5 mg as needed for edema.  Continue Lipitor 20 mg daily  Anxiety: Stable Continue BuSpar  5 mg twice daily  Continue Zoloft  50 mg daily.    COPD/recent pneumothorax-traumatic: Discussed referral to pulmonology secondary to COPD, recent pneumothorax along with reports of having more difficulty getting back to her breathing at baseline.  Established with pulmonology est sept. 2025 Continue Trelegy daily. Continue Singulair . Continue Xyzal . Continue albuterol  as needed.  lumbar spinal stenosis: Patient reports she has a history of spinal stenosis.  She has received injections from a physician in [place] in the past.   Continue meloxicam  15 mg daily.  Mixed hyperlipidemia - Hemoglobin A1c - Lipid panel Osteopenia of multiple sites (-1.01-2020) - Vitamin D  (25 hydroxy) - declined dexa Thiamine  deficiency - Vitamin B1 Diabetes mellitus screening/E66.3 - Hemoglobin A1c Breast cancer screening by mammogram Declined  Routine general medical examination at a health care facility (Primary) - CBC - Comprehensive metabolic panel with GFR - TSH Mammogram:pt declined Immunizations: Tdap UTD 02/2017, influenza encouraged yearly, pneumonia series completed, Shingrix series completed Infectious disease screening: Hep C-declined DEXA: Osteopenia 2021- records- pt declined repeat Patient  was encouraged to exercise greater than 150 minutes a week. Patient was encouraged to choose a diet filled with fresh fruits and vegetables, and lean meats. AVS provided to patient today for education/recommendation on gender specific health and safety maintenance.  Return in about 24 weeks (around 07/12/2024) for Routine chronic condition follow-up.  Orders Placed This Encounter  Procedures   CBC   Comprehensive metabolic panel with GFR   Hemoglobin A1c   Lipid panel    TSH   Vitamin D  (25 hydroxy)   Vitamin B1   Meds ordered this encounter  Medications   amLODipine  (NORVASC ) 5 MG tablet    Sig: Take 1 tablet (5 mg total) by mouth daily.    Dispense:  90 tablet    Refill:  1   atorvastatin  (LIPITOR) 20 MG tablet    Sig: Take 1 tablet (20 mg total) by mouth at bedtime as needed.    Dispense:  90 tablet    Refill:  3   busPIRone  (BUSPAR ) 5 MG tablet    Sig: Take 1 tablet (5 mg total) by mouth 2 (two) times daily.    Dispense:  180 tablet    Refill:  1   meloxicam  (MOBIC ) 15 MG tablet    Sig: Take 1 tablet (15 mg total) by mouth daily as needed for pain.    Dispense:  90 tablet    Refill:  1   montelukast  (SINGULAIR ) 10 MG tablet    Sig: Take 1 tablet (10 mg total) by mouth daily.    Dispense:  90 tablet    Refill:  1   sertraline  (ZOLOFT ) 50 MG tablet    Sig: Take 1 tablet (50 mg total) by mouth at bedtime.    Dispense:  90 tablet    Refill:  1   Referral Orders  No referral(s) requested today      Note is dictated utilizing voice recognition software. Although note has been proof read prior to signing, occasional typographical errors still can be missed. If any questions arise, please do not hesitate to call for verification.  Electronically signed by: Charlies Bellini, DO [facility]

## 2024-01-26 NOTE — Patient Instructions (Addendum)
 Return in about 24 weeks (around 07/12/2024) for Routine chronic condition follow-up.        Great to see you today.  I have refilled the medication(s) we provide.   If labs were collected or images ordered, we will inform you of  results once we have received them and reviewed. We will contact you either by echart message, or telephone call.  Please give ample time to the testing facility, and our office to run,  receive and review results. Please do not call inquiring of results, even if you can see them in your chart. We will contact you as soon as we are able. If it has been over 1 week since the test was completed, and you have not yet heard from us , then please call us .    - echart message- for normal results that have been seen by the patient already.   - telephone call: abnormal results or if patient has not viewed results in their echart.  If a referral to a specialist was entered for you, please call us  in 2 weeks if you have not heard from the specialist office to schedule.

## 2024-01-27 ENCOUNTER — Ambulatory Visit: Payer: Self-pay | Admitting: Family Medicine

## 2024-01-31 LAB — VITAMIN B1: Vitamin B1 (Thiamine): 21 nmol/L (ref 8–30)

## 2024-02-11 ENCOUNTER — Ambulatory Visit

## 2024-03-01 ENCOUNTER — Ambulatory Visit (HOSPITAL_BASED_OUTPATIENT_CLINIC_OR_DEPARTMENT_OTHER): Admitting: Pulmonary Disease

## 2024-03-01 ENCOUNTER — Encounter (HOSPITAL_BASED_OUTPATIENT_CLINIC_OR_DEPARTMENT_OTHER): Payer: Self-pay | Admitting: Pulmonary Disease

## 2024-03-01 VITALS — BP 142/69 | HR 88 | Ht 60.0 in | Wt 146.0 lb

## 2024-03-01 DIAGNOSIS — J432 Centrilobular emphysema: Secondary | ICD-10-CM

## 2024-03-01 NOTE — Progress Notes (Signed)
 Subjective:   PATIENT ID: Ana Johnston GENDER: female DOB: 1946-02-09, MRN: [id number]  Chief Complaint  Patient presents with   Establish Care    Consult COPD    Reason for Visit: New consult for shortness of breath    Ana Johnston is a 78 y.o. female former smoker with COPD, hx PE, trauma-related pneumothorax in 09/2023, HTN, HLD, osteopenia who presents as a new consult for shortness of breath with exertion.  She is on Trelegy once a day and uses albuterol  1-2 times a week for shortness of breath. Denies wheezing or coughing. Last exacerbation in the ED two years ago and was for RSV. She did have strep throat and covid last year. No limitation in activity. No nocturnal symptoms. We reviewed her history which revealed prior hospitalization for right pneumothorax after fall in April and CT demonstrated incidental findings of early emphysema. She has only been on maintenance inhalers for one year and felt this has helped. She was referred by primary care for further evaluation with pulmonary function tests.  Social History: She smoked 10 years with 0.75 ppd. Quit in 2021     03/01/2024 Discussed the use of AI scribe software for clinical note transcription with the patient, who gave verbal consent to proceed.  History of Present Illness      Past Medical History:  Diagnosis Date   Acute pulmonary embolism without acute cor pulmonale (HCC) 05/17/2022   Formatting of this note might be different from the original. CTA of the Chest (05/13/2022): Small subsegmental right lower lobe pulmonary embolus, likely subacute in age given its relatively peripheral location within the vessel.     Arthritis    Atypical chest pain 04/09/2022   Atypical chest pain     Clostridium difficile diarrhea 05/25/2022   Colitis    Depression    Diverticulitis    Dyspnea on exertion 04/09/2022   Dyspnea on exertion     History of Clostridioides difficile colitis 09/18/2023    Hypertension    Seasonal allergies    Subcutaneous emphysema due to trauma 09/18/2023   Traumatic fracture of ribs 7-10 of right side with pneumothorax 10/29/2022   Onset Date: [id number]     Traumatic fracture of ribs with pneumothorax 09/18/2023     Family History  Problem Relation Age of Onset   Diabetes Mother    Heart attack Father    Hearing loss Sister    Breast cancer Sister    Hyperlipidemia Brother    Heart attack Brother    Hearing loss Brother    Hypertension Son    Hyperlipidemia Son    Alcohol abuse Son      Social History   Occupational History   Occupation: retired  Tobacco Use   Smoking status: Former    Types: Cigarettes    Passive exposure: Never   Smokeless tobacco: Never   Tobacco comments:    1/2 ppd x 50+ years  Vaping Use   Vaping status: Never Used  Substance and Sexual Activity   Alcohol use: Yes    Comment: 2 glasses of wine a day   Drug use: Never   Sexual activity: Not Currently    Partners: Male    No Known Allergies   Outpatient Medications Prior to Visit  Medication Sig Dispense Refill   acetaminophen  (TYLENOL ) 500 MG tablet Take 2 tablets (1,000 mg total) by mouth every 8 (eight) hours as needed.     albuterol  (VENTOLIN  HFA) 108 (90 Base)  MCG/ACT inhaler Inhale 2 puffs into the lungs every 6 (six) hours as needed for wheezing or shortness of breath. 18 g 11   amLODipine  (NORVASC ) 5 MG tablet Take 1 tablet (5 mg total) by mouth daily. 90 tablet 1   atorvastatin  (LIPITOR) 20 MG tablet Take 1 tablet (20 mg total) by mouth at bedtime as needed. 90 tablet 3   budesonide  (ENTOCORT EC ) 3 MG 24 hr capsule Take 6 mg by mouth at bedtime.     busPIRone  (BUSPAR ) 5 MG tablet Take 1 tablet (5 mg total) by mouth 2 (two) times daily. 180 tablet 1   calcium -vitamin D  (OSCAL WITH D) 500-200 MG-UNIT tablet Take 1 tablet by mouth daily with breakfast.     Cholecalciferol  (VITAMIN D3) 1000 units CHEW Chew 1 tablet by mouth daily.     colestipol  (COLESTID) 1 g tablet Take 1 g by mouth at bedtime.     hydrOXYzine (ATARAX) 25 MG tablet Take 25 mg by mouth 4 (four) times daily.     levocetirizine (XYZAL ) 5 MG tablet Take 1 tablet (5 mg total) by mouth every evening. 90 tablet 3   Melatonin 10 MG TABS Take 20 mg by mouth at bedtime as needed (sleep).     meloxicam  (MOBIC ) 15 MG tablet Take 1 tablet (15 mg total) by mouth daily as needed for pain. 90 tablet 1   montelukast  (SINGULAIR ) 10 MG tablet Take 1 tablet (10 mg total) by mouth daily. 90 tablet 1   sertraline  (ZOLOFT ) 50 MG tablet Take 1 tablet (50 mg total) by mouth at bedtime. 90 tablet 1   tiZANidine  (ZANAFLEX ) 4 MG tablet Take 0.5-1 tablets (2-4 mg total) by mouth every 8 (eight) hours as needed for muscle spasms. 60 tablet 2   TRELEGY ELLIPTA  200-62.5-25 MCG/ACT AEPB Inhale 1 Inhalation into the lungs daily. 60 each 11   No facility-administered medications prior to visit.    Review of Systems  Constitutional:  Negative for chills, diaphoresis, fever, malaise/fatigue and weight loss.  HENT:  Negative for congestion.   Respiratory:  Negative for cough, hemoptysis, sputum production, shortness of breath and wheezing.   Cardiovascular:  Negative for chest pain, palpitations and leg swelling.     Objective:   Vitals:   03/01/24 1302  BP: (!) 142/69  Pulse: 88  SpO2: 95%  Weight: 146 lb (66.2 kg)  Height: 5' (1.524 m)   SpO2: 95 %  Physical Exam: General: Well-appearing, no acute distress HENT: [place], AT Eyes: EOMI, no scleral icterus Respiratory: Clear to auscultation bilaterally.  No crackles, wheezing or rales Cardiovascular: RRR, -M/R/G, no JVD Extremities:-Edema,-tenderness Neuro: AAO x4, CNII-XII grossly intact Psych: Normal mood, normal affect  Data Reviewed:  Imaging: CT CAP 09/18/23 - on visualized lung fields, subtle centrilobular emphysema, right pneumothorax associated with acute right fractures 7-10 CXR 09/30/23 - Resolved pneumothorax  PFT: None on  file  Labs: CBC    Component Value Date/Time   WBC 7.9 01/26/2024 0950   RBC 4.34 01/26/2024 0950   HGB 13.5 01/26/2024 0950   HCT 40.8 01/26/2024 0950   PLT 266.0 01/26/2024 0950   MCV 94.1 01/26/2024 0950   MCH 31.8 09/19/2023 0403   MCHC 33.0 01/26/2024 0950   RDW 14.0 01/26/2024 0950   LYMPHSABS 1.5 09/30/2023 1159   MONOABS 0.5 09/30/2023 1159   EOSABS 0.0 09/30/2023 1159   BASOSABS 0.1 09/30/2023 1159   Absolute eos 09/30/23 -0     Assessment & Plan:   Discussion: 78  y.o. female former smoker with COPD, hx PE, trauma-related pneumothorax in 09/2023, HTN, HLD, osteopenia who presents as a new consult for shortness of breath with exertion. Well controlled on triple therapy. No recent exacerbations in the last year. Will complete work-up with PFTs to determine severity. After PFTs, will consider annual follow-up   Assessment & Plan   Mild emphysema on CT Shortness of breath --CONTINUE Trelegy ONE puff ONCE a day --CONTINUE Albuterol  1-2 puffs every 4-6 hours AS NEEDED --ORDER pulmonary function tests  Health Maintenance Immunization History  Administered Date(s) Administered   DT (Pediatric) 06/03/2004   Fluad Quad(high Dose 65+) 03/28/2021, 04/11/2022   INFLUENZA, HIGH DOSE SEASONAL PF 02/26/2013, 03/31/2015, 04/03/2017   Influenza Inj Mdck Quad Pf 04/01/2012   Influenza, Seasonal, Injecte, Preservative Fre 04/01/2012   Influenza-Unspecified 04/17/2023   Moderna Sars-Covid-2 Vaccination 10/14/2019, 11/11/2019   PNEUMOCOCCAL CONJUGATE-20 04/17/2023   Pneumococcal Conjugate-13 03/31/2015   Pneumococcal Polysaccharide-23 02/19/2005, 03/01/2011, 08/18/2017   Tdap 04/20/2015, 02/01/2017   Unspecified SARS-COV-2 Vaccination 04/17/2023   Zoster Recombinant(Shingrix) 12/27/2017, 04/14/2018   Zoster, Live 06/04/2007   CT Lung Screen - not qualified due to inadequate smoking history  Orders Placed This Encounter  Procedures   Pulmonary function test    Where  should this test be performed?:   Outpatient Pulmonary    What type of PFT is being ordered?:   Full PFT  No orders of the defined types were placed in this encounter.   Return for after PFT.  I have spent a total time of 30-minutes on the day of the appointment reviewing prior documentation, coordinating care and discussing medical diagnosis and plan with the patient/family. Imaging, labs and tests included in this note have been reviewed and interpreted independently by me. This note is generated using [employer] programming. Patient/family has given consent.  Sury Wentworth Slater Staff, MD [facility] 03/01/2024 1:27 PM

## 2024-03-01 NOTE — Patient Instructions (Signed)
  Mild emphysema on CT Shortness of breath --CONTINUE Trelegy ONE puff ONCE a day --CONTINUE Albuterol  1-2 puffs every 4-6 hours AS NEEDED --ORDER pulmonary function tests

## 2024-03-02 ENCOUNTER — Ambulatory Visit (HOSPITAL_BASED_OUTPATIENT_CLINIC_OR_DEPARTMENT_OTHER)

## 2024-03-02 DIAGNOSIS — J432 Centrilobular emphysema: Secondary | ICD-10-CM | POA: Diagnosis not present

## 2024-03-02 LAB — PULMONARY FUNCTION TEST
DL/VA % pred: 67 %
DL/VA: 2.83 ml/min/mmHg/L
DLCO cor % pred: 65 %
DLCO cor: 10.81 ml/min/mmHg
DLCO unc % pred: 65 %
DLCO unc: 10.85 ml/min/mmHg
FEF 25-75 Post: 2.31 L/s
FEF 25-75 Pre: 2.25 L/s
FEF2575-%Change-Post: 2 %
FEF2575-%Pred-Post: 179 %
FEF2575-%Pred-Pre: 174 %
FEV1-%Change-Post: 2 %
FEV1-%Pred-Post: 140 %
FEV1-%Pred-Pre: 137 %
FEV1-Post: 2.3 L
FEV1-Pre: 2.25 L
FEV1FVC-%Change-Post: 0 %
FEV1FVC-%Pred-Pre: 109 %
FEV6-%Change-Post: 1 %
FEV6-%Pred-Post: 134 %
FEV6-%Pred-Pre: 132 %
FEV6-Post: 2.81 L
FEV6-Pre: 2.78 L
FEV6FVC-%Pred-Post: 106 %
FEV6FVC-%Pred-Pre: 106 %
FVC-%Change-Post: 2 %
FVC-%Pred-Post: 128 %
FVC-%Pred-Pre: 125 %
FVC-Post: 2.85 L
FVC-Pre: 2.78 L
Post FEV1/FVC ratio: 81 %
Post FEV6/FVC ratio: 100 %
Pre FEV1/FVC ratio: 81 %
Pre FEV6/FVC Ratio: 100 %
RV % pred: 105 %
RV: 2.27 L
TLC % pred: 114 %
TLC: 5.12 L

## 2024-03-02 NOTE — Progress Notes (Signed)
 Full PFT performed today.

## 2024-03-02 NOTE — Patient Instructions (Signed)
 Full PFT performed today.

## 2024-03-04 DIAGNOSIS — S62304A Unspecified fracture of fourth metacarpal bone, right hand, initial encounter for closed fracture: Secondary | ICD-10-CM | POA: Diagnosis not present

## 2024-03-05 DIAGNOSIS — S62332A Displaced fracture of neck of third metacarpal bone, right hand, initial encounter for closed fracture: Secondary | ICD-10-CM | POA: Diagnosis not present

## 2024-03-05 DIAGNOSIS — S62334A Displaced fracture of neck of fourth metacarpal bone, right hand, initial encounter for closed fracture: Secondary | ICD-10-CM | POA: Diagnosis not present

## 2024-03-05 DIAGNOSIS — M25641 Stiffness of right hand, not elsewhere classified: Secondary | ICD-10-CM | POA: Diagnosis not present

## 2024-03-15 ENCOUNTER — Other Ambulatory Visit: Payer: Self-pay | Admitting: Family Medicine

## 2024-03-25 ENCOUNTER — Ambulatory Visit: Payer: Self-pay | Admitting: Pulmonary Disease

## 2024-03-30 DIAGNOSIS — S62334D Displaced fracture of neck of fourth metacarpal bone, right hand, subsequent encounter for fracture with routine healing: Secondary | ICD-10-CM | POA: Diagnosis not present

## 2024-03-30 DIAGNOSIS — S62332D Displaced fracture of neck of third metacarpal bone, right hand, subsequent encounter for fracture with routine healing: Secondary | ICD-10-CM | POA: Diagnosis not present

## 2024-04-18 ENCOUNTER — Emergency Department (HOSPITAL_COMMUNITY)

## 2024-04-18 ENCOUNTER — Emergency Department (HOSPITAL_COMMUNITY)
Admission: EM | Admit: 2024-04-18 | Discharge: 2024-04-19 | Disposition: A | Discharge status: Home / Self Care | Attending: Emergency Medicine | Admitting: Emergency Medicine

## 2024-04-18 ENCOUNTER — Encounter (HOSPITAL_COMMUNITY): Payer: Self-pay

## 2024-04-18 DIAGNOSIS — W19XXXA Unspecified fall, initial encounter: Secondary | ICD-10-CM

## 2024-04-18 DIAGNOSIS — Y92019 Unspecified place in single-family (private) house as the place of occurrence of the external cause: Secondary | ICD-10-CM | POA: Diagnosis not present

## 2024-04-18 DIAGNOSIS — Y9301 Activity, walking, marching and hiking: Secondary | ICD-10-CM | POA: Insufficient documentation

## 2024-04-18 DIAGNOSIS — W01198A Fall on same level from slipping, tripping and stumbling with subsequent striking against other object, initial encounter: Secondary | ICD-10-CM | POA: Insufficient documentation

## 2024-04-18 DIAGNOSIS — F10929 Alcohol use, unspecified with intoxication, unspecified: Secondary | ICD-10-CM | POA: Diagnosis not present

## 2024-04-18 DIAGNOSIS — S0181XA Laceration without foreign body of other part of head, initial encounter: Secondary | ICD-10-CM | POA: Insufficient documentation

## 2024-04-18 DIAGNOSIS — S0990XA Unspecified injury of head, initial encounter: Secondary | ICD-10-CM | POA: Diagnosis not present

## 2024-04-18 DIAGNOSIS — S0993XA Unspecified injury of face, initial encounter: Secondary | ICD-10-CM | POA: Diagnosis not present

## 2024-04-18 MED ORDER — LIDOCAINE-EPINEPHRINE (PF) 2 %-1:200000 IJ SOLN
10.0000 mL | Freq: Once | INTRAMUSCULAR | Status: AC
  Administered 2024-04-18: 10 mL via INTRADERMAL
  Filled 2024-04-18: qty 20

## 2024-04-18 NOTE — ED Triage Notes (Signed)
 Pt coming from home ETOH, walking the dog, fell, hit R eyebrow w/large hematoma, went home fell inside the house hitting L side forehead approx 3in lac bleeding controlled at this time. Neg LOC neg THINNERS. A&Ox4

## 2024-04-18 NOTE — ED Provider Notes (Signed)
 [place] [facility] Provider Note   CSN: [id number] Arrival date & time: 04/18/24  2303     History Chief Complaint  Patient presents with   Fall    HPI Ana Johnston is a 78 y.o. female presenting for chief complaint ground-level fall.  States she was walking her dog.  Did have a few alcoholic beverages tonight.  Tripped over the leash, fell hit her forehead large laceration.  Was trying to get back in the house had a second fall. Denies fevers chills nausea vomiting syncope shortness of breath.  Does have a slight headache after the injury.  No anticoagulation.  Patient's recorded medical, surgical, social, medication list and allergies were reviewed in the Snapshot window as part of the initial history.   Review of Systems   Review of Systems  Constitutional:  Negative for chills and fever.  HENT:  Negative for ear pain and sore throat.   Eyes:  Negative for pain and visual disturbance.  Respiratory:  Negative for cough and shortness of breath.   Cardiovascular:  Negative for chest pain and palpitations.  Gastrointestinal:  Negative for abdominal pain and vomiting.  Genitourinary:  Negative for dysuria and hematuria.  Musculoskeletal:  Negative for arthralgias and back pain.  Skin:  Negative for color change and rash.  Neurological:  Negative for seizures and syncope.  All other systems reviewed and are negative.   Physical Exam Updated Vital Signs There were no vitals taken for this visit. Physical Exam Constitutional:      General: She is not in acute distress.    Appearance: She is not ill-appearing or toxic-appearing.  HENT:     Head: Normocephalic and atraumatic.  Eyes:     Extraocular Movements: Extraocular movements intact.     Pupils: Pupils are equal, round, and reactive to light.  Cardiovascular:     Rate and Rhythm: Normal rate.  Pulmonary:     Effort: No respiratory distress.  Abdominal:     General:  Abdomen is flat.  Musculoskeletal:        General: Signs of injury (8cm lac to forehead) present. No swelling or deformity.     Cervical back: Normal range of motion. No rigidity.  Skin:    General: Skin is warm and dry.  Neurological:     General: No focal deficit present.     Mental Status: She is alert and oriented to person, place, and time.  Psychiatric:        Mood and Affect: Mood normal.      ED Course/ Medical Decision Making/ A&P    Procedures .Laceration Repair  Date/Time: 04/19/2024 12:07 AM  Performed by: Jerral Meth, MD Authorized by: Jerral Meth, MD   Consent:    Consent obtained:  Verbal   Consent given by:  Patient   Risks, benefits, and alternatives were discussed: yes     Risks discussed:  Infection, pain, retained foreign body, tendon damage, poor cosmetic result, need for additional repair, nerve damage, poor wound healing and vascular damage   Alternatives discussed:  No treatment and delayed treatment Universal protocol:    Patient identity confirmed:  Verbally with patient and arm band Laceration details:    Location:  Face   Face location:  Forehead   Length (cm):  8 Pre-procedure details:    Preparation:  Patient was prepped and draped in usual sterile fashion Treatment:    Area cleansed with:  Saline   Amount of cleaning:  Extensive   Irrigation solution:  Sterile saline   Irrigation method:  Syringe Skin repair:    Repair method:  Sutures   Suture size:  4-0   Suture material:  Nylon   Suture technique:  Simple interrupted   Number of sutures:  4 Approximation:    Approximation:  Close Repair type:    Repair type:  Intermediate Post-procedure details:    Dressing:  Open (no dressing)   Procedure completion:  Tolerated    Medications Ordered in ED Medications  Tdap (ADACEL) injection 0.5 mL (has no administration in time range)  lidocaine -EPINEPHrine (XYLOCAINE  W/EPI) 2 %-1:200000 (PF) injection 10 mL (10 mLs  Intradermal Given 04/18/24 2355)    Medical Decision Making:   Ana Johnston is a 78 y.o. female who presented to the ED today with a fall at their home detailed above. They are not on a blood thinner. Patient placed on continuous vitals and telemetry monitoring while in ED which was reviewed periodically.   Complete initial physical exam performed, notably the patient  was hemodynamically stable  in no acute distress. No obvious deformities or injuries appreciated on extensive physical exam including active range of motion of all joints.     Reviewed and confirmed nursing documentation for past medical history, family history, social history.    Initial Assessment/Plan:   This is a patient presenting with a moderate blunt mechanism trauma.  As such, I have considered intracranial injuries including intracranial hemorrhage, intrathoracic injuries including blunt myocardial or blunt lung injury, blunt abdominal injuries including aortic dissection, bladder injury, spleen injury, liver injury and I have considered orthopedic injuries including extremity or spinal injury. This is most consistent with an acute life/limb threatening illness complicated by underlying chronic conditions.  With the patient's presentation of moderate mechanism trauma but an otherwise reassuring exam, patient warrants targeted evaluation for potential traumatic injuries. Will proceed with targeted evaluation for potential injuries. Will proceed with CT Head, Cervical Spine Ct. Images reviewed and agree with radiology interpretation.  CT HEAD WO CONTRAST ( ) Result Date: 04/19/2024 EXAM: CT HEAD WITHOUT CONTRAST 04/19/2024 12:01:06 AM TECHNIQUE: CT of the head was performed without the administration of intravenous contrast. Automated exposure control, iterative reconstruction, and/or weight based adjustment of the mA/kV was utilized to reduce the radiation dose to as low as reasonably achievable. COMPARISON: None  available. CLINICAL HISTORY: Head trauma, minor (Age >= 65y) FINDINGS: BRAIN AND VENTRICLES: No acute hemorrhage. No evidence of acute infarct. No hydrocephalus. No extra-axial collection. No mass effect or midline shift. Parenchymal volume loss is commensurate with the patient's age. Periventricular white matter changes are present likely reflecting the sequela of small vessel ischemia. ORBITS: No acute abnormality. SINUSES: Mastoid air cells and middle ear cavities are clear. SOFT TISSUES AND SKULL: Left frontal scalp laceration approaches the outer table of the calvarium. No skull fracture. IMPRESSION: 1. No acute intracranial abnormality 2. Left frontal scalp laceration approaches the outer table of the calvarium. Electronically signed by: Dorethia Molt MD 04/19/2024 12:18 AM EST RP Workstation: [id number]   CT CERVICAL SPINE WO CONTRAST Result Date: 04/19/2024 EXAM: CT CERVICAL SPINE WITHOUT CONTRAST 04/19/2024 12:01:06 AM TECHNIQUE: CT of the cervical spine was performed without the administration of intravenous contrast. Multiplanar reformatted images are provided for review. Automated exposure control, iterative reconstruction, and/or weight based adjustment of the mA/kV was utilized to reduce the radiation dose to as low as reasonably achievable. COMPARISON: 09/18/2023 CLINICAL HISTORY: Polytrauma, blunt. FINDINGS: CERVICAL SPINE: BONES AND ALIGNMENT: Stable 1 -  2 mm anterolisthesis C3-C4 and C4-C5. Overall straightening of the cervical spine, unchanged from prior examination. 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 No acute fracture or hardware is identified. Craniocervical alignment is normal. Prominent degenerative calcification surrounding the odontoid process without significant associated canal stenosis. Ankylosis of the right facet joint of C3-C4. DEGENERATIVE CHANGES: Multilevel vertebral and facet arthrosis,  particularly advanced asymmetrically on the right with resultant moderate right neural foraminal narrowing at C4-C5 and C5-C6 and left neural foraminal narrowing at C6-C7. Disc space narrowing and endplate remodeling at C4-C7 is present in keeping with changes of moderate-to-severe degenerative disc disease, most severe at C5-C6. No high-grade canal stenosis. SOFT TISSUES: No prevertebral soft tissue swelling. IMPRESSION: 1. No acute fracture or listhesis of the cervical spine. Multilevel degenerative changes as outlined above. Electronically signed by: Dorethia Molt MD 04/19/2024 12:17 AM EST RP Workstation: [id number]    Final Reassessment and Plan:   TDAP updated. Cts reassuring. Patient back to baseline. Family coming to take patient home.  Lac repaired.   Disposition:  I have considered need for hospitalization, however, considering all of the above, I believe this patient is stable for discharge at this time.  Patient/family educated about specific return precautions for given chief complaint and symptoms.  Patient/family educated about follow-up with PCP.     Patient/family expressed understanding of return precautions and need for follow-up. Patient spoken to regarding all imaging and laboratory results and appropriate follow up for these results. All education provided in verbal form with additional information in written form. Time was allowed for answering of patient questions. Patient discharged.    Emergency Department Medication Summary:   Medications  Tdap (ADACEL) injection 0.5 mL (has no administration in time range)  lidocaine -EPINEPHrine (XYLOCAINE  W/EPI) 2 %-1:200000 (PF) injection 10 mL (10 mLs Intradermal Given 04/18/24 2355)            Clinical Impression:  1. Fall, initial encounter      Discharge   Final Clinical Impression(s) / ED Diagnoses Final diagnoses:  Fall, initial encounter    Rx / DC Orders ED Discharge Orders     None          Jerral Meth, MD 04/19/24 [phone]

## 2024-04-19 DIAGNOSIS — M4802 Spinal stenosis, cervical region: Secondary | ICD-10-CM | POA: Diagnosis not present

## 2024-04-19 DIAGNOSIS — M47812 Spondylosis without myelopathy or radiculopathy, cervical region: Secondary | ICD-10-CM | POA: Diagnosis not present

## 2024-04-19 DIAGNOSIS — S199XXA Unspecified injury of neck, initial encounter: Secondary | ICD-10-CM | POA: Diagnosis not present

## 2024-04-19 DIAGNOSIS — S0990XA Unspecified injury of head, initial encounter: Secondary | ICD-10-CM | POA: Diagnosis not present

## 2024-04-19 DIAGNOSIS — M4312 Spondylolisthesis, cervical region: Secondary | ICD-10-CM | POA: Diagnosis not present

## 2024-04-19 MED ORDER — TETANUS-DIPHTH-ACELL PERTUSSIS 5-2-15.5 LF-MCG/0.5 IM SUSP
0.5000 mL | Freq: Once | INTRAMUSCULAR | Status: AC
  Administered 2024-04-19: 0.5 mL via INTRAMUSCULAR
  Filled 2024-04-19: qty 0.5

## 2024-05-06 DIAGNOSIS — K080 Exfoliation of teeth due to systemic causes: Secondary | ICD-10-CM | POA: Diagnosis not present

## 2024-05-17 DIAGNOSIS — R197 Diarrhea, unspecified: Secondary | ICD-10-CM | POA: Diagnosis not present

## 2024-05-17 DIAGNOSIS — K52832 Lymphocytic colitis: Secondary | ICD-10-CM | POA: Diagnosis not present

## 2024-05-17 DIAGNOSIS — K219 Gastro-esophageal reflux disease without esophagitis: Secondary | ICD-10-CM | POA: Diagnosis not present

## 2024-05-19 ENCOUNTER — Ambulatory Visit

## 2024-05-19 VITALS — BP 126/66 | Ht 60.0 in | Wt 141.2 lb

## 2024-05-19 DIAGNOSIS — Z0000 Encounter for general adult medical examination without abnormal findings: Secondary | ICD-10-CM

## 2024-05-19 NOTE — Patient Instructions (Signed)
 Ms. Raffel,  Thank you for taking the time for your Medicare Wellness Visit. I appreciate your continued commitment to your health goals. Please review the care plan we discussed, and feel free to reach out if I can assist you further.  Please note that Annual Wellness Visits do not include a physical exam. Some assessments may be limited, especially if the visit was conducted virtually. If needed, we may recommend an in-person follow-up with your provider.  Ongoing Care Seeing your primary care provider every 3 to 6 months helps us  monitor your health and provide consistent, personalized care.   Referrals If a referral was made during today's visit and you haven't received any updates within two weeks, please contact the referred provider directly to check on the status.  Recommended Screenings:  Health Maintenance  Topic Date Due   Medicare Annual Wellness Visit  12/28/2022   Flu Shot  08/31/2024*   DTaP/Tdap/Td vaccine (4 - Td or Tdap) 04/19/2034   Pneumococcal Vaccine for age over 49  Completed   Osteoporosis screening with Bone Density Scan  Completed   Zoster (Shingles) Vaccine  Completed   Meningitis B Vaccine  Aged Out   Breast Cancer Screening  Discontinued   Colon Cancer Screening  Discontinued   COVID-19 Vaccine  Discontinued   Hepatitis C Screening  Discontinued  *Topic was postponed. The date shown is not the original due date.       09/19/2023   12:00 AM  Advanced Directives  Does Patient Have a Medical Advance Directive? Yes  Type of Advance Directive Living will  Does patient want to make changes to medical advance directive? No - Patient declined    Vision: Annual vision screenings are recommended for early detection of glaucoma, cataracts, and diabetic retinopathy. These exams can also reveal signs of chronic conditions such as diabetes and high blood pressure.  Dental: Annual dental screenings help detect early signs of oral cancer, gum disease, and other  conditions linked to overall health, including heart disease and diabetes.  Please see the attached documents for additional preventive care recommendations.

## 2024-05-19 NOTE — Progress Notes (Signed)
 I connected with  Ana Johnston on 05/19/2024 by a audio enabled telemedicine application and verified that I am speaking with the correct person using two identifiers.  Patient Location: Home  Provider Location: Home Office  Persons Participating in Visit: Patient.  I discussed the limitations of evaluation and management by telemedicine. The patient expressed understanding and agreed to proceed.  Vital Signs: Because this visit was a virtual/telehealth visit, some criteria may be missing or patient reported. Any vitals not documented were not able to be obtained and vitals that have been documented are patient reported.   This visit was performed by a medical professional under my direct supervision. I was immediately available for consultation/collaboration. I have reviewed and agree with the Annual Wellness Visit documentation.  Chief Complaint  Patient presents with   Medicare Wellness    medicare     Subjective:   Ana Johnston is a 78 y.o. female who presents for a Medicare Annual Wellness Visit.  Visit info / Clinical Intake: Medicare Wellness Visit Type:: Initial Annual Wellness Visit Persons participating in visit and providing information:: patient Medicare Wellness Visit Mode:: Telephone If telephone:: video declined Since this visit was completed virtually, some vitals may be partially provided or unavailable. Missing vitals are due to the limitations of the virtual format.: Documented vitals are patient reported If Telephone or Video please confirm:: I connected with patient using audio/video enable telemedicine. I verified patient identity with two identifiers, discussed telehealth limitations, and patient agreed to proceed. Patient Location:: home Provider Location:: home office Interpreter Needed?: No Pre-visit prep was completed: yes AWV questionnaire completed by patient prior to visit?: no Living arrangements:: (!) lives alone Patient's  Overall Health Status Rating: very good Typical amount of pain: none Does pain affect daily life?: no Are you currently prescribed opioids?: no  Dietary Habits and Nutritional Risks How many meals a day?: 2 Eats fruit and vegetables daily?: yes Most meals are obtained by: preparing own meals In the last 2 weeks, have you had any of the following?: none Diabetic:: no  Functional Status Activities of Daily Living (to include ambulation/medication): Independent Ambulation: Independent Medication Administration: Independent Home Management (perform basic housework or laundry): Independent Manage your own finances?: yes Primary transportation is: family / friends; driving Concerns about vision?: no *vision screening is required for WTM* Concerns about hearing?: no  Fall Screening Falls in the past year?: 1 Number of falls in past year: 1 Was there an injury with Fall?: 1 Fall Risk Category Calculator: 3 Patient Fall Risk Level: High Fall Risk  Fall Risk Patient at Risk for Falls Due to: History of fall(s); Impaired balance/gait Fall risk Follow up: Falls evaluation completed; Falls prevention discussed; Education provided  Home and Transportation Safety: All rugs have non-skid backing?: N/A, no rugs All stairs or steps have railings?: N/A, no stairs Grab bars in the bathtub or shower?: yes Have non-skid surface in bathtub or shower?: yes Good home lighting?: yes Regular seat belt use?: yes Hospital stays in the last year:: no  Cognitive Assessment Difficulty concentrating, remembering, or making decisions? : no Will 6CIT or Mini Cog be Completed: yes What year is it?: 0 points What month is it?: 0 points Give patient an address phrase to remember (5 components): remember words apple, table . penny About what time is it?: 0 points Count backwards from 20 to 1: 0 points Say the months of the year in reverse: 0 points Repeat the address phrase from earlier: 0 points 6 CIT  Score: 0 points  Advance Directives (For Healthcare) Does Patient Have a Medical Advance Directive?: Yes Does patient want to make changes to medical advance directive?: No - Patient declined Type of Advance Directive: Healthcare Power of [place]; Living will; Out of facility DNR (pink MOST or yellow form) Copy of Healthcare Power of Attorney in Chart?: No - copy requested Copy of Living Will in Chart?: No - copy requested Out of facility DNR (pink MOST or yellow form) in Chart? (Ambulatory ONLY): No - copy requested  Reviewed/Updated  Reviewed/Updated: Reviewed All (Medical, Surgical, Family, Medications, Allergies, Care Teams, Patient Goals)    Allergies (verified) Patient has no known allergies.   Current Medications (verified) Outpatient Encounter Medications as of 05/19/2024  Medication Sig   acetaminophen  (TYLENOL ) 500 MG tablet Take 2 tablets (1,000 mg total) by mouth every 8 (eight) hours as needed.   albuterol  (VENTOLIN  HFA) 108 (90 Base) MCG/ACT inhaler Inhale 2 puffs into the lungs every 6 (six) hours as needed for wheezing or shortness of breath.   amLODipine  (NORVASC ) 5 MG tablet Take 1 tablet (5 mg total) by mouth daily.   atorvastatin  (LIPITOR) 20 MG tablet Take 1 tablet (20 mg total) by mouth at bedtime as needed.   budesonide  (ENTOCORT EC ) 3 MG 24 hr capsule Take 6 mg by mouth at bedtime.   busPIRone  (BUSPAR ) 5 MG tablet Take 1 tablet (5 mg total) by mouth 2 (two) times daily.   calcium -vitamin D  (OSCAL WITH D) 500-200 MG-UNIT tablet Take 1 tablet by mouth daily with breakfast.   Cholecalciferol  (VITAMIN D3) 1000 units CHEW Chew 1 tablet by mouth daily.   colestipol (COLESTID) 1 g tablet Take 1 g by mouth at bedtime.   hydrOXYzine (ATARAX) 25 MG tablet Take 25 mg by mouth 4 (four) times daily.   levocetirizine (XYZAL ) 5 MG tablet Take 1 tablet (5 mg total) by mouth every evening.   Melatonin 10 MG TABS Take 20 mg by mouth at bedtime as needed (sleep).   meloxicam   (MOBIC ) 15 MG tablet Take 1 tablet (15 mg total) by mouth daily as needed for pain.   montelukast  (SINGULAIR ) 10 MG tablet TAKE 1 TABLET BY MOUTH DAILY   sertraline  (ZOLOFT ) 50 MG tablet Take 1 tablet (50 mg total) by mouth at bedtime.   tiZANidine  (ZANAFLEX ) 4 MG tablet Take 0.5-1 tablets (2-4 mg total) by mouth every 8 (eight) hours as needed for muscle spasms.   TRELEGY ELLIPTA  200-62.5-25 MCG/ACT AEPB Inhale 1 Inhalation into the lungs daily.   No facility-administered encounter medications on file as of 05/19/2024.    History: Past Medical History:  Diagnosis Date   Acute pulmonary embolism without acute cor pulmonale (HCC) 05/17/2022   Formatting of this note might be different from the original. CTA of the Chest (05/13/2022): Small subsegmental right lower lobe pulmonary embolus, likely subacute in age given its relatively peripheral location within the vessel.     Anxiety    Arthritis    Atypical chest pain 04/09/2022   Atypical chest pain     Clostridium difficile diarrhea 05/25/2022   Colitis    COPD (chronic obstructive pulmonary disease) (HCC)    Depression    Diverticulitis    Dyspnea on exertion 04/09/2022   Dyspnea on exertion     History of Clostridioides difficile colitis 09/18/2023   Hypertension    Seasonal allergies    Subcutaneous emphysema due to trauma 09/18/2023   Traumatic fracture of ribs 7-10 of right side with pneumothorax 10/29/2022  Onset Date: [id number]     Traumatic fracture of ribs with pneumothorax 09/18/2023   Past Surgical History:  Procedure Laterality Date   ABDOMINAL HYSTERECTOMY     BACK SURGERY  09/2016   CATARACT EXTRACTION, BILATERAL     CHOLECYSTECTOMY     1966   COLONOSCOPY  09/2022   TONSILLECTOMY AND ADENOIDECTOMY     1965   Family History  Problem Relation Age of Onset   Diabetes Mother    Heart attack Father    Hearing loss Sister    Breast cancer Sister    Hyperlipidemia Brother    Heart attack Brother    Hearing  loss Brother    Hypertension Son    Hyperlipidemia Son    Alcohol abuse Son    Anxiety disorder Son    Social History   Occupational History   Occupation: retired  Tobacco Use   Smoking status: Former    Types: Cigarettes    Passive exposure: Never   Smokeless tobacco: Never   Tobacco comments:    1/2 ppd x 50+ years  Vaping Use   Vaping status: Never Used  Substance and Sexual Activity   Alcohol use: Yes    Comment: 2 glasses of wine a day   Drug use: Never   Sexual activity: Not Currently    Partners: Male   Tobacco Counseling Counseling given: Not Answered Tobacco comments: 1/2 ppd x 50+ years  SDOH Screenings   Food Insecurity: No Food Insecurity (05/19/2024)  Housing: Low Risk (05/19/2024)  Transportation Needs: No Transportation Needs (05/19/2024)  Utilities: Not At Risk (05/19/2024)  Alcohol Screen: Low Risk (01/22/2024)  Depression (PHQ2-9): Low Risk (05/19/2024)  Financial Resource Strain: Low Risk (01/22/2024)  Physical Activity: Sufficiently Active (05/19/2024)  Social Connections: Moderately Isolated (05/19/2024)  Stress: No Stress Concern Present (05/19/2024)  Tobacco Use: Medium Risk (05/19/2024)  Health Literacy: Adequate Health Literacy (05/19/2024)   See flowsheets for full screening details  Depression Screen PHQ 2 & 9 Depression Scale- Over the past 2 weeks, how often have you been bothered by any of the following problems? Little interest or pleasure in doing things: 0 Feeling down, depressed, or hopeless (PHQ Adolescent also includes...irritable): 0 PHQ-2 Total Score: 0 Trouble falling or staying asleep, or sleeping too much: 0 Feeling tired or having little energy: 0 Poor appetite or overeating (PHQ Adolescent also includes...weight loss): 0 Feeling bad about yourself - or that you are a failure or have let yourself or your family down: 0 Trouble concentrating on things, such as reading the newspaper or watching television (PHQ Adolescent  also includes...like school work): 0 Moving or speaking so slowly that other people could have noticed. Or the opposite - being so fidgety or restless that you have been moving around a lot more than usual: 0 Thoughts that you would be better off dead, or of hurting yourself in some way: 0 PHQ-9 Total Score: 0 If you checked off any problems, how difficult have these problems made it for you to do your work, take care of things at home, or get along with other people?: Not difficult at all  Depression Treatment Depression Interventions/Treatment : [id number] Score <4 Follow-up Not Indicated     Goals Addressed               This Visit's Progress     Patient Stated (pt-stated)        Patient would like to enjoy whatever she like doing.  Objective:    Today's Vitals   05/19/24 0826  BP: 126/66  Weight: 141 lb 3.2 oz (64 kg)  Height: 5' (1.524 m)   Body mass index is 27.58 kg/m.  Hearing/Vision screen Hearing Screening - Comments:: No difficulties  Vision Screening - Comments:: Patient wears glasses. Patient is up to date . Patient can npt remember eye doctor or office  Immunizations and Health Maintenance Health Maintenance  Topic Date Due   Medicare Annual Wellness (AWV)  12/28/2022   Influenza Vaccine  08/31/2024 (Originally 01/02/2024)   DTaP/Tdap/Td (4 - Td or Tdap) 04/19/2034   Pneumococcal Vaccine: 50+ Years  Completed   Bone Density Scan  Completed   Zoster Vaccines- Shingrix  Completed   Meningococcal B Vaccine  Aged Out   Mammogram  Discontinued   Colonoscopy  Discontinued   COVID-19 Vaccine  Discontinued   Hepatitis C Screening  Discontinued        Assessment/Plan:  This is a routine wellness examination for Cristabel.  Patient Care Team: Catherine Charlies LABOR, DO as PCP - General (Family Medicine) Lucio Elsie BROCKS, MD as Referring Physician (Gastroenterology) Court Dorn PARAS, MD as Consulting Physician (Cardiology) Tat, Asberry RAMAN, DO as  Consulting Physician (Neurology)  I have personally reviewed and noted the following in the patients chart:   Medical and social history Use of alcohol, tobacco or illicit drugs  Current medications and supplements including opioid prescriptions. Functional ability and status Nutritional status Physical activity Advanced directives List of other physicians Hospitalizations, surgeries, and ER visits in previous 12 months Vitals Screenings to include cognitive, depression, and falls Referrals and appointments  No orders of the defined types were placed in this encounter.  In addition, I have reviewed and discussed with patient certain preventive protocols, quality metrics, and best practice recommendations. A written personalized care plan for preventive services as well as general preventive health recommendations were provided to patient.   Lyle MARLA Right, [place]   05/19/2024   No follow-ups on file.  After Visit Summary: (MyChart) Due to this being a telephonic visit, the after visit summary with patients personalized plan was offered to patient via MyChart   No voiced or noted concerns at this time

## 2024-05-26 ENCOUNTER — Ambulatory Visit

## 2024-06-04 ENCOUNTER — Other Ambulatory Visit: Payer: Self-pay | Admitting: Cardiovascular Disease
